# Patient Record
Sex: Male | Born: 1935 | Hispanic: No | Smoking: Former smoker
Health system: Southern US, Community
[De-identification: ages and names within clinical notes are randomized; demographics above are authoritative.]

---

## 2022-02-09 ENCOUNTER — Other Ambulatory Visit: Payer: Self-pay

## 2022-02-09 ENCOUNTER — Emergency Department (HOSPITAL_COMMUNITY)
Admission: EM | Admit: 2022-02-09 | Discharge: 2022-02-10 | Disposition: A | Discharge status: Home / Self Care | Payer: Medicare Other | Attending: Emergency Medicine | Admitting: Emergency Medicine

## 2022-02-09 ENCOUNTER — Encounter (HOSPITAL_COMMUNITY): Payer: Self-pay | Admitting: Emergency Medicine

## 2022-02-09 DIAGNOSIS — T83010A Breakdown (mechanical) of cystostomy catheter, initial encounter: Secondary | ICD-10-CM

## 2022-02-09 DIAGNOSIS — N3000 Acute cystitis without hematuria: Secondary | ICD-10-CM | POA: Diagnosis not present

## 2022-02-09 DIAGNOSIS — D72829 Elevated white blood cell count, unspecified: Secondary | ICD-10-CM | POA: Diagnosis not present

## 2022-02-09 DIAGNOSIS — R140 Abdominal distension (gaseous): Secondary | ICD-10-CM | POA: Insufficient documentation

## 2022-02-09 DIAGNOSIS — T83028A Displacement of other indwelling urethral catheter, initial encounter: Secondary | ICD-10-CM | POA: Insufficient documentation

## 2022-02-09 DIAGNOSIS — D75839 Thrombocytosis, unspecified: Secondary | ICD-10-CM | POA: Diagnosis not present

## 2022-02-09 NOTE — ED Triage Notes (Addendum)
Patient said his suprapubic catheter has not drained for 2 days. He just told his daughter tonight. Patient is having lower abdominal pain. ?

## 2022-02-09 NOTE — ED Notes (Signed)
Bladder scan 619mL.

## 2022-02-09 NOTE — ED Notes (Signed)
Multiple attempts made to flush patient's suprapubic catheter with sterile water per MD Palumbo.  Unsuccessful, meeting resistance with each flush.  MD Palumbo made aware, currently waiting on urology consult.  ?

## 2022-02-10 ENCOUNTER — Emergency Department (HOSPITAL_COMMUNITY): Payer: Medicare Other

## 2022-02-10 LAB — CBC WITH DIFFERENTIAL/PLATELET
Abs Immature Granulocytes: 0.06 10*3/uL (ref 0.00–0.07)
Basophils Absolute: 0.1 10*3/uL (ref 0.0–0.1)
Basophils Relative: 1 %
Eosinophils Absolute: 0.1 10*3/uL (ref 0.0–0.5)
Eosinophils Relative: 1 %
HCT: 39.1 % (ref 39.0–52.0)
Hemoglobin: 13.7 g/dL (ref 13.0–17.0)
Immature Granulocytes: 1 %
Lymphocytes Relative: 27 %
Lymphs Abs: 3.4 10*3/uL (ref 0.7–4.0)
MCH: 28.3 pg (ref 26.0–34.0)
MCHC: 35 g/dL (ref 30.0–36.0)
MCV: 80.8 fL (ref 80.0–100.0)
Monocytes Absolute: 0.9 10*3/uL (ref 0.1–1.0)
Monocytes Relative: 7 %
Neutro Abs: 8.2 10*3/uL — ABNORMAL HIGH (ref 1.7–7.7)
Neutrophils Relative %: 63 %
Platelets: 546 10*3/uL — ABNORMAL HIGH (ref 150–400)
RBC: 4.84 MIL/uL (ref 4.22–5.81)
RDW: 14.1 % (ref 11.5–15.5)
WBC: 12.7 10*3/uL — ABNORMAL HIGH (ref 4.0–10.5)
nRBC: 0 % (ref 0.0–0.2)

## 2022-02-10 LAB — URINALYSIS, ROUTINE W REFLEX MICROSCOPIC
Bilirubin Urine: NEGATIVE
Glucose, UA: NEGATIVE mg/dL
Ketones, ur: NEGATIVE mg/dL
Nitrite: POSITIVE — AB
Protein, ur: 100 mg/dL — AB
RBC / HPF: >50 RBC/hpf — ABNORMAL HIGH (ref 0–5)
Specific Gravity, Urine: 1.012 (ref 1.005–1.030)
WBC, UA: >50 WBC/hpf — ABNORMAL HIGH (ref 0–5)
pH: 6 (ref 5.0–8.0)

## 2022-02-10 LAB — I-STAT CHEM 8, ED
BUN: 16 mg/dL (ref 8–23)
Calcium, Ion: 1.22 mmol/L (ref 1.15–1.40)
Chloride: 99 mmol/L (ref 98–111)
Creatinine, Ser: 1.2 mg/dL (ref 0.61–1.24)
Glucose, Bld: 144 mg/dL — ABNORMAL HIGH (ref 70–99)
HCT: 42 % (ref 39.0–52.0)
Hemoglobin: 14.3 g/dL (ref 13.0–17.0)
Potassium: 3.9 mmol/L (ref 3.5–5.1)
Sodium: 139 mmol/L (ref 135–145)
TCO2: 28 mmol/L (ref 22–32)

## 2022-02-10 MED ORDER — CEFTRIAXONE SODIUM 1 G IJ SOLR
1.0000 g | Freq: Once | INTRAMUSCULAR | Status: AC
  Administered 2022-02-10: 1 g via INTRAMUSCULAR
  Filled 2022-02-10: qty 10

## 2022-02-10 MED ORDER — CEPHALEXIN 500 MG PO CAPS: 500.0000 mg | ORAL_CAPSULE | Freq: Four times a day (QID) | ORAL | 0 refills | Status: DC

## 2022-02-10 NOTE — ED Notes (Signed)
Patient given leg bags and ambulated with a steady gait at d/c.  Patient verbalized understanding on catheter care, and family at bedside verbalized understanding as well.  ?

## 2022-02-10 NOTE — ED Notes (Addendum)
Nurse Atlee Abide from [facility] came to change suprapubic. Patient tolerated change. Cloudy sediment in urine upon urine return. Patient cleaned up and urine sent. ?

## 2022-02-10 NOTE — ED Notes (Signed)
Lab called and agreed to add on urine culture to urine they already have.  ?

## 2022-02-10 NOTE — ED Provider Notes (Signed)
?[facility]-EMERGENCY DEPT ?Provider Note ? ? ?CSN: [id number] ?Arrival date & time: 02/09/22  2307 ? ?  ? ?History ? ?Chief Complaint  ?Patient presents with  ? Suprapubic Catheter Problem  ? ? ?Patrick ApaKenneth C [name] is a 87 y.o. male. ? ?The history is provided by the patient and a relative.  ?Illness ?Location:  Suprapubic catheter ?Quality:  Not draining x 2 days ?Severity:  Moderate ?Onset quality:  Gradual ?Duration:  2 days ?Timing:  Constant ?Progression:  Worsening ?Chronicity:  Recurrent ?Context:  Has had catheter for over a year ?Relieved by:  Nothing ?Worsened by:  Time ?Ineffective treatments:  None tried ?Associated symptoms: no abdominal pain, no fever and no wheezing   ?Risk factors:  Elderly ? ?  ? ?Home Medications ?Prior to Admission medications   ?Medication Sig Start Date End Date Taking? Authorizing Provider  ?cephALEXin (KEFLEX) 500 MG capsule Take 1 capsule (500 mg total) by mouth 4 (four) times daily. 02/10/22  Yes Kylena Mole, MD  ?   ? ?Allergies    ?Patient has no known allergies.   ? ?Review of Systems   ?Review of Systems  ?Constitutional:  Negative for fever.  ?HENT:  Positive for facial swelling.   ?Eyes:  Negative for redness.  ?Respiratory:  Negative for wheezing.   ?Cardiovascular:  Negative for leg swelling.  ?Gastrointestinal:  Negative for abdominal pain.  ?Genitourinary:  Positive for difficulty urinating.  ?Musculoskeletal:  Negative for arthralgias.  ?Psychiatric/Behavioral:  Negative for agitation.   ? ?Physical Exam ?Updated Vital Signs ?BP (!) 141/81   Pulse 72   Temp 97.9 ?F (36.6 ?C) (Oral)   Resp 16   Ht 5\' 8"  (1.727 m)   Wt 73.5 kg   SpO2 100%   BMI 24.63 kg/m?  ?Physical Exam ?Vitals and nursing note reviewed. Exam conducted with a chaperone present.  ?Constitutional:   ?   General: He is not in acute distress. ?   Appearance: Normal appearance.  ?HENT:  ?   Head: Normocephalic and atraumatic.  ?   Nose: Nose normal.  ?Eyes:  ?    Conjunctiva/sclera: Conjunctivae normal.  ?   Pupils: Pupils are equal, round, and reactive to light.  ?Cardiovascular:  ?   Rate and Rhythm: Normal rate and regular rhythm.  ?   Pulses: Normal pulses.  ?   Heart sounds: Normal heart sounds.  ?Pulmonary:  ?   Effort: Pulmonary effort is normal.  ?   Breath sounds: Normal breath sounds.  ?Abdominal:  ?   General: Bowel sounds are normal. There is distension.  ?   Tenderness: There is no abdominal tenderness.  ?Musculoskeletal:     ?   General: Normal range of motion.  ?   Cervical back: Normal range of motion and neck supple.  ?Skin: ?   General: Skin is warm and dry.  ?   Capillary Refill: Capillary refill takes less than 2 seconds.  ?Neurological:  ?   General: No focal deficit present.  ?   Mental Status: He is alert and oriented to person, place, and time.  ?   Deep Tendon Reflexes: Reflexes normal.  ?Psychiatric:     ?   Mood and Affect: Mood normal.     ?   Behavior: Behavior normal.  ? ? ?ED Results / Procedures / Treatments   ?Labs ?(all labs ordered are listed, but only abnormal results are displayed) ?Results for orders placed or performed during the hospital encounter of 02/09/22  ?  CBC with Differential/Platelet  ?Result Value Ref Range  ? WBC 12.7 (H) 4.0 - 10.5 K/uL  ? RBC 4.84 4.22 - 5.81 MIL/uL  ? Hemoglobin 13.7 13.0 - 17.0 g/dL  ? HCT 39.1 39.0 - 52.0 %  ? MCV 80.8 80.0 - 100.0 fL  ? MCH 28.3 26.0 - 34.0 pg  ? MCHC 35.0 30.0 - 36.0 g/dL  ? RDW 14.1 11.5 - 15.5 %  ? Platelets 546 (H) 150 - 400 K/uL  ? nRBC 0.0 0.0 - 0.2 %  ? Neutrophils Relative % 63 %  ? Neutro Abs 8.2 (H) 1.7 - 7.7 K/uL  ? Lymphocytes Relative 27 %  ? Lymphs Abs 3.4 0.7 - 4.0 K/uL  ? Monocytes Relative 7 %  ? Monocytes Absolute 0.9 0.1 - 1.0 K/uL  ? Eosinophils Relative 1 %  ? Eosinophils Absolute 0.1 0.0 - 0.5 K/uL  ? Basophils Relative 1 %  ? Basophils Absolute 0.1 0.0 - 0.1 K/uL  ? Immature Granulocytes 1 %  ? Abs Immature Granulocytes 0.06 0.00 - 0.07 K/uL  ?Urinalysis, Routine  w reflex microscopic Urine, Suprapubic  ?Result Value Ref Range  ? Color, Urine YELLOW YELLOW  ? APPearance CLOUDY (A) CLEAR  ? Specific Gravity, Urine 1.012 1.005 - 1.030  ? pH 6.0 5.0 - 8.0  ? Glucose, UA NEGATIVE NEGATIVE mg/dL  ? Hgb urine dipstick MODERATE (A) NEGATIVE  ? Bilirubin Urine NEGATIVE NEGATIVE  ? Ketones, ur NEGATIVE NEGATIVE mg/dL  ? Protein, ur 100 (A) NEGATIVE mg/dL  ? Nitrite POSITIVE (A) NEGATIVE  ? Leukocytes,Ua LARGE (A) NEGATIVE  ? RBC / HPF >50 (H) 0 - 5 RBC/hpf  ? WBC, UA >50 (H) 0 - 5 WBC/hpf  ? Bacteria, UA RARE (A) NONE SEEN  ? Squamous Epithelial / LPF 0-5 0 - 5  ? WBC Clumps PRESENT   ? Mucus PRESENT   ?I-stat chem 8, ED (not at [facility] or [facility])  ?Result Value Ref Range  ? Sodium 139 135 - 145 mmol/L  ? Potassium 3.9 3.5 - 5.1 mmol/L  ? Chloride 99 98 - 111 mmol/L  ? BUN 16 8 - 23 mg/dL  ? Creatinine, Ser 1.20 0.61 - 1.24 mg/dL  ? Glucose, Bld 144 (H) 70 - 99 mg/dL  ? Calcium, Ion 1.22 1.15 - 1.40 mmol/L  ? TCO2 28 22 - 32 mmol/L  ? Hemoglobin 14.3 13.0 - 17.0 g/dL  ? HCT 42.0 39.0 - 52.0 %  ? ?DG Abdomen 1 View ? ?Result Date: 02/10/2022 ?CLINICAL DATA:  Suprapubic catheter change EXAM: ABDOMEN - 1 VIEW COMPARISON:  None. FINDINGS: Scattered large and small bowel gas is noted. No abnormal mass or abnormal calcifications are seen. No acute bony abnormality is noted. IMPRESSION: No acute abnormality noted. Electronically Signed   By: Alcide Clever M.D.   On: 02/10/2022 02:01   ? ?EKG ?None ? ?Radiology ?DG Abdomen 1 View ? ?Result Date: 02/10/2022 ?CLINICAL DATA:  Suprapubic catheter change EXAM: ABDOMEN - 1 VIEW COMPARISON:  None. FINDINGS: Scattered large and small bowel gas is noted. No abnormal mass or abnormal calcifications are seen. No acute bony abnormality is noted. IMPRESSION: No acute abnormality noted. Electronically Signed   By: Alcide Clever M.D.   On: 02/10/2022 02:01   ? ?Procedures ?Procedures  ? ? ?Medications Ordered in ED ?Medications  ?cefTRIAXone (ROCEPHIN) injection 1 g (1  g Intramuscular Given 02/10/22 0209)  ? ? ?ED Course/ Medical Decision Making/ A&P ?  ?                        ?  Medical Decision Making ?Suprapubic catheter for unknown reason, not drainagin x 2 days  ? ?Amount and/or Complexity of Data Reviewed ?Independent Historian:  ?   Details: daughter see above ?External Data Reviewed: notes. ?   Details: documents on patient has nothing in our system ?Labs: ordered. ?   Details: all labs reviewed by me: normal creatinine 1.2 urinalysis with UTI, slight elevation of white count 12.1, thrombocytosis 546,000 ?Radiology: ordered. ?   Details: KUB without free air ?Discussion of management or test interpretation with external provider(s): Dr. Mena Goes, replace catheter  ? ?Risk ?Prescription drug management. ?Risk Details: Catheter replaced in the ED over 800 cc out.  Will treat for UTI with keflex.  Follow up within a week with urology.  Stable for discharge with close follow up  ? ? ? ?Final Clinical Impression(s) / ED Diagnoses ?Final diagnoses:  ?Suprapubic catheter (HCC)  ?Acute cystitis without hematuria  ? ?Return for intractable cough, coughing up blood, fevers > 100.4 unrelieved by medication, shortness of breath, intractable vomiting, chest pain, shortness of breath, weakness, numbness, changes in speech, facial asymmetry, abdominal pain, passing out, Inability to tolerate liquids or food, cough, altered mental status or any concerns. No signs of systemic illness or infection. The patient is nontoxic-appearing on exam and vital signs are within normal limits.  ?I have reviewed the triage vital signs and the nursing notes. Pertinent labs & imaging results that were available during my care of the patient were reviewed by me and considered in my medical decision making (see chart for details). After history, exam, and medical workup I feel the patient has been appropriately medically screened and is safe for discharge home. Pertinent diagnoses were discussed with the  patient. Patient was given return precautions.  ?Rx / DC Orders ?ED Discharge Orders   ? ?      Ordered  ?  cephALEXin (KEFLEX) 500 MG capsule  4 times daily       ? 02/10/22 0206  ? ?  ?  ? ?  ? ? ?  ?Zhi Geier, MD ?04/0

## 2022-02-11 LAB — URINE CULTURE
Culture: >=100000 — AB
Special Requests: NORMAL

## 2022-04-19 ENCOUNTER — Other Ambulatory Visit: Payer: Self-pay

## 2022-04-19 ENCOUNTER — Emergency Department (HOSPITAL_COMMUNITY)
Admission: EM | Admit: 2022-04-19 | Discharge: 2022-04-20 | Disposition: A | Discharge status: Home / Self Care | Payer: Medicare Other | Attending: Emergency Medicine | Admitting: Emergency Medicine

## 2022-04-19 ENCOUNTER — Encounter (HOSPITAL_COMMUNITY): Payer: Self-pay | Admitting: Emergency Medicine

## 2022-04-19 DIAGNOSIS — T83098A Other mechanical complication of other indwelling urethral catheter, initial encounter: Secondary | ICD-10-CM | POA: Insufficient documentation

## 2022-04-19 DIAGNOSIS — T83010A Breakdown (mechanical) of cystostomy catheter, initial encounter: Secondary | ICD-10-CM

## 2022-04-19 DIAGNOSIS — N3001 Acute cystitis with hematuria: Secondary | ICD-10-CM | POA: Insufficient documentation

## 2022-04-19 DIAGNOSIS — D72829 Elevated white blood cell count, unspecified: Secondary | ICD-10-CM | POA: Diagnosis not present

## 2022-04-19 DIAGNOSIS — R1030 Lower abdominal pain, unspecified: Secondary | ICD-10-CM | POA: Diagnosis present

## 2022-04-19 NOTE — ED Notes (Signed)
Bladder scan showed 77mL of fluid

## 2022-04-19 NOTE — ED Notes (Signed)
Unsuccessful lab draw x2 in right hand. Triage RN aware.

## 2022-04-19 NOTE — ED Provider Triage Note (Addendum)
Emergency Medicine Provider Triage Evaluation Note  Patrick [name] , a 87 y.o. male  was evaluated in triage.  Pt complains of decreased urination.  Patient reports that he has indwelling catheter and has had 1 for the last 3 to 4 years.  When I asked the patient why he has indwelling catheter he tells a long winding story about receiving it after an accident at a nursing facility.  Patient unable to elaborate or provide more details on this.  Patient reports that for the last 1 day he has had pain when attempting to insert catheter into himself.  Patient denies any blood in urine.  Patient denies any fevers.  Patient endorsing lower abdominal pain. Patient bladder scan in triage shows 68mL.  Review of Systems  Positive:  Negative:   Physical Exam  BP (!) 164/75 (BP Location: Right Arm)   Pulse 91   Temp 98.9 F (37.2 C) (Oral)   Resp 17   Ht 5\' 8"  (1.727 m)   Wt 72.6 kg   SpO2 100%   BMI 24.33 kg/m  Gen:   Awake, no distress   Resp:  Normal effort  MSK:   Moves extremities without difficulty  Other:  Distended lower abdomen  Medical Decision Making  Medically screening exam initiated at 8:15 PM.  Appropriate orders placed.  Patrick [name] was informed that the remainder of the evaluation will be completed by another provider, this initial triage assessment does not replace that evaluation, and the importance of remaining in the ED until their evaluation is complete.         Azucena Cecil, PA-C 04/19/22 2031

## 2022-04-19 NOTE — ED Notes (Signed)
Attempted to draw labs x2 unsuccessful. °

## 2022-04-19 NOTE — ED Triage Notes (Signed)
  Patient comes in with lower abdominal pain that has been going on for about a week.  Patient has indwelling urinary catheter that he has been changing for about 3-4 years.  Tried to change catheter yesterday but was hurting too bad.  Patient states it has been draining appropriately but hurts.  Pain 10/10.

## 2022-04-20 LAB — CBC
HCT: 40.6 % (ref 39.0–52.0)
Hemoglobin: 14.1 g/dL (ref 13.0–17.0)
MCH: 29 pg (ref 26.0–34.0)
MCHC: 34.7 g/dL (ref 30.0–36.0)
MCV: 83.5 fL (ref 80.0–100.0)
Platelets: 457 10*3/uL — ABNORMAL HIGH (ref 150–400)
RBC: 4.86 MIL/uL (ref 4.22–5.81)
RDW: 14 % (ref 11.5–15.5)
WBC: 17.3 10*3/uL — ABNORMAL HIGH (ref 4.0–10.5)
nRBC: 0 % (ref 0.0–0.2)

## 2022-04-20 LAB — BASIC METABOLIC PANEL
Anion gap: 9 (ref 5–15)
BUN: 24 mg/dL — ABNORMAL HIGH (ref 8–23)
CO2: 25 mmol/L (ref 22–32)
Calcium: 9.8 mg/dL (ref 8.9–10.3)
Chloride: 103 mmol/L (ref 98–111)
Creatinine, Ser: 1.49 mg/dL — ABNORMAL HIGH (ref 0.61–1.24)
GFR, Estimated: 46 mL/min — ABNORMAL LOW (ref >60)
Glucose, Bld: 165 mg/dL — ABNORMAL HIGH (ref 70–99)
Potassium: 4.3 mmol/L (ref 3.5–5.1)
Sodium: 137 mmol/L (ref 135–145)

## 2022-04-20 LAB — URINALYSIS, ROUTINE W REFLEX MICROSCOPIC
Bilirubin Urine: NEGATIVE
Glucose, UA: NEGATIVE mg/dL
Ketones, ur: NEGATIVE mg/dL
Nitrite: POSITIVE — AB
Protein, ur: 100 mg/dL — AB
Specific Gravity, Urine: 1.012 (ref 1.005–1.030)
WBC, UA: >50 WBC/hpf — ABNORMAL HIGH (ref 0–5)
pH: 6 (ref 5.0–8.0)

## 2022-04-20 MED ORDER — CEFUROXIME AXETIL 250 MG PO TABS: 250.0000 mg | ORAL_TABLET | Freq: Two times a day (BID) | ORAL | 0 refills | Status: AC

## 2022-04-20 NOTE — ED Provider Notes (Signed)
BLADDER CATHETERIZATION  Date/Time: 04/20/2022 1:19 AM  Performed by: Tanvi Gatling, MD Authorized by: Alenna Russell, MD   Consent:    Consent obtained:  Verbal   Consent given by:  Patient   Risks, benefits, and alternatives were discussed: yes   Universal protocol:    Procedure explained and questions answered to patient or proxy's satisfaction: yes     Required blood products, implants, devices, and special equipment available: yes     Immediately prior to procedure, a time out was called: yes     Patient identity confirmed:  Arm band Pre-procedure details:    Procedure purpose:  Therapeutic   Preparation: Patient was prepped and draped in usual sterile fashion   Anesthesia:    Anesthesia method:  None Procedure details:    Provider performed due to:  Altered anatomy   Altered anatomy details: suprapubic catheter.   Catheter insertion:  Indwelling   Catheter type:  Foley   Catheter size:  20 Fr   Bladder irrigation: no     Number of attempts:  1   Urine characteristics:  Cloudy Post-procedure details:    Procedure completion:  Tolerated well, no immediate complications Comments:     Obstructed indwelling catheter removed prior to placement of new catheter.     Giulian Goldring, Jonny Ruiz, MD 04/20/22 [phone]

## 2022-04-20 NOTE — ED Provider Notes (Signed)
[facility]-EMERGENCY DEPT Provider Note   CSN: [id number] Arrival date & time: 04/19/22  1853     History  Chief Complaint  Patient presents with   Abdominal Pain    Patrick [name] is a 87 y.o. male.  HPI  With medical history including suprapubic catheter presents emerged part with complaints of abdominal pain started yesterday, pain is persistent, remains in the lower abdomen does not radiate, endorses that he has been having leaking around his suprapubic catheter, states that he has prostate problems is when he has a suprapubic catheter, states that he has had no urine in it since yesterday, as he was draining around his catheter itself, he denies any nausea or vomiting, states that he cannot remember the last time he had a bowel movement unclear if he is passed gas, he has no other complaints.  Denies any fevers chills general body aches.  Son at bedside able to validate HPI.   Home Medications Prior to Admission medications   Medication Sig Start Date End Date Taking? Authorizing Provider  cefUROXime (CEFTIN) 250 MG tablet Take 1 tablet (250 mg total) by mouth 2 (two) times daily with a meal for 7 days. 04/20/22 04/27/22 Yes Carroll Sage, PA-C  metFORMIN (GLUCOPHAGE) 1000 MG tablet Take 1,000 mg by mouth 2 (two) times daily. 01/05/22  Yes [provider]  TRADJENTA 5 MG TABS tablet Take 5 mg by mouth daily. 01/05/22  Yes [provider]  cephALEXin (KEFLEX) 500 MG capsule Take 1 capsule (500 mg total) by mouth 4 (four) times daily. Patient not taking: Reported on 04/20/2022 02/10/22   Nicanor Alcon, April, MD      Allergies    Patient has no known allergies.    Review of Systems   Review of Systems  Constitutional:  Negative for chills and fever.  Respiratory:  Negative for shortness of breath.   Cardiovascular:  Negative for chest pain.  Gastrointestinal:  Positive for abdominal pain. Negative for nausea, rectal pain and vomiting.   Genitourinary:  Positive for difficulty urinating.  Neurological:  Negative for headaches.    Physical Exam Updated Vital Signs BP (!) 161/92   Pulse 83   Temp 98.6 F (37 C) (Oral)   Resp 18   Ht 5\' 8"  (1.727 m)   Wt 72.6 kg   SpO2 100%   BMI 24.33 kg/m  Physical Exam Vitals and nursing note reviewed.  Constitutional:      General: He is not in acute distress.    Appearance: He is not ill-appearing.  HENT:     Head: Normocephalic and atraumatic.     Nose: No congestion.  Eyes:     Conjunctiva/sclera: Conjunctivae normal.  Cardiovascular:     Rate and Rhythm: Normal rate and regular rhythm.     Pulses: Normal pulses.  Pulmonary:     Effort: Pulmonary effort is normal.  Abdominal:     General: There is distension.     Palpations: Abdomen is soft.     Tenderness: There is abdominal tenderness. There is no right CVA tenderness or left CVA tenderness.     Comments: Suprapubic catheter in place, without surrounding erythema no drainage or discharge present, there was distention noted in the suprapubic region, tender to the touch, with bulging mass present, there is no guarding rebound tenderness or peritoneal sign negative Murphy sign McBurney point.  Skin:    General: Skin is warm and dry.  Neurological:     Mental Status: He  is alert.  Psychiatric:        Mood and Affect: Mood normal.     ED Results / Procedures / Treatments   Labs (all labs ordered are listed, but only abnormal results are displayed) Labs Reviewed  CBC - Abnormal; Notable for the following components:      Result Value   WBC 17.3 (*)    Platelets 457 (*)    All other components within normal limits  BASIC METABOLIC PANEL - Abnormal; Notable for the following components:   Glucose, Bld 165 (*)    BUN 24 (*)    Creatinine, Ser 1.49 (*)    GFR, Estimated 46 (*)    All other components within normal limits  URINALYSIS, ROUTINE W REFLEX MICROSCOPIC - Abnormal; Notable for the following  components:   APPearance CLOUDY (*)    Hgb urine dipstick MODERATE (*)    Protein, ur 100 (*)    Nitrite POSITIVE (*)    Leukocytes,Ua LARGE (*)    WBC, UA >50 (*)    Bacteria, UA RARE (*)    All other components within normal limits  URINE CULTURE    EKG None  Radiology No results found.  Procedures Procedures    Medications Ordered in ED Medications - No data to display  ED Course/ Medical Decision Making/ A&P                           Medical Decision Making Amount and/or Complexity of Data Reviewed Labs: ordered.   This patient presents to the ED for concern of lower abdominal pain, this involves an extensive number of treatment options, and is a complaint that carries with it a high risk of complications and morbidity.  The differential diagnosis includes bowel obstruction, diverticulitis, urinary obstruction    Additional history obtained:  Additional history obtained from son at bedside External records from outside source obtained and reviewed including previous ED note   Co morbidities that complicate the patient evaluation  Suprapubic catheter  Social Determinants of Health:  Geriatric    Lab Tests:  I Ordered, and personally interpreted labs.  The pertinent results include: CBC shows slight leukocytosis of 17.3, BMP shows glucose of 165 BUN 24 creatinine 1.49 baseline appears to be 1.2 GFR 46, UA shows nitrates leukocytes white blood cells rare bacteria urine culture pending   Imaging Studies ordered:  I ordered imaging studies including N/A I independently visualized and interpreted imaging which showed N/A I agree with the radiologist interpretation   Cardiac Monitoring:  The patient was maintained on a cardiac monitor.  I personally viewed and interpreted the cardiac monitored which showed an underlying rhythm of: N/A   Medicines ordered and prescription drug management:  I ordered medication including N/A I have reviewed the  patients home medicines and have made adjustments as needed  Critical Interventions:  N/A   Reevaluation:  Presents with lower abdominal pain on exam he had a bulging mass I suspect likely urinary obstruction, bladder scan showed 681 mL of fluid, suprapubic catheter was removed without difficulty, new catheter was inserted, is draining without difficulty.  Patient notes he feels much better.  Will await lab work.  UA seems consistent with UTI, also correlates with a white count of 17,000, will culture urine and start him on antibiotics.  Patient has drained out a total of 900 cc of fluid, abdomen was soft nontender states he feels better is ready for discharge.  Updated  patient as well as son on lab work they are agreement this plan and they are ready for discharge.   Consultations Obtained:  N/A    Test Considered:  CT and pelvis-this will be deferred to my suspicion for intra-abdominal abnormality is very low at this time, he has a nonsurgical abdomen presentation seems most consistent with bladder obstruction.    Rule out I have low suspicion for liver or gallbladder abnormality as he has no right upper quadrant tenderness.   Low suspicion for ruptured stomach ulcer as she has no peritoneal sign present on exam.  Low suspicion for bowel obstruction as abdomen is nondistended normal bowel sounds.  Low suspicion for complicated diverticulitis as she is nontoxic-appearing, vital signs reassuring.  I have low suspicion for UTI, pyelo-, kidney stone he has no flank tenderness, no CVA tenderness, abdomen soft nontender he is nontoxic-appearing.    Dispostion and problem list  After consideration of the diagnostic results and the patients response to treatment, I feel that the patent would benefit from discharge.  UTI-urine cultures are pending this time, will start him on antibiotics.  We will have him follow-up with urology for further evaluation and strict return  precautions.            Final Clinical Impression(s) / ED Diagnoses Final diagnoses:  Acute cystitis with hematuria  Suprapubic catheter dysfunction, initial encounter [facility])    Rx / DC Orders ED Discharge Orders          Ordered    cefUROXime (CEFTIN) 250 MG tablet  2 times daily with meals        04/20/22 0239              Carroll Sage, PA-C 04/20/22 0240    Molpus, Jonny Ruiz, MD 04/20/22 [phone]

## 2022-04-20 NOTE — Discharge Instructions (Signed)
Lab work shows that you likely have a UTI have started him on antibiotics please take as prescribed  Please follow-up with his urologist for further evaluation  Come back to the emergency department if you develop chest pain, shortness of breath, severe abdominal pain, uncontrolled nausea, vomiting, diarrhea.

## 2022-04-21 LAB — URINE CULTURE

## 2022-10-28 ENCOUNTER — Emergency Department (HOSPITAL_COMMUNITY)
Admission: EM | Admit: 2022-10-28 | Discharge: 2022-10-28 | Discharge status: Against Medical Advice | Payer: Medicare Other | Attending: Emergency Medicine | Admitting: Emergency Medicine

## 2022-10-28 ENCOUNTER — Encounter (HOSPITAL_COMMUNITY): Payer: Self-pay | Admitting: *Deleted

## 2022-10-28 ENCOUNTER — Other Ambulatory Visit: Payer: Self-pay

## 2022-10-28 DIAGNOSIS — Z5329 Procedure and treatment not carried out because of patient's decision for other reasons: Secondary | ICD-10-CM | POA: Insufficient documentation

## 2022-10-28 DIAGNOSIS — R109 Unspecified abdominal pain: Secondary | ICD-10-CM | POA: Diagnosis present

## 2022-10-28 DIAGNOSIS — Y732 Prosthetic and other implants, materials and accessory gastroenterology and urology devices associated with adverse incidents: Secondary | ICD-10-CM | POA: Insufficient documentation

## 2022-10-28 DIAGNOSIS — R1084 Generalized abdominal pain: Secondary | ICD-10-CM

## 2022-10-28 DIAGNOSIS — T83098A Other mechanical complication of other indwelling urethral catheter, initial encounter: Secondary | ICD-10-CM | POA: Diagnosis not present

## 2022-10-28 DIAGNOSIS — T83010A Breakdown (mechanical) of cystostomy catheter, initial encounter: Secondary | ICD-10-CM

## 2022-10-28 LAB — CBC WITH DIFFERENTIAL/PLATELET
Abs Immature Granulocytes: 0.05 10*3/uL (ref 0.00–0.07)
Basophils Absolute: 0 10*3/uL (ref 0.0–0.1)
Basophils Relative: 0 %
Eosinophils Absolute: 0 10*3/uL (ref 0.0–0.5)
Eosinophils Relative: 0 %
HCT: 43.3 % (ref 39.0–52.0)
Hemoglobin: 15.3 g/dL (ref 13.0–17.0)
Immature Granulocytes: 0 %
Lymphocytes Relative: 26 %
Lymphs Abs: 3.5 10*3/uL (ref 0.7–4.0)
MCH: 29.4 pg (ref 26.0–34.0)
MCHC: 35.3 g/dL (ref 30.0–36.0)
MCV: 83.1 fL (ref 80.0–100.0)
Monocytes Absolute: 1.2 10*3/uL — ABNORMAL HIGH (ref 0.1–1.0)
Monocytes Relative: 9 %
Neutro Abs: 8.6 10*3/uL — ABNORMAL HIGH (ref 1.7–7.7)
Neutrophils Relative %: 65 %
Platelets: ADEQUATE 10*3/uL (ref 150–400)
RBC: 5.21 MIL/uL (ref 4.22–5.81)
RDW: 13.3 % (ref 11.5–15.5)
WBC: 13.4 10*3/uL — ABNORMAL HIGH (ref 4.0–10.5)
nRBC: 0 % (ref 0.0–0.2)

## 2022-10-28 LAB — BASIC METABOLIC PANEL
Anion gap: 9 (ref 5–15)
BUN: 23 mg/dL (ref 8–23)
CO2: 23 mmol/L (ref 22–32)
Calcium: 10.1 mg/dL (ref 8.9–10.3)
Chloride: 105 mmol/L (ref 98–111)
Creatinine, Ser: 1.46 mg/dL — ABNORMAL HIGH (ref 0.61–1.24)
GFR, Estimated: 47 mL/min — ABNORMAL LOW (ref >60)
Glucose, Bld: 149 mg/dL — ABNORMAL HIGH (ref 70–99)
Potassium: 4.2 mmol/L (ref 3.5–5.1)
Sodium: 137 mmol/L (ref 135–145)

## 2022-10-28 LAB — URINALYSIS, ROUTINE W REFLEX MICROSCOPIC
Bilirubin Urine: NEGATIVE
Glucose, UA: NEGATIVE mg/dL
Ketones, ur: NEGATIVE mg/dL
Nitrite: NEGATIVE
Protein, ur: 100 mg/dL — AB
Specific Gravity, Urine: 1.011 (ref 1.005–1.030)
pH: 7 (ref 5.0–8.0)

## 2022-10-28 MED ORDER — CEPHALEXIN 250 MG PO CAPS: 500.0000 mg | ORAL_CAPSULE | Freq: Once | ORAL | Status: DC

## 2022-10-28 NOTE — ED Triage Notes (Signed)
Patient c/o pain and supra pubic cath leaking , states he has had the cath x 1 year.

## 2022-10-28 NOTE — ED Provider Notes (Signed)
[name] EM[facility]Y DE[facility]MENT Provider Note   CSN: [id number] Arrival date & time: 10/28/22  4967     History  Chief Complaint  Patient presents with   Abdominal Pain    [name] is a 87 y.o. male with Hx of urinary retention prostate issues and recurrent suprapubic catheters presenting to the ED with abdominal pain and catheter concern.  Accompanied by his son, who provides most of the history.  Patient's son states he got home from work around 11 PM last night and noticed that the patient had urine on his pants and was complaining of abdominal pain.  States the collection bag was empty.  Per pt's son, has them replaced monthly, most recent being 2 weeks ago.  Denies fever, changes in bowel habits, shortness of breath, N/V, or chest pain.  No other complaints at this time.  The history is provided by the patient and medical records.  Abdominal Pain      Home Medications Prior to Admission medications   Medication Sig Start Date End Date Taking? Authorizing Provider  cephALEXin (KEFLEX) 500 MG capsule Take 1 capsule (500 mg total) by mouth 4 (four) times daily. Patient not taking: Reported on 04/20/2022 02/10/22   Palumbo, April, MD  metFORMIN (GLUCOPHAGE) 1000 MG tablet Take 1,000 mg by mouth 2 (two) times daily. 01/05/22   [provider]  TRADJENTA 5 MG TABS tablet Take 5 mg by mouth daily. 01/05/22   [provider]      Allergies    Patient has no known allergies.    Review of Systems   Review of Systems  Gastrointestinal:  Positive for abdominal pain.    Physical Exam Updated Vital Signs BP 128/71 (BP Location: Right Arm)   Pulse 77   Temp 99.4 F (37.4 C) (Oral)   Resp 14   Ht 5\' 8"  (1.727 m)   Wt 77.1 kg   SpO2 100%   BMI 25.85 kg/m  Physical Exam Vitals and nursing note reviewed.  Constitutional:      General: He is not in acute distress.    Appearance: He is well-developed. He is not ill-appearing,  toxic-appearing or diaphoretic.  HENT:     Head: Normocephalic and atraumatic.  Eyes:     Conjunctiva/sclera: Conjunctivae normal.  Cardiovascular:     Rate and Rhythm: Normal rate and regular rhythm.     Heart sounds: No murmur heard. Pulmonary:     Effort: Pulmonary effort is normal. No respiratory distress.     Breath sounds: Normal breath sounds.  Abdominal:     General: There is distension.     Palpations: Abdomen is soft.     Tenderness: There is abdominal tenderness.     Comments: Suprapubic catheter in place, without surrounding erythema.  No drainage or discharge appreciated.  Mild distention of suprapubic region, TTP.  No guarding, rebound tenderness, or peritoneal sign.  Negative Murphy sign or McBurneys point tenderness.  Musculoskeletal:        General: No swelling.     Cervical back: Neck supple.  Skin:    General: Skin is warm and dry.     Capillary Refill: Capillary refill takes less than 2 seconds.  Neurological:     Mental Status: He is alert.  Psychiatric:        Mood and Affect: Mood normal.    ED Results / Procedures / Treatments   Labs (all labs ordered are listed, but only abnormal results are displayed) Labs  Reviewed  URINALYSIS, ROUTINE W REFLEX MICROSCOPIC  CBC WITH DIFFERENTIAL/PLATELET  BASIC METABOLIC PANEL   EKG None  Radiology No results found.  Procedures Procedures    Medications Ordered in ED Medications - No data to display  ED Course/ Medical Decision Making/ A&P Clinical Course as of 10/28/22 1558  Sat Oct 28, 2022  1039 Creatinine(!): 1.46 Close to baseline [AC]    Clinical Course User Index [AC] Patrick Cobbs, PA-C                           Medical Decision Making Amount and/or Complexity of Data Reviewed Labs:  Decision-making details documented in ED Course.   87 y.o. male presents to the ED for concern of Abdominal Pain     This involves an extensive number of treatment options, and is a complaint that  carries with it a high risk of complications and morbidity.  The emergent differential diagnosis prior to evaluation includes, but is not limited to: urinary obstruction, bowel obstruction, diverticulitis  This is not an exhaustive differential.   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  Notably [facility] Dr. Julian [name] visit 08/10/22 and prior ED visit 04/19/22 for similar complaints.  Additional history from son at bedside "Patient in for monthly catheter change per order from Patrick [name] . Order confirmed for 20 F foley catheter. Patient name and DOB verified. Medications and allergies reviewed. Removed 79mL of sterile water from catheter balloon, removed existing foley catheter without difficulty. Cleaned patient with antiseptic towelette. Instilled uroject for comfort.  Inserted 12F foley catheter through the urethra to the bladder using betadine and sterile technique without difficulties. Instilled 32mL of sterile water into balloon, applied catheter support strap, and attached to foley leg bag. Confirmation of placement with of light yellow urine return noted with new catheter insertion. Administered Keflex 500mg  PO x1 per order from , PA. Provided leg bag per patient request. Patient scheduled for one month nurse visit. "  Lab Tests: I ordered, and personally interpreted labs.  The pertinent results include:   WBC 13.4, nonspecific UA from catheter collection with evidence of possible UTI BMP without electrolyte derangement. Mild elevated creatinine, close to value from 6 months ago 1.49  Urine culture pending  Imaging Studies: None  ED Course / Critical Interventions: Pt overall well-appearing on exam.  Nontoxic nonseptic appearing in NAD.  Sitting comfortably.   Presenting with abdominal pain and suprapubic catheter dysfunction.  Around 11 PM last night patient's son noted there was urine leaking outside the catheter, none collecting in the bag, and patient complaining of abdominal pain.  Hx of suprapubic catheter for 1 year, changed monthly with [facility].  Most recently placed about 2 weeks ago per patient's son, present at bedside provides most of history. Afebrile. Mild abdominal distension with tenderness on palpation.  No flank tenderness.  Hemodynamically stable.  Does not meet SIRS or sepsis criteria.  Plan to check renal function with labwork due with reference to extended urinary blockage.  Labs pending.  Catheter replaced by Dr. Rich Number, see separate procedure note for details.  Pt handled this well without complications. Upon reevaluation, pt reports significant improvement.  Urine sent off for culture.  Discussed importance of following up with urologist within the next week. Attempted to provide antibiotic following  catheter change however discovered pt and his son had eloped.  Disposition: Elopement  This patient was a shared case encounter with my attending, Madilyn Hook, who directly contributed to the proposed treatment course and cosigned this note including patient's presenting symptoms, physical exam, and planned diagnostics and interventions.  Attending physician stated agreement with plan or made changes to plan which were implemented.    This chart was dictated using voice recognition software.  Despite best efforts to proofread, errors can occur which can change the documentation meaning.         Final Clinical Impression(s) / ED Diagnoses Final diagnoses:  Suprapubic catheter dysfunction, initial encounter [employer])    Rx / DC Orders ED Discharge Orders     None         Sandrea Hammond 10/28/22 1605    Tilden Fossa, MD 11/01/22 [phone]

## 2022-10-28 NOTE — ED Provider Triage Note (Signed)
Emergency Medicine Provider Triage Evaluation Note  Patrick [name] , a 87 y.o. male  was evaluated in triage.  Pt complains of urine draining around suprapubic catheter.  Follows with urology at [facility] for urinary retention, has ongoing suprapubic catheter, due for exchange in 1 week.  No urine in the bag all day, abdominal pain secondary to sensation of abdominal fullness.  Urine is draining around suprapubic catheter, soaking his clothing.  Review of Systems  Positive: As above Negative: Fevers or chills  Physical Exam  BP 128/71 (BP Location: Right Arm)   Pulse 77   Temp 99.4 F (37.4 C) (Oral)   Resp 14   SpO2 100%  Gen:   Awake, no distress   Resp:  Normal effort  MSK:   Moves extremities without difficulty  Other:  Urine soaked briefs and pants, urine visibly draining around the suprapubic catheter.  No erythema, induration, redness, or tenderness palpation around the catheter.  Mild abdominal distention and tightness to palpation.  Medical Decision Making  Medically screening exam initiated at 4:01 AM.  Appropriate orders placed.  Patrick [name] was informed that the remainder of the evaluation will be completed by another provider, this initial triage assessment does not replace that evaluation, and the importance of remaining in the ED until their evaluation is complete.  Bladder scan ordered.~ 500 cc in the bladder. Charge RN aware.   This chart was dictated using voice recognition software, Dragon. Despite the best efforts of this provider to proofread and correct errors, errors may still occur which can change documentation meaning.    Paris Lore, PA-C 10/28/22 [phone]

## 2022-10-28 NOTE — ED Notes (Signed)
Provider at bedside inserting suprapubic foley with success. Pt reports abdominal relief.

## 2022-10-28 NOTE — ED Notes (Signed)
[name] stated "he is leaving now". This RN informed him labs are pending, providers would like to treat pt appropriately prior to discharging. [name] stated "I have been waiting for 10 hours, I need to go to work and I haven't slept. We have been to this hospital many times and we have the antibiotics and blood pressure medications. [The patient] doesn't take them anyways and he doesn't drink enough water". This RN re-educated pt's son regarding process and need for antibiotics. [name] then stated "If you don't get me a wheelchair, I'll walk him out myself" then proceeded to take patient to exit.

## 2022-10-28 NOTE — ED Provider Notes (Signed)
SUPRAPUBIC TUBE PLACEMENT  Date/Time: 10/28/2022 8:51 AM  Performed by: Tilden Fossa, MD Authorized by: Tilden Fossa, MD   Consent:    Consent obtained:  Verbal   Consent given by:  Patient   Risks discussed:  Infection and pain Universal protocol:    Patient identity confirmed:  Verbally with patient Sedation:    Sedation type:  None Anesthesia:    Anesthesia method:  None Procedure details:    Complexity:  Simple   Catheter type:  Foley   Catheter size:  20 Fr   Ultrasound guidance: no     Number of attempts:  1   Urine characteristics:  Clear Post-procedure details:    Procedure completion:  Tolerated  Suprapubic catheter placed at site of prior catheter that was not draining.  Placed without difficulty.   Tilden Fossa, MD 10/29/22 [phone]

## 2022-10-28 NOTE — ED Notes (Signed)
Pt moved to room 46 for privacy during procedure.

## 2022-10-28 NOTE — Discharge Instructions (Addendum)
You were evaluated in the emergency department today for your suprapubic Foley catheter.  This was changed in the emergency department today.  Please continue to monitor, and call your urologist to schedule your follow-up appointment.  You were also given 1 tablet of 500mg  Keflex.  Return to the ED for any new or worsening symptoms as discussed.

## 2023-01-18 ENCOUNTER — Emergency Department (HOSPITAL_COMMUNITY)
Admission: EM | Admit: 2023-01-18 | Discharge: 2023-01-18 | Disposition: A | Discharge status: Home / Self Care | Payer: Medicare Other | Attending: Emergency Medicine | Admitting: Emergency Medicine

## 2023-01-18 ENCOUNTER — Other Ambulatory Visit: Payer: Self-pay

## 2023-01-18 ENCOUNTER — Encounter (HOSPITAL_COMMUNITY): Payer: Self-pay

## 2023-01-18 DIAGNOSIS — Z87891 Personal history of nicotine dependence: Secondary | ICD-10-CM | POA: Insufficient documentation

## 2023-01-18 DIAGNOSIS — Y828 Other medical devices associated with adverse incidents: Secondary | ICD-10-CM | POA: Insufficient documentation

## 2023-01-18 DIAGNOSIS — T83090A Other mechanical complication of cystostomy catheter, initial encounter: Secondary | ICD-10-CM | POA: Insufficient documentation

## 2023-01-18 DIAGNOSIS — R339 Retention of urine, unspecified: Secondary | ICD-10-CM | POA: Diagnosis present

## 2023-01-18 DIAGNOSIS — T83510A Infection and inflammatory reaction due to cystostomy catheter, initial encounter: Secondary | ICD-10-CM | POA: Insufficient documentation

## 2023-01-18 DIAGNOSIS — N390 Urinary tract infection, site not specified: Secondary | ICD-10-CM

## 2023-01-18 LAB — URINALYSIS, W/ REFLEX TO CULTURE (INFECTION SUSPECTED)
Bilirubin Urine: NEGATIVE
Glucose, UA: NEGATIVE mg/dL
Ketones, ur: NEGATIVE mg/dL
Nitrite: NEGATIVE
Protein, ur: 100 mg/dL — AB
Specific Gravity, Urine: 1.012 (ref 1.005–1.030)
WBC, UA: >50 WBC/hpf (ref 0–5)
pH: 7 (ref 5.0–8.0)

## 2023-01-18 MED ORDER — CEFDINIR 300 MG PO CAPS
600.0000 mg | ORAL_CAPSULE | Freq: Every day | ORAL | Status: DC
  Administered 2023-01-18: 600 mg via ORAL
  Filled 2023-01-18: qty 2

## 2023-01-18 MED ORDER — CEFPODOXIME PROXETIL 200 MG PO TABS: 200.0000 mg | ORAL_TABLET | Freq: Two times a day (BID) | ORAL | 0 refills | Status: AC

## 2023-01-18 NOTE — ED Triage Notes (Signed)
Arrives POV with family.   Reports suprapubic pain and subjective distention with red tinged urine. Family suspect suprapubic catheter clogged or incorrect positioning.

## 2023-01-18 NOTE — ED Provider Notes (Signed)
[facility] DEPT Provider Note: Georgena Spurling, MD, FACEP  CSN: [id number] MRN: [id number] ARRIVAL: 01/18/23 at [place]: [place]  Urinary Retention   HISTORY OF PRESENT ILLNESS  01/18/23 1:21 AM [name] is a 88 y.o. male with an indwelling suprapubic Foley catheter.  He was brought in by son due to pain and distention around the catheter.  It has been draining scant amount of red-tinged urine.  Family suspects suprapubic catheter is clogged.  The patient rates his pain as a 7 out of 10, worse with palpation or movement.   History reviewed. No pertinent past medical history.  History reviewed. No pertinent surgical history.  History reviewed. No pertinent family history.  Social History   Tobacco Use   Smoking status: Former    Types: Cigarettes   Smokeless tobacco: Former  Substance Use Topics   Alcohol use: Never   Drug use: Never    Prior to Admission medications   Medication Sig Start Date End Date Taking? Authorizing Provider  cefpodoxime (VANTIN) 200 MG tablet Take 1 tablet (200 mg total) by mouth 2 (two) times daily for 7 days. 01/18/23 01/25/23 Yes Kitzia Camus, MD  metFORMIN (GLUCOPHAGE) 1000 MG tablet Take 1,000 mg by mouth 2 (two) times daily. 01/05/22   [provider]  TRADJENTA 5 MG TABS tablet Take 5 mg by mouth daily. 01/05/22   [provider]    Allergies Patient has no known allergies.   REVIEW OF SYSTEMS  Negative except as noted here or in the History of Present Illness.   PHYSICAL EXAMINATION  Initial Vital Signs Blood pressure (!) 151/87, pulse 91, temperature 98.5 F (36.9 C), temperature source Oral, resp. rate 18, height '5\' 8"'$  (1.727 m), weight 77.1 kg, SpO2 99 %.  Examination General: Well-developed, well-nourished male in no acute distress; appearance consistent with age of record HENT: normocephalic; atraumatic Eyes: Arcus senilis bilaterally Neck: supple Heart: regular rate and  rhythm Lungs: clear to auscultation bilaterally Abdomen: soft; distended, tender bladder; suprapubic Foley catheter with minimal drainage into leg bag; bowel sounds present Extremities: No deformity; full range of motion; pulses normal Neurologic: Awake, alert; motor function intact in all extremities and symmetric; no facial droop; hard of hearing Skin: Warm and dry Psychiatric: Normal mood and affect   RESULTS  Summary of this visit's results, reviewed and interpreted by myself:   EKG Interpretation  Date/Time:    Ventricular Rate:    PR Interval:    QRS Duration:   QT Interval:    QTC Calculation:   R Axis:     Text Interpretation:         Laboratory Studies: Results for orders placed or performed during the hospital encounter of 01/18/23 (from the past 24 hour(s))  Urinalysis, w/ Reflex to Culture (Infection Suspected) -Urine, Suprapubic     Status: Abnormal   Collection Time: 01/18/23  1:47 AM  Result Value Ref Range   Specimen Source URINE, SUPRAPUBIC    Color, Urine YELLOW YELLOW   APPearance CLOUDY (A) CLEAR   Specific Gravity, Urine 1.012 1.005 - 1.030   pH 7.0 5.0 - 8.0   Glucose, UA NEGATIVE NEGATIVE mg/dL   Hgb urine dipstick MODERATE (A) NEGATIVE   Bilirubin Urine NEGATIVE NEGATIVE   Ketones, ur NEGATIVE NEGATIVE mg/dL   Protein, ur 100 (A) NEGATIVE mg/dL   Nitrite NEGATIVE NEGATIVE   Leukocytes,Ua LARGE (A) NEGATIVE   RBC / HPF 0-5 0 - 5 RBC/hpf   WBC,  UA >50 0 - 5 WBC/hpf   Bacteria, UA MANY (A) NONE SEEN   Squamous Epithelial / HPF 0-5 0 - 5 /HPF   WBC Clumps PRESENT    Imaging Studies: No results found.  ED COURSE and MDM  Nursing notes, initial and subsequent vitals signs, including pulse oximetry, reviewed and interpreted by myself.  Vitals:   01/18/23 0041 01/18/23 0045  BP: (!) 151/87   Pulse: 91   Resp: 18   Temp: 98.5 F (36.9 C)   TempSrc: Oral   SpO2: 99%   Weight:  77.1 kg  Height:  '5\' 8"'$  (1.727 m)   Medications  cefdinir  (OMNICEF) capsule 600 mg (has no administration in time range)    Bedside bladder scan shows approximately 745 mL of retained urine.  Suprapubic Foley catheter replaced as described below.  Good flow restored.  Urine sent for urinalysis.  2:32 AM Urinalysis consistent with urinary tract infection.  Will place patient on an antibiotic.  PROCEDURES   The balloon on the existing Foley catheter was deflated and the catheter withdrawn from the cystostomy.  Copious urine flowed from the stoma.  Once this flow ceased the stoma and surrounding area were cleaned with Betadine and a new Foley catheter was placed as described below:  BLADDER CATHETERIZATION  Date/Time: 01/18/2023 1:47 AM  Performed by: Amyra Vantuyl, MD Authorized by: Imogine Carvell, MD   Consent:    Consent obtained:  Verbal   Consent given by:  Patient   Risks, benefits, and alternatives were discussed: yes   Universal protocol:    Procedure explained and questions answered to patient or proxy's satisfaction: yes     Required blood products, implants, devices, and special equipment available: yes     Immediately prior to procedure, a time out was called: yes     Patient identity confirmed:  Arm band Pre-procedure details:    Procedure purpose:  Therapeutic   Preparation: Patient was prepped and draped in usual sterile fashion   Anesthesia:    Anesthesia method:  None Procedure details:    Provider performed due to:  Altered anatomy   Altered anatomy details: suprapubic cystostomy.   Catheter insertion:  Indwelling   Catheter type:  Foley   Catheter size:  20 Fr   Bladder irrigation: no     Number of attempts:  1   Urine characteristics:  Mildly cloudy Post-procedure details:    Procedure completion:  Tolerated well, no immediate complications  ED DIAGNOSES     ICD-10-CM   1. Obstructed suprapubic catheter, initial encounter ([facility])  T83.090A     2. Urinary tract infection associated with cystostomy catheter, initial  encounter [facility])  T83.510A    N39.0          Anaily Ashbaugh, Jenny Reichmann, MD 01/18/23 [phone]

## 2023-01-18 NOTE — ED Notes (Signed)
Pt pending discharge following medication administration.

## 2023-01-19 LAB — URINE CULTURE

## 2023-05-05 IMAGING — DX DG ABDOMEN 1V
1 series · 1 of 1 positions shown · non-contrast
Comparison: None.

CLINICAL DATA: Suprapubic catheter change

EXAM:
ABDOMEN - 1 VIEW

[abdomen kub]
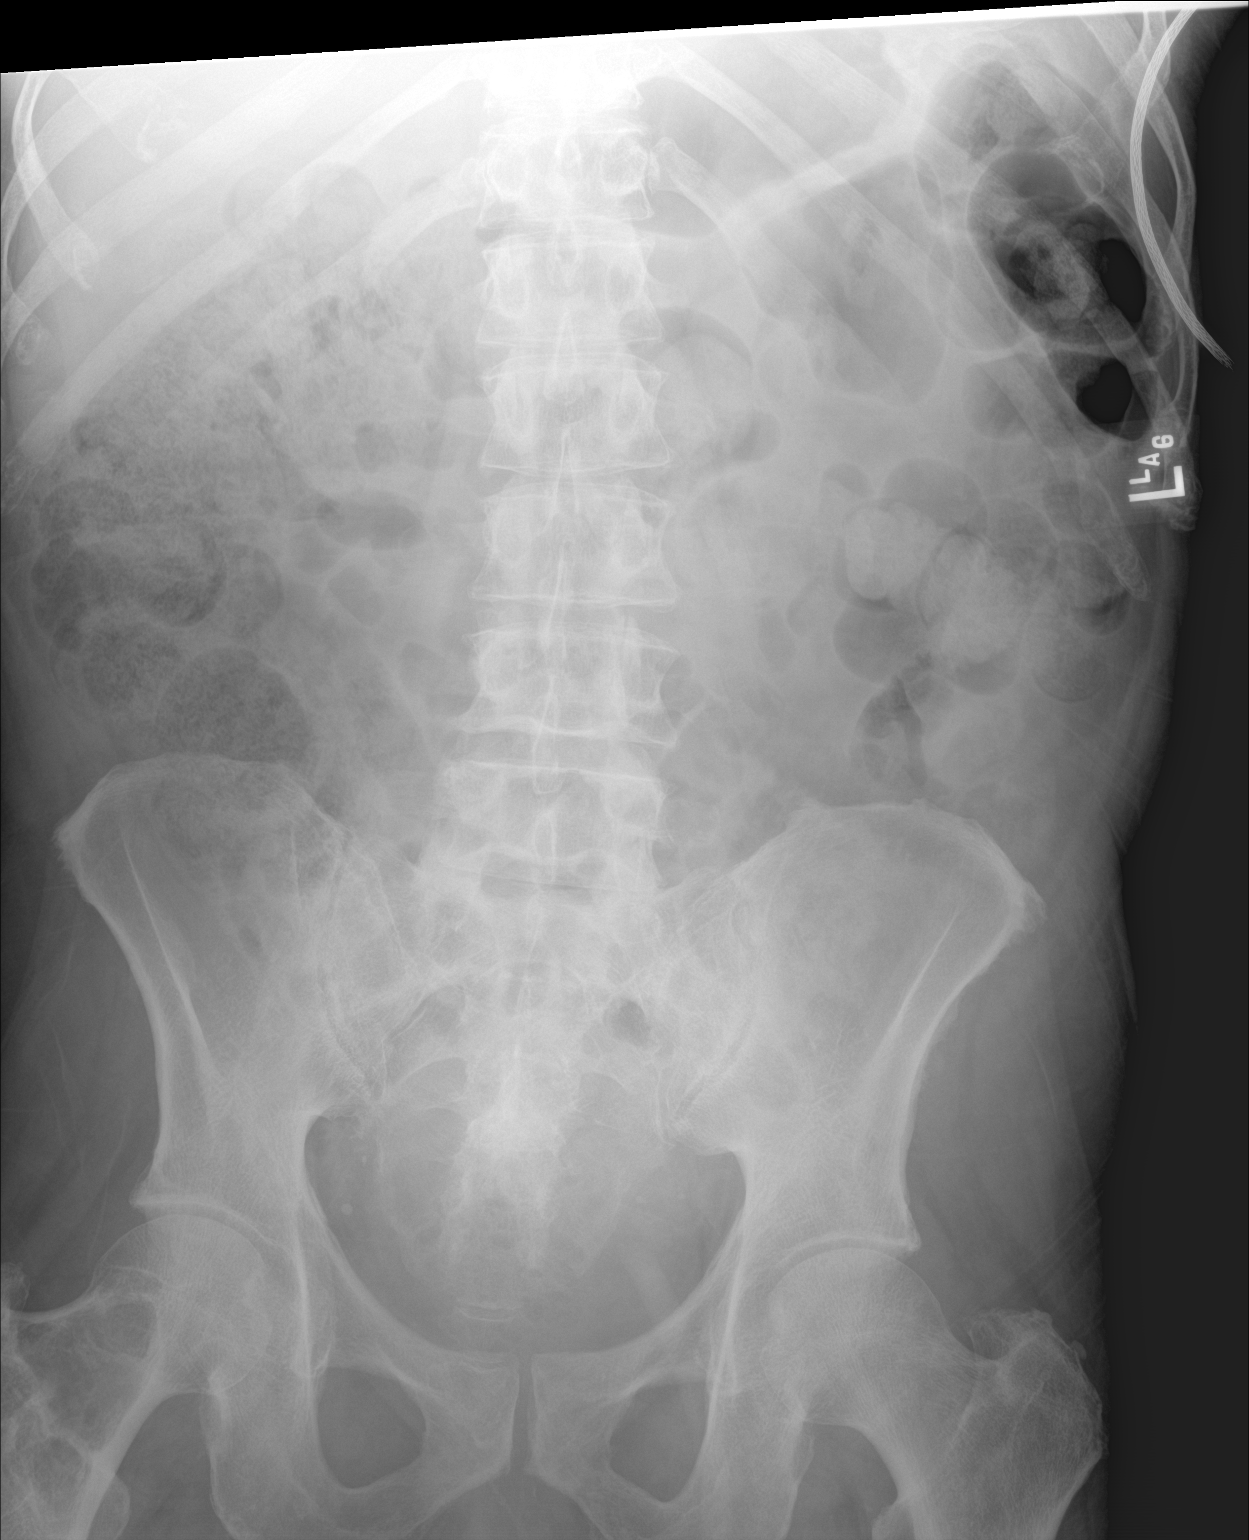

[1 of 1 positions shown; findings below may reference images not displayed]

FINDINGS: Scattered large and small bowel gas is noted. No abnormal mass or
abnormal calcifications are seen. No acute bony abnormality is
noted.
IMPRESSION: No acute abnormality noted.

## 2023-10-23 ENCOUNTER — Emergency Department (HOSPITAL_BASED_OUTPATIENT_CLINIC_OR_DEPARTMENT_OTHER)
Admission: EM | Admit: 2023-10-23 | Discharge: 2023-10-23 | Disposition: A | Discharge status: Home / Self Care | Payer: Medicare Other | Attending: Emergency Medicine | Admitting: Emergency Medicine

## 2023-10-23 ENCOUNTER — Encounter (HOSPITAL_BASED_OUTPATIENT_CLINIC_OR_DEPARTMENT_OTHER): Payer: Self-pay | Admitting: Emergency Medicine

## 2023-10-23 ENCOUNTER — Other Ambulatory Visit: Payer: Self-pay

## 2023-10-23 DIAGNOSIS — R339 Retention of urine, unspecified: Secondary | ICD-10-CM | POA: Diagnosis not present

## 2023-10-23 DIAGNOSIS — R300 Dysuria: Secondary | ICD-10-CM | POA: Diagnosis present

## 2023-10-23 LAB — URINALYSIS, ROUTINE W REFLEX MICROSCOPIC
Bilirubin Urine: NEGATIVE
Glucose, UA: NEGATIVE mg/dL
Ketones, ur: NEGATIVE mg/dL
Nitrite: NEGATIVE
Protein, ur: 100 mg/dL — AB
Specific Gravity, Urine: 1.02 (ref 1.005–1.030)
pH: 8 (ref 5.0–8.0)

## 2023-10-23 LAB — URINALYSIS, MICROSCOPIC (REFLEX)

## 2023-10-23 MED ORDER — CEPHALEXIN 500 MG PO CAPS
500.0000 mg | ORAL_CAPSULE | Freq: Once | ORAL | Status: AC
  Administered 2023-10-23: 500 mg via ORAL
  Filled 2023-10-23: qty 1

## 2023-10-23 MED ORDER — OXYCODONE-ACETAMINOPHEN 5-325 MG PO TABS: 1.0000 | ORAL_TABLET | Freq: Once | ORAL | Status: DC

## 2023-10-23 MED ORDER — CEPHALEXIN 500 MG PO CAPS: 500.0000 mg | ORAL_CAPSULE | Freq: Four times a day (QID) | ORAL | 0 refills | Status: AC

## 2023-10-23 NOTE — ED Notes (Signed)
Greggory Stallion at [facility] notified that patient has been cleared for transport-14:18

## 2023-10-23 NOTE — Consult Note (Signed)
Reason for Consult: Neurogenic Bladder / Clogged SP Tube  Referring Physician: Marguarite Arbour MD  Patrick [name] is an 88 y.o. male.   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 This is recurrent problem. They were denied eval at [facility].   PMH sig for failure to thrive, CAD/Stent, Lives with son at baseline.   Today "Patrick [name]" is seen as urgent consult for above. Several commendable attempts at ER flushing / replacement unsuccessful.     History reviewed. No pertinent past medical history.  History reviewed. No pertinent surgical history.  History reviewed. No pertinent family history.  Social History:  reports that he has quit smoking. His smoking use included cigarettes. He has quit using smokeless tobacco. He reports that he does not drink alcohol and does not use drugs.  Allergies: No Known Allergies  Medications: I have reviewed the patient's current medications.  Results for orders placed or performed during the hospital encounter of 10/23/23 (from the past 48 hours)  Urinalysis, Routine w reflex microscopic -Urine, Suprapubic     Status: Abnormal   Collection Time: 10/23/23 11:37 AM  Result Value Ref Range   Color, Urine STRAW (A) YELLOW   APPearance CLOUDY (A) CLEAR   Specific Gravity, Urine 1.020 1.005 - 1.030   pH 8.0 5.0 - 8.0   Glucose, UA NEGATIVE NEGATIVE mg/dL   Hgb urine dipstick TRACE (A) NEGATIVE   Bilirubin Urine NEGATIVE NEGATIVE   Ketones, ur NEGATIVE NEGATIVE mg/dL   Protein, ur 161 (A) NEGATIVE mg/dL   Nitrite NEGATIVE NEGATIVE   Leukocytes,Ua MODERATE (A) NEGATIVE    Comment: Performed at [facility], [address] [place], [place] [address]  Urinalysis, Microscopic (reflex)     Status: Abnormal   Collection Time: 10/23/23 11:37 AM  Result Value Ref Range   RBC / HPF 0-5 0 - 5 RBC/hpf   WBC, UA  6-10 0 - 5 WBC/hpf   Bacteria, UA MANY (A) NONE SEEN   Squamous Epithelial / HPF 0-5 0 - 5 /HPF   Triple Phosphate Crystal PRESENT     Comment: Performed at [facility], [address] [place], [place], [place] [address]    No results found.  Review of Systems  Genitourinary:  Positive for decreased urine volume.  All other systems reviewed and are negative.  Blood pressure 137/80, pulse 70, temperature 98.2 F (36.8 C), temperature source Oral, resp. rate 18, height 5\' 8"  (1.727 m), weight 77.1 kg, SpO2 98%. Physical Exam Vitals reviewed.  Constitutional:      Comments: Very pleasant, frail. Son at bedside.   Eyes:     Pupils: Pupils are equal, round, and reactive to light.  Cardiovascular:     Rate and Rhythm: Normal rate.  Abdominal:     General: Abdomen is flat.     Comments: Some mild SP distension. SPT partiall in place in what appears abdominal wall displacement.   Genitourinary:    Penis: Normal.   Musculoskeletal:        General: Normal range of motion.     Cervical back: Normal range of motion.  Skin:    General: Skin is warm.  Neurological:     Mental Status: He is alert.  Psychiatric:        Mood and Affect: Mood normal.    BEDSIDE SPT CHANGE:  Verified balloon  deflated with aspiration via balloon port and SPT removed. Numerous calcifications and sediment around eyelets. SPT site prepped with betadine and new 74F SPT placed with copious return cloudy but not foul urine. 10cc sterile water in balloon.   Assessment/Plan:  S/p SP change as per above. Resume monthly changes per [facility] GU provider. Discussed with patient and son strategies to minimize clogging / sediment with daily dilute vinager irrigation (60mL 1:20 white vinager : tap water), taught technique, and given 2 cath-tip syringes.   OK to DC home. Appreciate [facility] and [facility] ER staff.    Loletta Parish. 10/23/2023, 4:19 PM

## 2023-10-23 NOTE — ED Provider Notes (Signed)
[place] EMERGENCY DEPARTMENT AT [facility] Provider Note   CSN: [id number] Arrival date & time: 10/23/23  8657     History  Chief Complaint  Patient presents with   Dysuria    Patrick [name] is a 88 y.o. male.  This is a 88 year old male presents with suprapubic pressure as well as dysuria.  No fever or chills.  No flank pain.  Has a suprapubic catheter secondary to what sounds like urethral stricture.  Notes some urine in his bag but does feel suprapubic pressure.  Last time it was changed was about a month ago      Home Medications Prior to Admission medications   Medication Sig Start Date End Date Taking? Authorizing Provider  metFORMIN (GLUCOPHAGE) 1000 MG tablet Take 1,000 mg by mouth 2 (two) times daily. 01/05/22   [provider]  TRADJENTA 5 MG TABS tablet Take 5 mg by mouth daily. 01/05/22   [provider]      Allergies    Patient has no known allergies.    Review of Systems   Review of Systems  All other systems reviewed and are negative.   Physical Exam Updated Vital Signs BP (!) 147/82 (BP Location: Left Arm)   Pulse 78   Temp 98.4 F (36.9 C) (Oral)   Resp 18   Ht 1.727 m (5\' 8" )   Wt 77.1 kg   SpO2 98%   BMI 25.85 kg/m  Physical Exam Vitals and nursing note reviewed.  Constitutional:      General: He is not in acute distress.    Appearance: Normal appearance. He is well-developed. He is not toxic-appearing.  HENT:     Head: Normocephalic and atraumatic.  Eyes:     General: Lids are normal.     Conjunctiva/sclera: Conjunctivae normal.     Pupils: Pupils are equal, round, and reactive to light.  Neck:     Thyroid: No thyroid mass.     Trachea: No tracheal deviation.  Cardiovascular:     Rate and Rhythm: Normal rate and regular rhythm.     Heart sounds: Normal heart sounds. No murmur heard.    No gallop.  Pulmonary:     Effort: Pulmonary effort is normal. No respiratory distress.     Breath sounds:  Normal breath sounds. No stridor. No decreased breath sounds, wheezing, rhonchi or rales.  Abdominal:     General: There is no distension.     Palpations: Abdomen is soft.     Tenderness: There is abdominal tenderness in the suprapubic area. There is no rebound.    Musculoskeletal:        General: No tenderness. Normal range of motion.     Cervical back: Normal range of motion and neck supple.  Skin:    General: Skin is warm and dry.     Findings: No abrasion or rash.  Neurological:     Mental Status: He is alert and oriented to person, place, and time. Mental status is at baseline.     GCS: GCS eye subscore is 4. GCS verbal subscore is 5. GCS motor subscore is 6.     Cranial Nerves: No cranial nerve deficit.     Sensory: No sensory deficit.     Motor: Motor function is intact.  Psychiatric:        Attention and Perception: Attention normal.        Speech: Speech normal.        Behavior: Behavior normal.  ED Results / Procedures / Treatments   Labs (all labs ordered are listed, but only abnormal results are displayed) Labs Reviewed  URINALYSIS, ROUTINE W REFLEX MICROSCOPIC    EKG None  Radiology No results found.  Procedures Procedures    Medications Ordered in ED Medications - No data to display  ED Course/ Medical Decision Making/ A&P                                 Medical Decision Making Amount and/or Complexity of Data Reviewed Labs: ordered.   Attempted to change patient's suprapubic catheter without success.  Urinalysis shows 6-10 white blood cells.  Patient will be treated likely for UTI as he is symptomatic.  Discussed with Dr. Urban Gibson from urology who will see the patient in the [facility] long ED.  Patient accepted by Dr. Renaye Rakers        Final Clinical Impression(s) / ED Diagnoses Final diagnoses:  None    Rx / DC Orders ED Discharge Orders     None         Lorre Nick, MD 10/23/23 1309

## 2023-10-23 NOTE — ED Notes (Signed)
Thomas at [employer] notified of transport-13:32

## 2023-10-23 NOTE — ED Notes (Signed)
RN attempted to irrigate patient's catheter but was unsuccessful. Provider made aware.

## 2023-10-23 NOTE — Discharge Instructions (Addendum)
Follow up with your normal urologist  We prescribed antibiotics for possible urinary infection.

## 2023-10-23 NOTE — ED Provider Notes (Signed)
88-year male with a history urethral stricture and chronic suprapubic Foley catheter for approximately 3 years according to his son at bedside, presenting to the ED as a [facility] with concern for potential suprapubic dysfunction.  The catheter was attempted to be exchanged over a [facility] but they were not successful.  They are not able to excessively flush the catheter.  Patient is having suprapubic discomfort.  There is yellow urine in the bag that his son reports he drained prior to transport over to the ED.  Patient was planned to be treated for UTI based on urinalysis results  4 pm - dr Berneice Heinrich saw patient and exchanged catheter, okay for discharge   Jeffre Enriques, Kermit Balo, MD 10/23/23 [id number]

## 2023-10-23 NOTE — ED Notes (Signed)
RN and MD attempted to remove and replace catheter but was unsuccessful d/t trouble removing catheter. RN was only able to remove 1ml out of balloon.

## 2023-10-23 NOTE — ED Triage Notes (Signed)
C/o pain in and around insertion site of suprapubic catheter last night. Reports burning when trying to urinate. Denies any injuries or trauma to area. Last changed approx 1 month ago.

## 2023-10-24 LAB — URINE CULTURE

## 2024-09-09 ENCOUNTER — Encounter: Payer: Self-pay | Admitting: Podiatry

## 2024-09-09 ENCOUNTER — Ambulatory Visit: Admitting: Podiatry

## 2024-09-09 VITALS — Ht 68.0 in | Wt 170.0 lb

## 2024-09-09 DIAGNOSIS — B351 Tinea unguium: Secondary | ICD-10-CM

## 2024-09-09 DIAGNOSIS — E1140 Type 2 diabetes mellitus with diabetic neuropathy, unspecified: Secondary | ICD-10-CM | POA: Diagnosis not present

## 2024-09-09 DIAGNOSIS — M79675 Pain in left toe(s): Secondary | ICD-10-CM

## 2024-09-09 DIAGNOSIS — L97512 Non-pressure chronic ulcer of other part of right foot with fat layer exposed: Secondary | ICD-10-CM | POA: Diagnosis not present

## 2024-09-09 DIAGNOSIS — M79674 Pain in right toe(s): Secondary | ICD-10-CM

## 2024-09-09 DIAGNOSIS — I739 Peripheral vascular disease, unspecified: Secondary | ICD-10-CM | POA: Diagnosis not present

## 2024-09-09 MED ORDER — GENTAMICIN SULFATE 0.1 % EX OINT: 1.0000 | TOPICAL_OINTMENT | Freq: Every day | CUTANEOUS | 0 refills | Status: DC

## 2024-09-09 NOTE — Progress Notes (Signed)
  Subjective:  Patient ID: Patrick [name], male    DOB: June 30, 1935,  MRN: [id number]  Chief Complaint  Patient presents with   Nail Problem    Rm 3 Patient is here for bilateral onychomycosis, nail trim and sore on lateral side of right foot.    89 y.o. male presents with the above complaint. History confirmed with patient.  He has diabetes on metformin.  Last A1c 7.8%.  Here with his family member.  Also has a sore spot on the lateral foot.  Objective:  Physical Exam: DP reduced bilateral, PT reduced bilateral, reduced sensation at distal toes and plantar forefoot, and trophic changes noted with +1 pitting edema minimal pedal hair growth.  Feet have thin shiny atrophic skin. Left Foot: dystrophic yellowed discolored nail plates with subungual debris Right Foot: dystrophic yellowed discolored nail plates with subungual debris and full-thickness ulcer lateral fifth metatarsal base with exposed subcutaneous tissue 5 mm in diameter and surrounding hyperkeratosis no purulence or malodor    Assessment:   1. Pain due to onychomycosis of toenails of both feet   2. PAD (peripheral artery disease)   3. Type 2 diabetes mellitus with diabetic neuropathy, without long-term current use of insulin (HCC)   4. Ulcer of right foot with fat layer exposed (HCC)      Plan:  Patient was evaluated and treated and all questions answered.  Patient educated on diabetes. Discussed proper diabetic foot care and discussed risks and complications of disease. Educated patient in depth on reasons to return to the office immediately should he/she discover anything concerning or new on the feet. All questions answered. Discussed proper shoes as well.   Discussed the etiology and treatment options for the condition in detail with the patient. Recommended debridement of the nails today. Sharp and mechanical debridement performed of all painful and mycotic nails today. Nails debrided in length and thickness using a  nail nipper to level of comfort. Follow up as needed for painful nails.  Has an ulceration on the lateral fifth metatarsal base I debrided the overlying tissue and this was carried to the level of the subcutaneous tissue.  Dressed with Silvadene and bandage.  Rx for gentamicin sent to pharmacy.  Recommended noninvasive vascular testing to evaluate his peripheral circulation.  Referral placed and they will schedule  Return in about 4 weeks (around 10/07/2024) for wound care.

## 2024-09-09 NOTE — Patient Instructions (Signed)
 Call to schedule your vascular testing:   [facility] Jeralene Mom. [facility] [address] [place],  [place]  [address]   Main: [phone]

## 2024-09-29 ENCOUNTER — Encounter (HOSPITAL_COMMUNITY): Payer: Self-pay

## 2024-10-07 ENCOUNTER — Ambulatory Visit: Admitting: Podiatry

## 2024-10-14 ENCOUNTER — Ambulatory Visit: Admitting: Podiatry

## 2024-11-03 ENCOUNTER — Emergency Department (HOSPITAL_COMMUNITY)

## 2024-11-03 ENCOUNTER — Inpatient Hospital Stay (HOSPITAL_COMMUNITY)
Admission: EM | Admit: 2024-11-03 | Discharge: 2024-11-19 | DRG: 854 | Disposition: A | Discharge status: Hospice | Attending: Internal Medicine | Admitting: Internal Medicine

## 2024-11-03 DIAGNOSIS — E11621 Type 2 diabetes mellitus with foot ulcer: Secondary | ICD-10-CM | POA: Diagnosis present

## 2024-11-03 DIAGNOSIS — M86271 Subacute osteomyelitis, right ankle and foot: Secondary | ICD-10-CM | POA: Diagnosis present

## 2024-11-03 DIAGNOSIS — Z87891 Personal history of nicotine dependence: Secondary | ICD-10-CM

## 2024-11-03 DIAGNOSIS — K5900 Constipation, unspecified: Secondary | ICD-10-CM | POA: Diagnosis not present

## 2024-11-03 DIAGNOSIS — D75839 Thrombocytosis, unspecified: Secondary | ICD-10-CM | POA: Diagnosis present

## 2024-11-03 DIAGNOSIS — L8990 Pressure ulcer of unspecified site, unspecified stage: Secondary | ICD-10-CM | POA: Insufficient documentation

## 2024-11-03 DIAGNOSIS — R1310 Dysphagia, unspecified: Secondary | ICD-10-CM | POA: Diagnosis present

## 2024-11-03 DIAGNOSIS — F0394 Unspecified dementia, unspecified severity, with anxiety: Secondary | ICD-10-CM | POA: Diagnosis present

## 2024-11-03 DIAGNOSIS — Z89512 Acquired absence of left leg below knee: Secondary | ICD-10-CM

## 2024-11-03 DIAGNOSIS — E11628 Type 2 diabetes mellitus with other skin complications: Secondary | ICD-10-CM | POA: Diagnosis present

## 2024-11-03 DIAGNOSIS — M609 Myositis, unspecified: Secondary | ICD-10-CM | POA: Diagnosis present

## 2024-11-03 DIAGNOSIS — E872 Acidosis, unspecified: Secondary | ICD-10-CM | POA: Diagnosis present

## 2024-11-03 DIAGNOSIS — N401 Enlarged prostate with lower urinary tract symptoms: Secondary | ICD-10-CM | POA: Diagnosis present

## 2024-11-03 DIAGNOSIS — Z66 Do not resuscitate: Secondary | ICD-10-CM | POA: Diagnosis present

## 2024-11-03 DIAGNOSIS — L97419 Non-pressure chronic ulcer of right heel and midfoot with unspecified severity: Secondary | ICD-10-CM | POA: Diagnosis present

## 2024-11-03 DIAGNOSIS — R6510 Systemic inflammatory response syndrome (SIRS) of non-infectious origin without acute organ dysfunction: Principal | ICD-10-CM

## 2024-11-03 DIAGNOSIS — E1140 Type 2 diabetes mellitus with diabetic neuropathy, unspecified: Secondary | ICD-10-CM | POA: Diagnosis present

## 2024-11-03 DIAGNOSIS — R338 Other retention of urine: Secondary | ICD-10-CM | POA: Diagnosis present

## 2024-11-03 DIAGNOSIS — E1151 Type 2 diabetes mellitus with diabetic peripheral angiopathy without gangrene: Secondary | ICD-10-CM | POA: Diagnosis present

## 2024-11-03 DIAGNOSIS — A419 Sepsis, unspecified organism: Principal | ICD-10-CM | POA: Diagnosis present

## 2024-11-03 DIAGNOSIS — E663 Overweight: Secondary | ICD-10-CM | POA: Diagnosis present

## 2024-11-03 DIAGNOSIS — D649 Anemia, unspecified: Secondary | ICD-10-CM | POA: Diagnosis present

## 2024-11-03 DIAGNOSIS — M869 Osteomyelitis, unspecified: Secondary | ICD-10-CM

## 2024-11-03 DIAGNOSIS — Z79899 Other long term (current) drug therapy: Secondary | ICD-10-CM

## 2024-11-03 DIAGNOSIS — L97429 Non-pressure chronic ulcer of left heel and midfoot with unspecified severity: Secondary | ICD-10-CM | POA: Diagnosis present

## 2024-11-03 DIAGNOSIS — Z7984 Long term (current) use of oral hypoglycemic drugs: Secondary | ICD-10-CM

## 2024-11-03 DIAGNOSIS — F03918 Unspecified dementia, unspecified severity, with other behavioral disturbance: Secondary | ICD-10-CM | POA: Diagnosis present

## 2024-11-03 DIAGNOSIS — Z515 Encounter for palliative care: Secondary | ICD-10-CM

## 2024-11-03 DIAGNOSIS — Z7982 Long term (current) use of aspirin: Secondary | ICD-10-CM

## 2024-11-03 DIAGNOSIS — D509 Iron deficiency anemia, unspecified: Secondary | ICD-10-CM | POA: Diagnosis present

## 2024-11-03 DIAGNOSIS — L02612 Cutaneous abscess of left foot: Secondary | ICD-10-CM | POA: Diagnosis present

## 2024-11-03 DIAGNOSIS — Z7189 Other specified counseling: Secondary | ICD-10-CM

## 2024-11-03 DIAGNOSIS — Z7902 Long term (current) use of antithrombotics/antiplatelets: Secondary | ICD-10-CM

## 2024-11-03 DIAGNOSIS — E1169 Type 2 diabetes mellitus with other specified complication: Secondary | ICD-10-CM | POA: Diagnosis present

## 2024-11-03 DIAGNOSIS — Z6827 Body mass index (BMI) 27.0-27.9, adult: Secondary | ICD-10-CM

## 2024-11-03 DIAGNOSIS — K869 Disease of pancreas, unspecified: Secondary | ICD-10-CM | POA: Diagnosis present

## 2024-11-03 DIAGNOSIS — L03116 Cellulitis of left lower limb: Secondary | ICD-10-CM | POA: Diagnosis present

## 2024-11-03 DIAGNOSIS — E8809 Other disorders of plasma-protein metabolism, not elsewhere classified: Secondary | ICD-10-CM | POA: Diagnosis present

## 2024-11-03 DIAGNOSIS — E871 Hypo-osmolality and hyponatremia: Secondary | ICD-10-CM | POA: Diagnosis present

## 2024-11-03 DIAGNOSIS — I70223 Atherosclerosis of native arteries of extremities with rest pain, bilateral legs: Secondary | ICD-10-CM | POA: Diagnosis present

## 2024-11-03 LAB — CBC WITH DIFFERENTIAL/PLATELET
Abs Immature Granulocytes: 0.15 K/uL — ABNORMAL HIGH (ref 0.00–0.07)
Basophils Absolute: 0.1 K/uL (ref 0.0–0.1)
Basophils Relative: 0 %
Eosinophils Absolute: 0.1 K/uL (ref 0.0–0.5)
Eosinophils Relative: 1 %
HCT: 37.5 % — ABNORMAL LOW (ref 39.0–52.0)
Hemoglobin: 12.7 g/dL — ABNORMAL LOW (ref 13.0–17.0)
Immature Granulocytes: 1 %
Lymphocytes Relative: 23 %
Lymphs Abs: 4.6 K/uL — ABNORMAL HIGH (ref 0.7–4.0)
MCH: 27.4 pg (ref 26.0–34.0)
MCHC: 33.9 g/dL (ref 30.0–36.0)
MCV: 81 fL (ref 80.0–100.0)
Monocytes Absolute: 1.2 K/uL — ABNORMAL HIGH (ref 0.1–1.0)
Monocytes Relative: 6 %
Neutro Abs: 14.1 K/uL — ABNORMAL HIGH (ref 1.7–7.7)
Neutrophils Relative %: 69 %
Platelets: 733 K/uL — ABNORMAL HIGH (ref 150–400)
RBC: 4.63 MIL/uL (ref 4.22–5.81)
RDW: 15.2 % (ref 11.5–15.5)
WBC: 20.2 K/uL — ABNORMAL HIGH (ref 4.0–10.5)
nRBC: 0 % (ref 0.0–0.2)

## 2024-11-03 LAB — COMPREHENSIVE METABOLIC PANEL WITH GFR
ALT: <5 U/L (ref 0–44)
AST: 17 U/L (ref 15–41)
Albumin: 3.9 g/dL (ref 3.5–5.0)
Alkaline Phosphatase: 76 U/L (ref 38–126)
Anion gap: 12 (ref 5–15)
BUN: 20 mg/dL (ref 8–23)
CO2: 21 mmol/L — ABNORMAL LOW (ref 22–32)
Calcium: 9.1 mg/dL (ref 8.9–10.3)
Chloride: 101 mmol/L (ref 98–111)
Creatinine, Ser: 1.13 mg/dL (ref 0.61–1.24)
GFR, Estimated: >60 mL/min
Glucose, Bld: 128 mg/dL — ABNORMAL HIGH (ref 70–99)
Potassium: 4.1 mmol/L (ref 3.5–5.1)
Sodium: 133 mmol/L — ABNORMAL LOW (ref 135–145)
Total Bilirubin: 0.5 mg/dL (ref 0.0–1.2)
Total Protein: 8.5 g/dL — ABNORMAL HIGH (ref 6.5–8.1)

## 2024-11-03 MED ORDER — ACETAMINOPHEN 500 MG PO TABS
1000.0000 mg | ORAL_TABLET | Freq: Once | ORAL | Status: AC
  Administered 2024-11-03: 1000 mg via ORAL
  Filled 2024-11-03: qty 2

## 2024-11-03 NOTE — ED Triage Notes (Signed)
 Patient is complaining of bilateral foot pain x 2 weeks. Swelling to left foot and lower leg. Left leg has been swelling x 1 week. Painful to move. Denies any hx of blood clots and does not take any blood thinners. Rates pain 10/10.

## 2024-11-03 NOTE — ED Provider Triage Note (Signed)
 Emergency Medicine Provider Triage Evaluation Note  ARIEZ NEILAN , a 89 y.o. male  was evaluated in triage.  Pt complains of left leg pain and swelling x 2 weeks.  Patient states he has a small ulcer on the bottom of his left foot that is erythematous and warm to the touch.  Patient notes that since he noticed the ulcer, the swelling has moved up his leg.  Patient denies any neurological symptoms such as numbness, tingling, or weakness.  Patient is followed by podiatry.  Patient is in no acute distress.  Review of Systems  Positive: Left leg swelling, ulcer Negative: Fevers  Physical Exam  BP 119/76 (BP Location: Left Arm)   Pulse 75   Temp 99.6 F (37.6 C) (Oral)   Resp 16   SpO2 100%  Gen:   Awake, no distress   Resp:  Normal effort  MSK:   Moves extremities without difficulty  Other:  Small calcaneal ulcer noted to patient's left foot.  Neurovascular intact.  Medical Decision Making  Medically screening exam initiated at 5:59 PM.  Appropriate orders placed.  JAIMEN MELONE was informed that the remainder of the evaluation will be completed by another provider, this initial triage assessment does not replace that evaluation, and the importance of remaining in the ED until their evaluation is complete.    Willma Duwaine CROME, [place] 11/03/24 1815

## 2024-11-04 DIAGNOSIS — L03116 Cellulitis of left lower limb: Secondary | ICD-10-CM | POA: Diagnosis present

## 2024-11-04 DIAGNOSIS — E1140 Type 2 diabetes mellitus with diabetic neuropathy, unspecified: Secondary | ICD-10-CM | POA: Diagnosis present

## 2024-11-04 DIAGNOSIS — A419 Sepsis, unspecified organism: Secondary | ICD-10-CM | POA: Diagnosis present

## 2024-11-04 DIAGNOSIS — D649 Anemia, unspecified: Secondary | ICD-10-CM | POA: Diagnosis not present

## 2024-11-04 DIAGNOSIS — L02612 Cutaneous abscess of left foot: Secondary | ICD-10-CM | POA: Diagnosis present

## 2024-11-04 DIAGNOSIS — I7090 Unspecified atherosclerosis: Secondary | ICD-10-CM | POA: Diagnosis not present

## 2024-11-04 DIAGNOSIS — L97519 Non-pressure chronic ulcer of other part of right foot with unspecified severity: Secondary | ICD-10-CM | POA: Diagnosis not present

## 2024-11-04 DIAGNOSIS — M869 Osteomyelitis, unspecified: Secondary | ICD-10-CM | POA: Diagnosis not present

## 2024-11-04 DIAGNOSIS — L97429 Non-pressure chronic ulcer of left heel and midfoot with unspecified severity: Secondary | ICD-10-CM | POA: Diagnosis present

## 2024-11-04 DIAGNOSIS — Z79899 Other long term (current) drug therapy: Secondary | ICD-10-CM | POA: Diagnosis not present

## 2024-11-04 DIAGNOSIS — F0390 Unspecified dementia without behavioral disturbance: Secondary | ICD-10-CM | POA: Diagnosis not present

## 2024-11-04 DIAGNOSIS — E8809 Other disorders of plasma-protein metabolism, not elsewhere classified: Secondary | ICD-10-CM | POA: Diagnosis present

## 2024-11-04 DIAGNOSIS — E872 Acidosis, unspecified: Secondary | ICD-10-CM | POA: Diagnosis present

## 2024-11-04 DIAGNOSIS — Z515 Encounter for palliative care: Secondary | ICD-10-CM | POA: Diagnosis not present

## 2024-11-04 DIAGNOSIS — Z7902 Long term (current) use of antithrombotics/antiplatelets: Secondary | ICD-10-CM | POA: Diagnosis not present

## 2024-11-04 DIAGNOSIS — M86172 Other acute osteomyelitis, left ankle and foot: Secondary | ICD-10-CM | POA: Diagnosis not present

## 2024-11-04 DIAGNOSIS — Z7189 Other specified counseling: Secondary | ICD-10-CM | POA: Diagnosis not present

## 2024-11-04 DIAGNOSIS — L0390 Cellulitis, unspecified: Secondary | ICD-10-CM | POA: Diagnosis not present

## 2024-11-04 DIAGNOSIS — D75839 Thrombocytosis, unspecified: Secondary | ICD-10-CM | POA: Diagnosis present

## 2024-11-04 DIAGNOSIS — D509 Iron deficiency anemia, unspecified: Secondary | ICD-10-CM | POA: Diagnosis present

## 2024-11-04 DIAGNOSIS — E1169 Type 2 diabetes mellitus with other specified complication: Secondary | ICD-10-CM | POA: Diagnosis not present

## 2024-11-04 DIAGNOSIS — E1151 Type 2 diabetes mellitus with diabetic peripheral angiopathy without gangrene: Secondary | ICD-10-CM | POA: Diagnosis present

## 2024-11-04 DIAGNOSIS — F03918 Unspecified dementia, unspecified severity, with other behavioral disturbance: Secondary | ICD-10-CM | POA: Diagnosis present

## 2024-11-04 DIAGNOSIS — R1310 Dysphagia, unspecified: Secondary | ICD-10-CM | POA: Diagnosis present

## 2024-11-04 DIAGNOSIS — M86171 Other acute osteomyelitis, right ankle and foot: Secondary | ICD-10-CM | POA: Diagnosis not present

## 2024-11-04 DIAGNOSIS — Z9889 Other specified postprocedural states: Secondary | ICD-10-CM | POA: Diagnosis not present

## 2024-11-04 DIAGNOSIS — I70201 Unspecified atherosclerosis of native arteries of extremities, right leg: Secondary | ICD-10-CM | POA: Diagnosis not present

## 2024-11-04 DIAGNOSIS — M86271 Subacute osteomyelitis, right ankle and foot: Secondary | ICD-10-CM | POA: Diagnosis present

## 2024-11-04 DIAGNOSIS — R338 Other retention of urine: Secondary | ICD-10-CM | POA: Diagnosis present

## 2024-11-04 DIAGNOSIS — Z7982 Long term (current) use of aspirin: Secondary | ICD-10-CM | POA: Diagnosis not present

## 2024-11-04 DIAGNOSIS — M7989 Other specified soft tissue disorders: Secondary | ICD-10-CM | POA: Diagnosis present

## 2024-11-04 DIAGNOSIS — S91301A Unspecified open wound, right foot, initial encounter: Secondary | ICD-10-CM | POA: Diagnosis not present

## 2024-11-04 DIAGNOSIS — Z66 Do not resuscitate: Secondary | ICD-10-CM | POA: Diagnosis present

## 2024-11-04 DIAGNOSIS — I70244 Atherosclerosis of native arteries of left leg with ulceration of heel and midfoot: Secondary | ICD-10-CM | POA: Diagnosis not present

## 2024-11-04 DIAGNOSIS — S91302A Unspecified open wound, left foot, initial encounter: Secondary | ICD-10-CM | POA: Diagnosis not present

## 2024-11-04 DIAGNOSIS — R400 Somnolence: Secondary | ICD-10-CM | POA: Diagnosis not present

## 2024-11-04 DIAGNOSIS — E871 Hypo-osmolality and hyponatremia: Secondary | ICD-10-CM | POA: Diagnosis present

## 2024-11-04 DIAGNOSIS — L97419 Non-pressure chronic ulcer of right heel and midfoot with unspecified severity: Secondary | ICD-10-CM | POA: Diagnosis present

## 2024-11-04 DIAGNOSIS — I70235 Atherosclerosis of native arteries of right leg with ulceration of other part of foot: Secondary | ICD-10-CM | POA: Diagnosis not present

## 2024-11-04 DIAGNOSIS — I70223 Atherosclerosis of native arteries of extremities with rest pain, bilateral legs: Secondary | ICD-10-CM | POA: Diagnosis present

## 2024-11-04 DIAGNOSIS — K869 Disease of pancreas, unspecified: Secondary | ICD-10-CM | POA: Diagnosis not present

## 2024-11-04 DIAGNOSIS — N401 Enlarged prostate with lower urinary tract symptoms: Secondary | ICD-10-CM | POA: Diagnosis present

## 2024-11-04 DIAGNOSIS — Z7984 Long term (current) use of oral hypoglycemic drugs: Secondary | ICD-10-CM | POA: Diagnosis not present

## 2024-11-04 DIAGNOSIS — Z87891 Personal history of nicotine dependence: Secondary | ICD-10-CM | POA: Diagnosis not present

## 2024-11-04 DIAGNOSIS — L089 Local infection of the skin and subcutaneous tissue, unspecified: Secondary | ICD-10-CM | POA: Diagnosis not present

## 2024-11-04 DIAGNOSIS — R609 Edema, unspecified: Secondary | ICD-10-CM | POA: Diagnosis not present

## 2024-11-04 DIAGNOSIS — F0394 Unspecified dementia, unspecified severity, with anxiety: Secondary | ICD-10-CM | POA: Diagnosis present

## 2024-11-04 DIAGNOSIS — I739 Peripheral vascular disease, unspecified: Secondary | ICD-10-CM | POA: Diagnosis not present

## 2024-11-04 LAB — CBC WITH DIFFERENTIAL/PLATELET
Abs Immature Granulocytes: 0.21 K/uL — ABNORMAL HIGH (ref 0.00–0.07)
Basophils Absolute: 0.1 K/uL (ref 0.0–0.1)
Basophils Relative: 0 %
Eosinophils Absolute: 0.1 K/uL (ref 0.0–0.5)
Eosinophils Relative: 0 %
HCT: 39.8 % (ref 39.0–52.0)
Hemoglobin: 13.4 g/dL (ref 13.0–17.0)
Immature Granulocytes: 1 %
Lymphocytes Relative: 20 %
Lymphs Abs: 4.5 K/uL — ABNORMAL HIGH (ref 0.7–4.0)
MCH: 27.2 pg (ref 26.0–34.0)
MCHC: 33.7 g/dL (ref 30.0–36.0)
MCV: 80.9 fL (ref 80.0–100.0)
Monocytes Absolute: 1.1 K/uL — ABNORMAL HIGH (ref 0.1–1.0)
Monocytes Relative: 5 %
Neutro Abs: 17.1 K/uL — ABNORMAL HIGH (ref 1.7–7.7)
Neutrophils Relative %: 74 %
Platelets: 717 K/uL — ABNORMAL HIGH (ref 150–400)
RBC: 4.92 MIL/uL (ref 4.22–5.81)
RDW: 15.1 % (ref 11.5–15.5)
WBC: 23.1 K/uL — ABNORMAL HIGH (ref 4.0–10.5)
nRBC: 0 % (ref 0.0–0.2)

## 2024-11-04 LAB — COMPREHENSIVE METABOLIC PANEL WITH GFR
ALT: <5 U/L (ref 0–44)
AST: 30 U/L (ref 15–41)
Albumin: 3.9 g/dL (ref 3.5–5.0)
Alkaline Phosphatase: 81 U/L (ref 38–126)
Anion gap: 12 (ref 5–15)
BUN: 19 mg/dL (ref 8–23)
CO2: 23 mmol/L (ref 22–32)
Calcium: 9.2 mg/dL (ref 8.9–10.3)
Chloride: 99 mmol/L (ref 98–111)
Creatinine, Ser: 1.17 mg/dL (ref 0.61–1.24)
GFR, Estimated: >60 mL/min
Glucose, Bld: 227 mg/dL — ABNORMAL HIGH (ref 70–99)
Potassium: 4 mmol/L (ref 3.5–5.1)
Sodium: 134 mmol/L — ABNORMAL LOW (ref 135–145)
Total Bilirubin: 0.4 mg/dL (ref 0.0–1.2)
Total Protein: 8.3 g/dL — ABNORMAL HIGH (ref 6.5–8.1)

## 2024-11-04 LAB — LACTIC ACID, PLASMA: Lactic Acid, Venous: 2.5 mmol/L (ref 0.5–1.9)

## 2024-11-04 LAB — I-STAT CG4 LACTIC ACID, ED: Lactic Acid, Venous: 2.8 mmol/L (ref 0.5–1.9)

## 2024-11-04 LAB — SEDIMENTATION RATE: Sed Rate: 89 mm/h — ABNORMAL HIGH (ref 0–16)

## 2024-11-04 LAB — PHOSPHORUS: Phosphorus: 2.6 mg/dL (ref 2.5–4.6)

## 2024-11-04 LAB — MAGNESIUM: Magnesium: 2.4 mg/dL (ref 1.7–2.4)

## 2024-11-04 MED ORDER — ONDANSETRON HCL 4 MG/2ML IJ SOLN: 4.0000 mg | Freq: Four times a day (QID) | INTRAMUSCULAR | Status: DC | PRN

## 2024-11-04 MED ORDER — LORAZEPAM 2 MG/ML IJ SOLN
1.0000 mg | Freq: Four times a day (QID) | INTRAMUSCULAR | Status: DC | PRN
  Administered 2024-11-05: 1 mg via INTRAVENOUS
  Filled 2024-11-04: qty 1

## 2024-11-04 MED ORDER — ENOXAPARIN SODIUM 40 MG/0.4ML IJ SOSY
40.0000 mg | PREFILLED_SYRINGE | INTRAMUSCULAR | Status: DC
  Administered 2024-11-05 – 2024-11-11 (×7): 40 mg via SUBCUTANEOUS
  Filled 2024-11-04 (×7): qty 0.4

## 2024-11-04 MED ORDER — SODIUM CHLORIDE 0.9 % IV SOLN
3.0000 g | Freq: Four times a day (QID) | INTRAVENOUS | Status: DC
  Administered 2024-11-04 – 2024-11-05 (×3): 3 g via INTRAVENOUS
  Filled 2024-11-04 (×4): qty 8

## 2024-11-04 MED ORDER — ONDANSETRON HCL 4 MG PO TABS: 4.0000 mg | ORAL_TABLET | Freq: Four times a day (QID) | ORAL | Status: DC | PRN

## 2024-11-04 MED ORDER — SODIUM CHLORIDE 0.9 % IV SOLN
2.0000 g | Freq: Once | INTRAVENOUS | Status: AC
  Administered 2024-11-04: 2 g via INTRAVENOUS
  Filled 2024-11-04: qty 20

## 2024-11-04 MED ORDER — ENOXAPARIN SODIUM 40 MG/0.4ML IJ SOSY: 40.0000 mg | PREFILLED_SYRINGE | INTRAMUSCULAR | Status: DC

## 2024-11-04 MED ORDER — HALOPERIDOL LACTATE 5 MG/ML IJ SOLN: 3.0000 mg | Freq: Four times a day (QID) | INTRAMUSCULAR | Status: DC | PRN

## 2024-11-04 MED ORDER — ACETAMINOPHEN 325 MG PO TABS
650.0000 mg | ORAL_TABLET | Freq: Four times a day (QID) | ORAL | Status: DC | PRN
  Administered 2024-11-06: 650 mg via ORAL
  Filled 2024-11-04: qty 2

## 2024-11-04 MED ORDER — HALOPERIDOL LACTATE 5 MG/ML IJ SOLN
5.0000 mg | Freq: Once | INTRAMUSCULAR | Status: AC
  Administered 2024-11-04: 5 mg via INTRAMUSCULAR
  Filled 2024-11-04: qty 1

## 2024-11-04 MED ORDER — RISPERIDONE 1 MG PO TABS
1.0000 mg | ORAL_TABLET | Freq: Every evening | ORAL | Status: DC
  Administered 2024-11-05 – 2024-11-10 (×4): 1 mg via ORAL
  Filled 2024-11-04 (×6): qty 1

## 2024-11-04 MED ORDER — LINEZOLID 600 MG PO TABS
600.0000 mg | ORAL_TABLET | Freq: Two times a day (BID) | ORAL | Status: DC
  Administered 2024-11-04 – 2024-11-05 (×2): 600 mg via ORAL
  Filled 2024-11-04 (×3): qty 1

## 2024-11-04 MED ORDER — LORAZEPAM 2 MG/ML IJ SOLN
1.0000 mg | Freq: Once | INTRAMUSCULAR | Status: AC
  Administered 2024-11-04: 1 mg via INTRAMUSCULAR
  Filled 2024-11-04: qty 1

## 2024-11-04 MED ORDER — ACETAMINOPHEN 650 MG RE SUPP: 650.0000 mg | Freq: Four times a day (QID) | RECTAL | Status: DC | PRN

## 2024-11-04 MED ORDER — SODIUM CHLORIDE 0.9 % IV BOLUS
1000.0000 mL | Freq: Once | INTRAVENOUS | Status: AC
  Administered 2024-11-04: 1000 mL via INTRAVENOUS

## 2024-11-04 MED ORDER — OXYCODONE-ACETAMINOPHEN 5-325 MG PO TABS
1.0000 | ORAL_TABLET | Freq: Once | ORAL | Status: AC
  Administered 2024-11-04: 1 via ORAL
  Filled 2024-11-04: qty 1

## 2024-11-04 NOTE — ED Notes (Addendum)
 Patient called for vitals. No response heard from the lobby.

## 2024-11-04 NOTE — Consult Note (Signed)
 [facility] Consult    Reason for Consult:  bilateral foot wounds Referring Physician:  Dr. Malvin MRN #:  [id number]  History of Present Illness: This is a 89 y.o. male history of dementia now with pain in his bilateral lower extremities.  He is unable to tell me how long but previous notes state 2 to 3 weeks.  He states that he is particularly tender around his left ankle and foot currently has shoes on request that are not removed them to evaluate his wounds.  Unknown history of vascular disease no previous ABIs although was referred to our office 2 months ago.  Has known ulceration of the right lateral foot and left heel.  He states that he does walk.  Unknown if patient is on blood thinners aspirin is listed in his medications.  Active Ambulatory Problems    Diagnosis Date Noted   No Active Ambulatory Problems   Resolved Ambulatory Problems    Diagnosis Date Noted   No Resolved Ambulatory Problems   No Additional Past Medical History     Prior to Admission medications  Medication Sig Start Date End Date Taking? Authorizing Provider  aspirin EC 81 MG tablet Take 81 mg by mouth daily. Swallow whole.    [provider]  gentamicin  ointment (GARAMYCIN ) 0.1 % Apply 1 Application topically daily. Apply to wound daily 09/09/24   McDonald, Adam R, DPM  metFORMIN (GLUCOPHAGE) 1000 MG tablet Take 1,000 mg by mouth 2 (two) times daily. 01/05/22   [provider]  TRADJENTA 5 MG TABS tablet Take 5 mg by mouth daily. 01/05/22   [provider]    Social History   Socioeconomic History   Marital status: Divorced    Spouse name: Not on file   Number of children: Not on file   Years of education: Not on file   Highest education level: Not on file  Occupational History   Not on file  Tobacco Use   Smoking status: Former    Types: Cigarettes   Smokeless tobacco: Former  Substance and Sexual Activity   Alcohol use: Never   Drug use: Never   Sexual activity:  Not on file  Other Topics Concern   Not on file  Social History Narrative   Not on file   Social Drivers of Health   Tobacco Use: Low Risk (10/07/2024)   Received from [facility]   Patient History    Smoking Tobacco Use: Never    Smokeless Tobacco Use: Never    Passive Exposure: Not on file  Recent Concern: Tobacco Use - Medium Risk (09/09/2024)   Patient History    Smoking Tobacco Use: Former    Smokeless Tobacco Use: Former    Passive Exposure: Not on Actuary Strain: Low Risk (03/18/2024)   Received from [facility]   Overall Financial Resource Strain (CARDIA)    Difficulty of Paying Living Expenses: Not hard at all  Food Insecurity: No Food Insecurity (03/18/2024)   Received from [facility]   Epic    Within the past 12 months, you worried that your food would run out before you got the money to buy more.: Never true    Within the past 12 months, the food you bought just didn't last and you didn't have money to get more.: Never true  Transportation Needs: No Transportation Needs (03/18/2024)   Received from [facility] - Transportation    Lack of Transportation (Medical): No  Lack of Transportation (Non-Medical): No  Physical Activity: Unknown (03/18/2024)   Received from [facility]   Exercise Vital Sign    On average, how many days per week do you engage in moderate to strenuous exercise (like a brisk walk)?: 0 days    Minutes of Exercise per Session: Not on file  Stress: No Stress Concern Present (03/18/2024)   Received from [facility] - Occupational Stress Questionnaire    Feeling of Stress : Not at all  Social Connections: Socially Integrated (03/18/2024)   Received from [facility]   Social Network    How would you rate your social network (family, work, friends)?: Good participation with social networks  Intimate Partner Violence: Not At Risk (03/18/2024)   Received from [facility]   HITS    Over the last 12 months how often did your partner physically hurt you?: Never    Over the last 12 months how often did your partner insult you or talk down to you?: Never    Over the last 12 months how often did your partner threaten you with physical harm?: Never    Over the last 12 months how often did your partner scream or curse at you?: Never  Depression (PHQ2-9): Not on file  Alcohol Screen: Not on file  Housing: Low Risk (03/18/2024)   Received from [facility]    In the last 12 months, was there a time when you were not able to pay the mortgage or rent on time?: No    In the past 12 months, how many times have you moved where you were living?: 0    At any time in the past 12 months, were you homeless or living in a shelter (including now)?: No  Utilities: Not At Risk (03/18/2024)   Received from [facility]    Threatened with loss of utilities: No  Health Literacy: Not on file    No family history on file.  ROS Unable to obtain accurately due to patient dementia    Physical Examination  Vitals:   11/04/24 0857 11/04/24 1305  BP: (!) 144/86   Pulse: 87   Resp: 16   Temp: 98.1 F (36.7 C) 98.2 F (36.8 C)  SpO2: 100%    Body mass index is 27.35 kg/m.  Physical Exam HENT:     Nose: Nose normal.  Eyes:     Pupils: Pupils are equal, round, and reactive to light.  Cardiovascular:     Pulses:          Femoral pulses are 2+ on the right side and 2+ on the left side.      Popliteal pulses are 2+ on the right side and 2+ on the left side.       Dorsalis pedis pulses are 0 on the right side and 0 on the left side.       Posterior tibial pulses are 0 on the right side and 0 on the left side.  Pulmonary:     Effort: Pulmonary effort is normal.  Abdominal:     General: Abdomen is flat.     Palpations: Abdomen is soft. There is no mass.  Musculoskeletal:     Right lower leg: No edema.     Left lower leg: Edema present.   Skin:    General: Skin is warm.     Comments: I was unable to evaluate the wounds  on his feet  Neurological:     Mental Status: He is alert.  Psychiatric:     Comments: Patient is oriented to person and can tell me that he is in the hospital, his judgment appears abnormal      CBC    Component Value Date/Time   WBC 23.1 (H) 11/04/2024 1207   RBC 4.92 11/04/2024 1207   HGB 13.4 11/04/2024 1207   HCT 39.8 11/04/2024 1207   PLT 717 (H) 11/04/2024 1207   MCV 80.9 11/04/2024 1207   MCH 27.2 11/04/2024 1207   MCHC 33.7 11/04/2024 1207   RDW 15.1 11/04/2024 1207   LYMPHSABS 4.5 (H) 11/04/2024 1207   MONOABS 1.1 (H) 11/04/2024 1207   EOSABS 0.1 11/04/2024 1207   BASOSABS 0.1 11/04/2024 1207    BMET    Component Value Date/Time   NA 133 (L) 11/03/2024 1940   K 4.1 11/03/2024 1940   CL 101 11/03/2024 1940   CO2 21 (L) 11/03/2024 1940   GLUCOSE 128 (H) 11/03/2024 1940   BUN 20 11/03/2024 1940   CREATININE 1.13 11/03/2024 1940   CALCIUM 9.1 11/03/2024 1940   GFRNONAA >60 11/03/2024 1940    COAGS: No results found for: INR, PROTIME   Non-Invasive Vascular Imaging:   3 OR MORE VIEW(S) XRAY OF THE LEFT FOOT 11/03/2024 06:29:37 PM   COMPARISON: None available.   CLINICAL HISTORY: pain/swelling   FINDINGS:   BONES AND JOINTS: No acute fracture. No malalignment.   SOFT TISSUES: Dorsal forefoot soft tissue swelling. Vascular calcifications in the soft tissues.   IMPRESSION: 1. Dorsal forefoot soft tissue swelling. 2. Vascular calcifications.   ASSESSMENT/PLAN: This is a 89 y.o. male history of lower extremity wounds unknown duration without palpable pedal pulses.  Plan will be to obtain ABIs and will need to discuss further plans with next of kin.  Jammie Clink C. Sheree, MD [facility] Office: [phone] Pager: [phone]  Addendum: Patient is tentatively planned for angiography in the Cath Lab at [facility] on  Friday (1/2) with Dr. Magda.  Penne Sheree, MD  "

## 2024-11-04 NOTE — ED Provider Notes (Signed)
 " [place] EMERGENCY DEPARTMENT AT [facility] Provider Note   CSN: [id number] Arrival date & time: 11/03/24  1545     Patient presents with: No chief complaint on file.   Patrick [name] is a 89 y.o. male.  He has a history of dementia, level 5 caveat.  He has had increased pain in his feet for 2 or 3 weeks.  No known trauma.  Son noticed it yesterday and found the left foot to be swollen.  He follows with [facility], they did not have any appointments so he brought him in yesterday.  Found to have an elevated white count.  Has known peripheral vascular disease.  Recently had some toenails trimmed and biopsy done.   The history is provided by the patient and a relative.  Foot Pain This is a new problem. The current episode started more than 1 week ago. The problem occurs constantly. The problem has not changed since onset.Pertinent negatives include no chest pain and no abdominal pain. The symptoms are aggravated by walking. Nothing relieves the symptoms. He has tried nothing for the symptoms. The treatment provided no relief.       Prior to Admission medications  Medication Sig Start Date End Date Taking? Authorizing Provider  aspirin EC 81 MG tablet Take 81 mg by mouth daily. Swallow whole.    [provider]  gentamicin  ointment (GARAMYCIN ) 0.1 % Apply 1 Application topically daily. Apply to wound daily 09/09/24   McDonald, Adam R, DPM  metFORMIN (GLUCOPHAGE) 1000 MG tablet Take 1,000 mg by mouth 2 (two) times daily. 01/05/22   [provider]  TRADJENTA 5 MG TABS tablet Take 5 mg by mouth daily. 01/05/22   [provider]    Allergies: Patient has no known allergies.    Review of Systems  Cardiovascular:  Negative for chest pain.  Gastrointestinal:  Negative for abdominal pain.    Updated Vital Signs BP (!) 144/86   Pulse 87   Temp 98.1 F (36.7 C) (Oral)   Resp 16   SpO2 100%   Physical Exam Vitals and nursing note  reviewed.  Constitutional:      General: He is not in acute distress.    Appearance: Normal appearance. He is well-developed.  HENT:     Head: Normocephalic and atraumatic.  Eyes:     Conjunctiva/sclera: Conjunctivae normal.  Cardiovascular:     Rate and Rhythm: Normal rate and regular rhythm.     Heart sounds: No murmur heard. Pulmonary:     Effort: Pulmonary effort is normal. No respiratory distress.     Breath sounds: Normal breath sounds.  Abdominal:     Palpations: Abdomen is soft.     Tenderness: There is no abdominal tenderness.  Musculoskeletal:        General: Swelling and tenderness present.     Cervical back: Neck supple.     Comments: Right foot is cool, has positive Doppler signal.  Diffuse tenderness.  No significant open wounds.  Left foot and ankle are swollen and slightly warm.  Positive Doppler signal.  Minimal wound.  Skin:    General: Skin is warm and dry.     Capillary Refill: Capillary refill takes less than 2 seconds.  Neurological:     General: No focal deficit present.     Mental Status: He is alert. He is disoriented.     Motor: No weakness.     (all labs ordered are listed, but only  abnormal results are displayed) Labs Reviewed  CBC WITH DIFFERENTIAL/PLATELET - Abnormal; Notable for the following components:      Result Value   WBC 20.2 (*)    Hemoglobin 12.7 (*)    HCT 37.5 (*)    Platelets 733 (*)    Neutro Abs 14.1 (*)    Lymphs Abs 4.6 (*)    Monocytes Absolute 1.2 (*)    Abs Immature Granulocytes 0.15 (*)    All other components within normal limits  COMPREHENSIVE METABOLIC PANEL WITH GFR - Abnormal; Notable for the following components:   Sodium 133 (*)    CO2 21 (*)    Glucose, Bld 128 (*)    Total Protein 8.5 (*)    All other components within normal limits  CBC WITH DIFFERENTIAL/PLATELET - Abnormal; Notable for the following components:   WBC 23.1 (*)    Platelets 717 (*)    Neutro Abs 17.1 (*)    Lymphs Abs 4.5 (*)     Monocytes Absolute 1.1 (*)    Abs Immature Granulocytes 0.21 (*)    All other components within normal limits  I-STAT CG4 LACTIC ACID, ED - Abnormal; Notable for the following components:   Lactic Acid, Venous 2.8 (*)    All other components within normal limits  CULTURE, BLOOD (ROUTINE X 2)  CULTURE, BLOOD (ROUTINE X 2)  URINALYSIS, W/ REFLEX TO CULTURE (INFECTION SUSPECTED)  HEMOGLOBIN A1C  SEDIMENTATION RATE  C-REACTIVE PROTEIN  PREALBUMIN  I-STAT CG4 LACTIC ACID, ED    EKG: None  Radiology: DG Tibia/Fibula Left Result Date: 11/03/2024 EXAM: 2 VIEW(S) XRAY OF THE LEFT TIBIA AND FIBULA 11/03/2024 06:29:37 PM COMPARISON: None available. CLINICAL HISTORY: pain/swelling FINDINGS: BONES AND JOINTS: No acute fracture. No malalignment. SOFT TISSUES: Vascular calcifications. Bimalleolar soft tissue swelling. IMPRESSION: 1. Bimalleolar soft tissue swelling. 2. Vascular calcifications. Electronically signed by: Dorethia Molt MD 11/03/2024 08:44 PM EST RP Workstation: [id number]   DG Foot Complete Left Result Date: 11/03/2024 EXAM: 3 OR MORE VIEW(S) XRAY OF THE LEFT FOOT 11/03/2024 06:29:37 PM COMPARISON: None available. CLINICAL HISTORY: pain/swelling FINDINGS: BONES AND JOINTS: No acute fracture. No malalignment. SOFT TISSUES: Dorsal forefoot soft tissue swelling. Vascular calcifications in the soft tissues. IMPRESSION: 1. Dorsal forefoot soft tissue swelling. 2. Vascular calcifications. Electronically signed by: Dorethia Molt MD 11/03/2024 08:43 PM EST RP Workstation: [id number]     .Critical Care  Performed by: Towana Ozell BROCKS, MD Authorized by: Towana Ozell BROCKS, MD   Critical care provider statement:    Critical care time (minutes):  45   Critical care time was exclusive of:  Separately billable procedures and treating other patients   Critical care was necessary to treat or prevent imminent or life-threatening deterioration of the following conditions:  Sepsis   Critical care  was time spent personally by me on the following activities:  Development of treatment plan with patient or surrogate, discussions with consultants, evaluation of patient's response to treatment, examination of patient, obtaining history from patient or surrogate, ordering and performing treatments and interventions, ordering and review of laboratory studies, ordering and review of radiographic studies, pulse oximetry, re-evaluation of patient's condition and review of old charts   I assumed direction of critical care for this patient from another provider in my specialty: no      Medications Ordered in the ED  enoxaparin (LOVENOX) injection 40 mg (has no administration in time range)  acetaminophen  (TYLENOL ) tablet 650 mg (has no administration in time range)    Or  acetaminophen  (TYLENOL ) suppository 650 mg (has no administration in time range)  ondansetron (ZOFRAN) tablet 4 mg (has no administration in time range)    Or  ondansetron (ZOFRAN) injection 4 mg (has no administration in time range)  linezolid  (ZYVOX ) tablet 600 mg (600 mg Oral Patient Refused/Not Given 11/04/24 1548)  Ampicillin -Sulbactam (UNASYN ) 3 g in sodium chloride  0.9 % 100 mL IVPB (3 g Intravenous Patient Refused/Not Given 11/04/24 1549)  acetaminophen  (TYLENOL ) tablet 1,000 mg (1,000 mg Oral Given 11/03/24 1932)  cefTRIAXone  (ROCEPHIN ) 2 g in sodium chloride  0.9 % 100 mL IVPB (0 g Intravenous Stopped 11/04/24 1241)  oxyCODONE -acetaminophen  (PERCOCET/ROXICET) 5-325 MG per tablet 1 tablet (1 tablet Oral Given 11/04/24 1210)  sodium chloride  0.9 % bolus 1,000 mL (0 mLs Intravenous Stopped 11/04/24 1500)  haloperidol  lactate (HALDOL ) injection 5 mg (5 mg Intramuscular Given 11/04/24 1613)    Clinical Course as of 11/04/24 1329  Tue Nov 04, 2024  1221 Lactic 2.83  [LS]  1300 I updated patient and son that I think he should be admitted due to his rising white count elevated lactic acid and probable infection of his left  lower leg.  They were agreeable.  Discussed with [facility] hospitalist Dr. Celinda [MB]    Clinical Course User Index [LS] Rogelia Jerilynn RAMAN, MD [MB] Towana Ozell BROCKS, MD                                 Medical Decision Making Amount and/or Complexity of Data Reviewed Labs: ordered.  Risk Prescription drug management. Decision regarding hospitalization.   This patient complains of pain in his feet; this involves an extensive number of treatment Options and is a complaint that carries with it a high risk of complications and morbidity. The differential includes ischemic limb, cellulitis, diabetic wound, sepsis, SIRS  I ordered, reviewed and interpreted labs, which included CBC with significantly elevated white count, chemistries mildly low bicarb, lactate elevated, blood culture sent I ordered medication IV antibiotics, oral pain medicine, IV fluids and reviewed PMP when indicated. I ordered imaging studies which included x-ray of foot and tib-fib and I independently    visualized and interpreted imaging which showed soft tissue swelling Additional history obtained from patient's son Previous records obtained and reviewed in epic including recent podiatry notes I consulted [facility] hospitalist Dr. Celinda and discussed lab and imaging findings and discussed disposition.  Cardiac monitoring reviewed, sinus rhythm Social determinants considered, no significant barriers Critical Interventions: Management of SIRS with fluids and antibiotics and source identification  After the interventions stated above, I reevaluated the patient and found patient to be resting comfortably and hemodynamically stable Admission and further testing considered, he would benefit from mission the hospital for IV antibiotics and further evaluation.  Patient and son agreeable with plan for admission.      Final diagnoses:  SIRS (systemic inflammatory response syndrome) (HCC)  Cellulitis of left lower leg    ED  Discharge Orders     None          Towana Ozell BROCKS, MD 11/04/24 1643  "

## 2024-11-04 NOTE — ED Notes (Signed)
 Pt refused placement of IV and to take medication. Pt states it makes it legal and he does not want to get into that.

## 2024-11-04 NOTE — ED Notes (Signed)
 Dr Celinda aware pt is refusing IV and po meds. Due to his confusion/dementia he will order meds then staff can attempt IV.

## 2024-11-04 NOTE — H&P (Signed)
 " History and Physical    Patient: Patrick [name] DOB: [birth year] DOA: 11/03/2024 DOS: the patient was seen and examined on 11/04/2024 PCP: Maree Leni Edyth DELENA, MD  Patient coming from: Home  Chief Complaint: No chief complaint on file.  HPI: Patrick [name] is a 89 year old male with a past medical history of dementia, pancreatic mass, right femur lytic lesion, glucose intolerance, hematuria, history of suprapubic catheter, prostate enlargement who was brought to the emergency department by his son due to bilateral foot pain for the past 2 weeks, tenderness, edema and erythema of the LLE, particularly the left foot for the past week.  He is unable to provide much history.  He has been restless and pulled his IV out.  I called his son to let him know the patient's agitation and treatment plan after he was seen by podiatry.  ED course: Initial vital signs were temperature 99.6 F, pulse 75, respirations 16, BP 119/76 mmHg and O2 sat 100% on room air.  The patient received acetaminophen  1000 mg p.o. x 1 dose, ceftriaxone  2 g IVPB, Percocet 5/325 mg 1 tablet p.o. and 1000 mL of normal saline bolus.  Lab work: CBC showed a white count of 20.2, hemoglobin 12.7 g/dL and platelet 266.  CMP shows sodium 133 and CO2 of 21 mmol/L with a normal anion gap, glucose 128 mg/dL, total protein 8.5 g/dL.  The rest of the electrolytes, renal function and the rest of the LFTs were normal.  Lactic acid is 2.8 mmol/L.  Imaging: Left foot x-ray showing dorsal forefoot soft tissue swelling.  Vascular calcifications.  Left tibia/fibula x-ray bimalleolar soft tissue swelling.  Vascular calcification.   Review of Systems: As mentioned in the history of present illness. All other systems reviewed and are negative.  No past medical history on file. No past surgical history on file. Social History:  reports that he has quit smoking. His smoking use included cigarettes. He has quit using smokeless tobacco.  He reports that he does not drink alcohol and does not use drugs.  Allergies[1]  No family history on file.  Prior to Admission medications  Medication Sig Start Date End Date Taking? Authorizing Provider  aspirin EC 81 MG tablet Take 81 mg by mouth daily. Swallow whole.    [provider]  gentamicin  ointment (GARAMYCIN ) 0.1 % Apply 1 Application topically daily. Apply to wound daily 09/09/24   McDonald, Adam R, DPM  metFORMIN (GLUCOPHAGE) 1000 MG tablet Take 1,000 mg by mouth 2 (two) times daily. 01/05/22   [provider]  TRADJENTA 5 MG TABS tablet Take 5 mg by mouth daily. 01/05/22   [provider]    Physical Exam: Vitals:   11/03/24 1659 11/03/24 2234 11/04/24 0441 11/04/24 0857  BP: 119/76 118/74 (!) 148/82 (!) 144/86  Pulse: 75 75 83 87  Resp: 16 18 18 16   Temp: 99.6 F (37.6 C) 98 F (36.7 C) 98.1 F (36.7 C) 98.1 F (36.7 C)  TempSrc: Oral Oral Oral Oral  SpO2: 100% 100% 100% 100%   Physical Exam Vitals and nursing note reviewed.  Constitutional:      General: He is awake. He is not in acute distress.    Appearance: He is ill-appearing.  HENT:     Head: Normocephalic.     Nose: No rhinorrhea.     Mouth/Throat:     Mouth: Mucous membranes are moist.  Eyes:     General: No scleral icterus.    Pupils: Pupils  are equal, round, and reactive to light.  Neck:     Vascular: No JVD.  Cardiovascular:     Rate and Rhythm: Normal rate and regular rhythm.     Heart sounds: S1 normal and S2 normal.  Pulmonary:     Effort: Pulmonary effort is normal.     Breath sounds: Normal breath sounds. No wheezing, rhonchi or rales.  Abdominal:     General: Bowel sounds are normal. There is no distension.     Palpations: Abdomen is soft.     Tenderness: There is no abdominal tenderness. There is no right CVA tenderness or left CVA tenderness.  Musculoskeletal:     Cervical back: Neck supple.     Right lower leg: No edema.     Left lower leg: No edema.   Skin:    General: Skin is warm and dry.  Neurological:     General: No focal deficit present.     Mental Status: He is alert and oriented to person, place, and time.  Psychiatric:        Mood and Affect: Mood normal.        Behavior: Behavior normal. Behavior is cooperative.     Data Reviewed:  Results are pending, will review when available.  Assessment and Plan: Principal Problem:   Sepsis due to cellulitis (HCC) Admit to MedSurg/inpatient. Begin Unasyn  3 g every 6 hours. Begin linezolid  p.o. twice daily. Analgesics as needed. Follow CBC and CMP in a.m. Podiatry consult appreciated. Vascular surgery consult appreciated. - Will transfer to [facility].  Active Problems:   Dementia with behavioral disturbance [facility])  Discussed with the patient's son. Will have to sedate and restrain.    Thrombocytosis In the setting of acute infection. Continue IV antibiotics. Follow platelet count.    Hyponatremia Minimally decreased. Unknown clinical significance. Will recheck before further workup.    Hyperproteinemia Will recheck fasting in AM.    Normocytic anemia In the setting of chronic wounds. Monitor hematocrit and hemoglobin. Transfuse PRBC as needed.    Advance Care Planning:   Code Status: Full Code   Consults: Podiatry and vascular surgery.  Family Communication: I spoke to his son Patrick [name].  Severity of Illness: The appropriate patient status for this patient is INPATIENT. Inpatient status is judged to be reasonable and necessary in order to provide the required intensity of service to ensure the patient's safety. The patient's presenting symptoms, physical exam findings, and initial radiographic and laboratory data in the context of their chronic comorbidities is felt to place them at high risk for further clinical deterioration. Furthermore, it is not anticipated that the patient will be medically stable for discharge from the hospital within 2  midnights of admission.   * I certify that at the point of admission it is my clinical judgment that the patient will require inpatient hospital care spanning beyond 2 midnights from the point of admission due to high intensity of service, high risk for further deterioration and high frequency of surveillance required.*  Author: Alm Dorn Castor, MD 11/04/2024 12:50 PM  For on call review [email or web address].   This document was prepared using Dragon voice recognition software and may contain some unintended transcription errors.     [1] No Known Allergies  "

## 2024-11-04 NOTE — Consult Note (Signed)
 "  PODIATRY CONSULTATION  NAME Patrick [name] MRN [id number] DOB Jul 25, 1935 DOA 11/03/2024   Reason for consult: Wound infection, edema  Attending/Consulting physician: D. Celinda MD  History of present illness: Patrick [name] is a 89 y.o. male.  He has a history of dementia, level 5 caveat.  He has had increased pain in his feet for 2 or 3 weeks.  No known trauma.  Son noticed it yesterday and found the left foot to be swollen.  He follows with [facility], they did not have any appointments so he brought him in yesterday.  Found to have an elevated white count.  Has known peripheral vascular disease.  Recently had some toenails trimmed and biopsy done.  Patient last saw Dr. Silva in our office on September 09, 2024.  There was a small ulceration at the lateral aspect of the right midfoot at the fifth metatarsal base area.  Did not appear overtly infected.  Dr. Silva was concerned about peripheral vascular disease and ordered ABI.  However the patient never completed the study and he did not follow-up since then.  Per report he was brought in the ED today by his son.  Patient has dementia and upon my exam he did not initially want me to remove the Band-Aid from his right foot however he eventually did agree to let me look underneath the Band-Aid but does not want to do anything further than that.  He continues to say something about legal concerns with admission and medical care that is being recommended to him.  He reports significant pain on the left foot heel and lower leg.  Very tender to any palpation.  On the right side he also reports pain around the ulceration on the right lateral midfoot.   No past medical history on file.     Latest Ref Rng & Units 11/04/2024   12:07 PM 11/03/2024    7:40 PM 10/28/2022    7:28 AM  CBC  WBC 4.0 - 10.5 K/uL 23.1  20.2  13.4   Hemoglobin 13.0 - 17.0 g/dL 86.5  87.2  84.6   Hematocrit 39.0 - 52.0 % 39.8  37.5  43.3   Platelets  150 - 400 K/uL 717  733  PLATELET CLUMPS NOTED ON SMEAR, COUNT APPEARS ADEQUATE        Latest Ref Rng & Units 11/03/2024    7:40 PM 10/28/2022    7:28 AM 04/20/2022    1:45 AM  BMP  Glucose 70 - 99 mg/dL 871  850  834   BUN 8 - 23 mg/dL 20  23  24    Creatinine 0.61 - 1.24 mg/dL 8.86  8.53  8.50   Sodium 135 - 145 mmol/L 133  137  137   Potassium 3.5 - 5.1 mmol/L 4.1  4.2  4.3   Chloride 98 - 111 mmol/L 101  105  103   CO2 22 - 32 mmol/L 21  23  25    Calcium 8.9 - 10.3 mg/dL 9.1  89.8  9.8       Physical Exam: Lower Extremity Exam  Left heel with necrotic eschar at the plantar medial aspect  no active drainage or obvious open wound  Malodor is present  Significant edema and erythema of the left foot and lower leg  Pain on palpation of the left foot  Nonpalpable DP and PT pulses bilateral  Right foot with necrotic ulceration with fibropurulent discharge to the right   Ulceration on  the right plantar foot appears worse from last picture with worsening dark discoloration of the skin of the right and left lower extremity         ASSESSMENT/PLAN OF CARE 89 y.o. male with PMHx significant for DM2 with neuropathy and PVD suspected with infected ulceration right lateral midfoot at the fifth metatarsal base concern for underlying osteomyelitis as well as necrotic eschar left heel with concern for underlying abscess or osteomyelitis as well as critical limb ischemia bilateral lower extremity  WBC 23.1 ESR and CRP pending MRI right foot without contrast and MRI left heel without contrast ordered -likely to be completed 12/31 under sedation at [facility] ABI PVR studies-ordered  -Recommend we proceed with MRI left heel and MRI right foot to evaluate for osteomyelitis and abscess.  Patient be transferred to [facility] for vascular evaluation as below as well as MRI possibly under sedation -Further surgical plans pending MRI findings -Discussed case with vascular surgery,  concern for possible critical limb ischemia left worse than right, appreciate recommendations - Continue IV abx broad spectrum pending further culture data - Anticoagulation: Defer to vascular recommendation - Wound care: Recommend Betadine and Band-Aid dressing to right foot - WB status: Weightbearing as tolerated - Will continue to follow   Thank you for the consult.  Please contact me directly with any questions or concerns.           Marolyn JULIANNA Honour, DPM [facility]    [address] [place], [place] [address]                Office [phone]  Fax [phone]     "

## 2024-11-04 NOTE — ED Notes (Signed)
 Patient son states he is leaving his dad here and for any updates to please call him , His number is in the chart

## 2024-11-05 ENCOUNTER — Encounter (HOSPITAL_COMMUNITY): Payer: Self-pay

## 2024-11-05 ENCOUNTER — Inpatient Hospital Stay (HOSPITAL_COMMUNITY)

## 2024-11-05 ENCOUNTER — Other Ambulatory Visit: Payer: Self-pay

## 2024-11-05 ENCOUNTER — Encounter (HOSPITAL_COMMUNITY): Payer: Self-pay | Admitting: Internal Medicine

## 2024-11-05 ENCOUNTER — Encounter (HOSPITAL_COMMUNITY)
Admission: EM | Disposition: A | Discharge status: Hospice | Payer: Self-pay | Source: Home / Self Care | Attending: Internal Medicine

## 2024-11-05 LAB — GLUCOSE, CAPILLARY
Glucose-Capillary: 106 mg/dL — ABNORMAL HIGH (ref 70–99)
Glucose-Capillary: 99 mg/dL (ref 70–99)

## 2024-11-05 LAB — PREALBUMIN: Prealbumin: 25 mg/dL (ref 18–38)

## 2024-11-05 LAB — HEMOGLOBIN A1C
Hgb A1c MFr Bld: 6.5 % — ABNORMAL HIGH (ref 4.8–5.6)
Mean Plasma Glucose: 139.85 mg/dL

## 2024-11-05 LAB — C-REACTIVE PROTEIN: CRP: 18.1 mg/dL — ABNORMAL HIGH

## 2024-11-05 SURGERY — MRI WITH ANESTHESIA
Anesthesia: General

## 2024-11-05 MED ORDER — LINEZOLID 600 MG PO TABS
600.0000 mg | ORAL_TABLET | Freq: Two times a day (BID) | ORAL | Status: DC
  Administered 2024-11-05 – 2024-11-14 (×11): 600 mg via ORAL
  Filled 2024-11-05 (×14): qty 1

## 2024-11-05 MED ORDER — INSULIN ASPART 100 UNIT/ML IJ SOLN
0.0000 [IU] | Freq: Every day | INTRAMUSCULAR | Status: DC
  Administered 2024-11-13: 2 [IU] via SUBCUTANEOUS
  Administered 2024-11-15: 3 [IU] via SUBCUTANEOUS
  Filled 2024-11-05 (×2): qty 2

## 2024-11-05 MED ORDER — SODIUM CHLORIDE 0.9 % IV SOLN
2.0000 g | Freq: Two times a day (BID) | INTRAVENOUS | Status: DC
  Administered 2024-11-05 – 2024-11-14 (×16): 2 g via INTRAVENOUS
  Filled 2024-11-05 (×17): qty 12.5

## 2024-11-05 MED ORDER — ENSURE PLUS HIGH PROTEIN PO LIQD
237.0000 mL | Freq: Two times a day (BID) | ORAL | Status: DC
  Administered 2024-11-05 – 2024-11-19 (×13): 237 mL via ORAL
  Filled 2024-11-05 (×19): qty 237

## 2024-11-05 MED ORDER — JUVEN PO PACK
1.0000 | PACK | Freq: Two times a day (BID) | ORAL | Status: DC
  Administered 2024-11-05 – 2024-11-19 (×16): 1 via ORAL
  Filled 2024-11-05 (×20): qty 1

## 2024-11-05 MED ORDER — ADULT MULTIVITAMIN W/MINERALS CH
1.0000 | ORAL_TABLET | Freq: Every day | ORAL | Status: DC
  Administered 2024-11-05 – 2024-11-19 (×9): 1 via ORAL
  Filled 2024-11-05 (×11): qty 1

## 2024-11-05 MED ORDER — INSULIN ASPART 100 UNIT/ML IJ SOLN
0.0000 [IU] | Freq: Three times a day (TID) | INTRAMUSCULAR | Status: DC
  Administered 2024-11-06: 1 [IU] via SUBCUTANEOUS
  Administered 2024-11-06: 2 [IU] via SUBCUTANEOUS
  Administered 2024-11-16: 1 [IU] via SUBCUTANEOUS
  Administered 2024-11-16: 3 [IU] via SUBCUTANEOUS
  Administered 2024-11-17: 2 [IU] via SUBCUTANEOUS
  Administered 2024-11-17 – 2024-11-18 (×2): 1 [IU] via SUBCUTANEOUS
  Administered 2024-11-18: 2 [IU] via SUBCUTANEOUS
  Filled 2024-11-05: qty 2
  Filled 2024-11-05: qty 1
  Filled 2024-11-05: qty 2
  Filled 2024-11-05: qty 1
  Filled 2024-11-05: qty 6
  Filled 2024-11-05: qty 5
  Filled 2024-11-05: qty 2
  Filled 2024-11-05: qty 1
  Filled 2024-11-05: qty 3
  Filled 2024-11-05: qty 1
  Filled 2024-11-05: qty 2

## 2024-11-05 MED ORDER — METRONIDAZOLE 500 MG/100ML IV SOLN
500.0000 mg | Freq: Two times a day (BID) | INTRAVENOUS | Status: DC
  Administered 2024-11-05 – 2024-11-11 (×10): 500 mg via INTRAVENOUS
  Filled 2024-11-05 (×11): qty 100

## 2024-11-05 NOTE — Progress Notes (Signed)
 Pharmacy Antibiotic Note  Patrick [name] is a 89 y.o. male admitted on 11/03/2024 with osteomyelitis.  Pharmacy has been consulted for Cefepime dosing. CrCl - 43 ml/min.  Plan: Start Cefepime 2 gms IV q12hr Monitor renal function, cultures ad clinical status for adjustments  Weight: 81.6 kg (179 lb 14.3 oz)  Temp (24hrs), Avg:98 F (36.7 C), Min:97.4 F (36.3 C), Max:98.5 F (36.9 C)  Recent Labs  Lab 11/03/24 1940 11/04/24 1207 11/04/24 1217 11/04/24 2030  WBC 20.2* 23.1*  --   --   CREATININE 1.13  --   --  1.17  LATICACIDVEN  --   --  2.8* 2.5*    Estimated Creatinine Clearance: 43 mL/min (by C-G formula based on SCr of 1.17 mg/dL).    Allergies[1]  Antimicrobials this admission: Unasyn  12/30 >> 12/31 Linezolid  12/30 >>  Flagyl 12/31 >> Cefepime 12/31 >>  Thank you for allowing pharmacy to be a part of this patients care.  Donny Alert, PharmD, [facility] Clinical Pharmacist Please see AMION for all Pharmacists' Contact Phone Numbers 11/05/2024, 1:58 PM       [1] No Known Allergies

## 2024-11-05 NOTE — TOC CM/SW Note (Addendum)
 Transition of Care [facility]) - Inpatient Brief Assessment   Patient Details  Name: Patrick [name] MRN: [id number] Date of Birth: 03/27/1935  Transition of Care [facility]) CM/SW Contact:    Lauraine FORBES Saa, LCSWA Phone Number: 11/05/2024, 2:29 PM   Clinical Narrative:  2:29 PM Per chart review, patient resides at home. Patient has a PCP and insurance on file. Patient does not have SNF/HH/DME history in chart. Patient's preferred pharmacy listed is [facility] [address]. Patient is not currently oriented based on prior notes and is unable to answer SDOH questions. TOC consult was placed for medication assistance and HH/DME needs. TOC will continue to follow.  Transition of Care Asessment: Insurance and Status: Insurance coverage has been reviewed Patient has primary care physician: Yes Home environment has been reviewed: Private Residence Prior level of function:: N/A Prior/Current Home Services: No current home services Social Drivers of Health Review:  (Patient unable to answer) Readmission risk has been reviewed: Yes (Currently Green 12%) Transition of care needs: transition of care needs identified, TOC will continue to follow

## 2024-11-05 NOTE — Plan of Care (Signed)
" °  Problem: Safety: Goal: Non-violent Restraint(s) Outcome: Progressing   Problem: Education: Goal: Knowledge of General Education information will improve Description: Including pain rating scale, medication(s)/side effects and non-pharmacologic comfort measures Outcome: Progressing   Problem: Health Behavior/Discharge Planning: Goal: Ability to manage health-related needs will improve Outcome: Progressing   Problem: Clinical Measurements: Goal: Ability to maintain clinical measurements within normal limits will improve Outcome: Progressing Goal: Will remain free from infection Outcome: Progressing Goal: Diagnostic test results will improve Outcome: Progressing Goal: Respiratory complications will improve Outcome: Progressing Goal: Cardiovascular complication will be avoided Outcome: Progressing   Problem: Activity: Goal: Risk for activity intolerance will decrease Outcome: Progressing   Problem: Nutrition: Goal: Adequate nutrition will be maintained Outcome: Progressing   Problem: Coping: Goal: Level of anxiety will decrease Outcome: Progressing   Problem: Elimination: Goal: Will not experience complications related to bowel motility Outcome: Progressing Goal: Will not experience complications related to urinary retention Outcome: Progressing   Problem: Pain Managment: Goal: General experience of comfort will improve and/or be controlled Outcome: Progressing   "

## 2024-11-05 NOTE — Anesthesia Preprocedure Evaluation (Addendum)
"                                    Anesthesia Evaluation  Patient identified by MRN, date of birth, ID band  Reviewed: Allergy & Precautions, NPO status , Patient's Chart, lab work & pertinent test results  History of Anesthesia Complications Negative for: history of anesthetic complications  Airway        Dental   Pulmonary former smoker          Cardiovascular      Neuro/Psych  PSYCHIATRIC DISORDERS     Dementia    GI/Hepatic  Hx of Pancreatic Mass    Endo/Other  diabetes, Poorly Controlled, Type 2    Renal/GU Renal InsufficiencyRenal disease (Baseline Cr ~1.15)    BPH s/p Suprapubic Catheter    Musculoskeletal   Non-healing Ulcer of Foot c/b Osteomyelitis?    Abdominal   Peds  Hematology  (+) Blood dyscrasia, anemia  Hgb 13.4, Plts 717K (11/04/24)     Anesthesia Other Findings   Reproductive/Obstetrics                              Anesthesia Physical Anesthesia Plan  ASA: 3  Anesthesia Plan: General   Post-op Pain Management:    Induction: Intravenous  PONV Risk Score and Plan: 2 and Treatment may vary due to age or medical condition  Airway Management Planned: Oral ETT  Additional Equipment:   Intra-op Plan:   Post-operative Plan: Extubation in OR  Informed Consent:   Plan Discussed with:   Anesthesia Plan Comments:          Anesthesia Quick Evaluation  "

## 2024-11-05 NOTE — Progress Notes (Signed)
 Initial Nutrition Assessment  DOCUMENTATION CODES:   Not applicable  INTERVENTION:  Liberalize diet to carb modified to allow for more dining choices Encourage PO intake Automatic trays Juven BID to support wound healing Ensure Plus High Protein po BID with lunch and dinner, each supplement provides 350 kcal and 20 grams of protein Recommend adding ACHS CBG checks and possible insulin coverage to achieve glycemic control MVI with minerals daily  NUTRITION DIAGNOSIS:   Increased nutrient needs related to wound healing as evidenced by estimated needs.  GOAL:   Patient will meet greater than or equal to 90% of their needs  MONITOR:   PO intake, Supplement acceptance, Labs, Skin  REASON FOR ASSESSMENT:   Consult Wound healing  ASSESSMENT:  Pt with hx of dementia, DM type 2 with neuropathy, and PVD presented to ED with increased swelling and pain to the feet. Workup consistent with sepsis due to cellulitis.  12/29 - presented to [facility] ED 12/31 - transferred to [facility] evaluated and ordered MRI which showed abscess and osteomyelitis of the left calcaneus as well as concern for osteomyelitis of the right fifth metatarsal base and midshaft area. Likely will need a left BKA and right partial fifth metatarsal resection if aggressive care is desired. Transferred to [facility] for vascular surgery services.   RD working remotely, unable to obtain a hx at this time via room phone due to pt's level of orientation. Will liberalize diet and add nutrition supplements to encourage PO intake. Will follow-up in person for nutrition hx and assessment.   Admit / Current weight: 81.6 kg    Average Meal Intake: None recorded at this time  Nutritionally Relevant Medications: Continuous Infusions:  ceFEPime (MAXIPIME) IV     metronidazole     PRN Meds: ondansetron  Labs Reviewed  NUTRITION - FOCUSED PHYSICAL EXAM: Defer to in-person assessment  Diet Order:   Diet  Order             Diet Carb Modified Room service appropriate? No  Diet effective now                   EDUCATION NEEDS:  Not appropriate for education at this time  Skin:  Skin Assessment: Reviewed RN Assessment Osteomyelitis to bilateral feet. Wound to the left heel and right medial foot (pictures in chart)  Last BM:  unsure  Height:   Ht Readings from Last 1 Encounters:  11/05/24 5' 8 (1.727 m)    Weight:   Wt Readings from Last 1 Encounters:  11/04/24 81.6 kg    Ideal Body Weight:  70 kg  BMI:  Body mass index is 27.35 kg/m.  Estimated Nutritional Needs:  Kcal:  1600-1800 kcal/d Protein:  80-95g/d Fluid:  1.8L/d    Vernell Lukes, RD, LDN, CNSC Registered Dietitian II Please reach out via secure chat

## 2024-11-05 NOTE — Progress Notes (Signed)
 Noted MRI findings including abscess and osteomyelitis of the left calcaneus as well as concern for osteomyelitis of the right fifth metatarsal base and midshaft area  Unfortunately patient likely to require a left below the knee amputation.  Further planning will be determined over the next few days.  For the right foot he will require a partial fifth metatarsal resection I&D likely with antibiotic beads and wound VAC application.  Plan is for patient to be transferred to [facility] for further care.  Vascular surgery has seen the patient appreciate their recommendations plan for angiogram tentatively Friday.  ABIs are scheduled for today  Podiatry will continue to follow the patient and update with further surgical recommendations tomorrow when he is at [facility], however likely any podiatric surgical intervention will occur following angiogram        Marolyn JULIANNA Honour, DPM [facility] / [facility]                   11/05/2024

## 2024-11-05 NOTE — Progress Notes (Addendum)
 " PROGRESS NOTE    Patrick [name]  FMW:[id number] DOB: 1935-01-09 DOA: 11/03/2024 PCP: Maree Leni Edyth DELENA, MD    Brief Narrative:   Patrick [name] is a 89 y.o. male with past medical history significant for dementia, DM2, BPH/urinary retention s/p suprapubic catheter, pancreatic mass who presented to [facility] ED on 11/03/2024 complaining of bilateral foot pain, swelling over the last 2 weeks.  Brought in by his son.  Patient unable to provide much history given his underlying dementia.  Son reports bilateral foot pain over the last 2 weeks with tenderness, erythema especially of the left lower extremity.  In the ED, temperature 99.6 F, pulse 75, respirations 16, BP 119/76 mmHg and O2 sat 100% on room air.  WBC count of 20.2, hemoglobin 12.7, platelet count 733.  Sodium 133, CO2 21, glucose 128, BUN 20, creatinine 1.13.  Lactic acid 2.8.  WC count 18.1.  Left tibia/fibula x-ray with bimalleolar soft tissue swelling, vascular calcifications.  Left foot x-ray with dorsal forefoot soft tissue swelling, vascular calcifications.  Podiatry, vascular surgery was consulted.  The patient received acetaminophen  1000 mg p.o. x 1 dose, ceftriaxone  2 g IVPB, Percocet 5/325 mg 1 tablet p.o. and 1000 mL of normal saline bolus. TRH consulted for admission and patient was pending transfer to [facility] for further evaluation management.  Assessment & Plan:   Sepsis, POA Left foot abscess/osteomyelitis/myositis/myositis Right foot fifth metatarsal osteomyelitis; concern for myositis Lactic acidosis Concern for critical lower extremity limb ischemia Patient presenting with bilateral lower extremity pain, swelling over the last 2 weeks.  MR left heel with soft tissue wound medial heel with 2.9 x 2.8 x 0.7 cm complex fluid collection consistent with abscess, possible early sinus tract formation, osteomyelitis posterior inferior calcaneus.  Right foot MRI with ulceration lateral midfoot  overlying fifth metatarsal with marrow edema consistent with early/mild osteomyelitis and concern for myositis. -- Podiatry, vascular surgery following, appreciate assistance -- WBC 20.2>23.1 -- ESR 89 -- CRP 18.1 -- Lactic acid 2.87>2.5 -- Vascular duplex ultrasound bilateral lower extremities: Pending to rule out DVT -- ABI: Pending -- Zyvox  600 mg p.o. every 12 hours -- Discontinue Unasyn  in favor of cefepime given osteomyelitis with abscess noted on imaging -- Start Flagyl 5 mg IV every 12 hours for anaerobic coverage -- Will likely need left BKA, right partial fifth metatarsal resection/I&D with antibiotic beads/wound VAC -- CBC, ESR, CRP, Lactic acid in the am  DM2 Hemoglobin A1c 6.5%.  On metformin/Tradjenta outpatient. -- Sensitive SSI for coverage -- CBG before every meal/at bedtime  History of BPH/urinary retention s/p SPC catheter Follows with [facility] in [place].  Dementia -- Delirium precautions -- Get up during the day -- Encourage a familiar face to remain present throughout the day -- Keep blinds open and lights on during daylight hours -- Minimize the use of opioids/benzodiazepines -- Risperidone 1 mg p.o. nightly  DVT prophylaxis: enoxaparin (LOVENOX) injection 40 mg Start: 11/04/24 2200    Code Status: Full Code Family Communication:   Disposition Plan:  Level of care: Med-Surg Status is: Inpatient Remains inpatient appropriate because: IV antibiotics, pending lower extremity angiogram by vascular surgery Friday, will need definitive treatment with likely left BKA and right partial fifth metatarsal resection with I&D    Consultants:  Podiatry, Dr. Malvin Vascular surgery, Dr. Sheree  Procedures:  None  Antimicrobials:  Zyvox  12/30>> Unasyn  12/30 - 12/31 Ceftriaxone  12/30 - 12/30 Cefepime 12/31>> Metronidazole 12/31>>   Subjective: Patient seen examined  bedside, lying in bed.  Remains in ED holding area.  Just returned back from  MRI, currently sedated after receiving Ativan .  No family present at bedside.  Unable to obtain any further ROS given his underlying dementia, coupled with recent Ativan  administration.  Awaiting transfer to [facility] for definitive care and further evaluation by podiatry and vascular surgery.  Discussed with podiatry, Dr. Malvin this am.  RN having hard time obtaining labs this morning.  Otherwise no other acute concerns per nursing staff this morning.  Objective: Vitals:   11/05/24 0600 11/05/24 0630 11/05/24 0700 11/05/24 1015  BP: 137/69 (!) 156/65 (!) 132/52 (!) 136/110  Pulse:   72 73  Resp:   16 18  Temp:   98 F (36.7 C) 98.2 F (36.8 C)  TempSrc:      SpO2:   96% 100%  Weight:        Intake/Output Summary (Last 24 hours) at 11/05/2024 1254 Last data filed at 11/04/2024 2105 Gross per 24 hour  Intake 593.13 ml  Output --  Net 593.13 ml   Filed Weights   11/04/24 1400  Weight: 81.6 kg    Examination:  Physical Exam: GEN: NAD, somnolent, will wake to command.  Chronically ill/disheveled/elderly in appearance, HEENT: NCAT, PERRL, dry mucous membranes PULM: CTAB w/o wheezes/crackles, normal respiratory effort, on room air CV: RRR w/o M/G/R GI: abd soft, NTND, + BS MSK:+ LE Edema (left greater than right), moves all extremities independently Integumentary: Right and left foot photos as depicted        Data Reviewed: I have personally reviewed following labs and imaging studies  CBC: Recent Labs  Lab 11/03/24 1940 11/04/24 1207  WBC 20.2* 23.1*  NEUTROABS 14.1* 17.1*  HGB 12.7* 13.4  HCT 37.5* 39.8  MCV 81.0 80.9  PLT 733* 717*   Basic Metabolic Panel: Recent Labs  Lab 11/03/24 1940 11/04/24 2030  NA 133* 134*  K 4.1 4.0  CL 101 99  CO2 21* 23  GLUCOSE 128* 227*  BUN 20 19  CREATININE 1.13 1.17  CALCIUM 9.1 9.2  MG  --  2.4  PHOS  --  2.6   GFR: Estimated Creatinine Clearance: 43 mL/min (by C-G formula based on SCr of 1.17  mg/dL). Liver Function Tests: Recent Labs  Lab 11/03/24 1940 11/04/24 2030  AST 17 30  ALT <5 <5  ALKPHOS 76 81  BILITOT 0.5 0.4  PROT 8.5* 8.3*  ALBUMIN 3.9 3.9   No results for input(s): LIPASE, AMYLASE in the last 168 hours. No results for input(s): AMMONIA in the last 168 hours. Coagulation Profile: No results for input(s): INR, PROTIME in the last 168 hours. Cardiac Enzymes: No results for input(s): CKTOTAL, CKMB, CKMBINDEX, TROPONINI in the last 168 hours. BNP (last 3 results) No results for input(s): PROBNP in the last 8760 hours. HbA1C: Recent Labs    11/04/24 2030  HGBA1C 6.5*   CBG: No results for input(s): GLUCAP in the last 168 hours. Lipid Profile: No results for input(s): CHOL, HDL, LDLCALC, TRIG, CHOLHDL, LDLDIRECT in the last 72 hours. Thyroid Function Tests: No results for input(s): TSH, T4TOTAL, FREET4, T3FREE, THYROIDAB in the last 72 hours. Anemia Panel: No results for input(s): VITAMINB12, FOLATE, FERRITIN, TIBC, IRON, RETICCTPCT in the last 72 hours. Sepsis Labs: Recent Labs  Lab 11/04/24 1217 11/04/24 2030  LATICACIDVEN 2.8* 2.5*    Recent Results (from the past 240 hours)  Blood culture (routine x 2)     Status: None (Preliminary  result)   Collection Time: 11/04/24  8:30 PM   Specimen: BLOOD LEFT ARM  Result Value Ref Range Status   Specimen Description   Final    BLOOD LEFT ARM Performed at [facility], [address], [place], [place] [address]    Special Requests   Final    BOTTLES DRAWN AEROBIC AND ANAEROBIC Blood Culture adequate volume Performed at [facility], [address] [place], [place] [address]    Culture   Final    NO GROWTH < 12 HOURS Performed at [facility], [address], [place], [place] [address]    Report Status PENDING  Incomplete         Radiology Studies: MR HEEL LEFT WO CONTRAST Result Date:  11/05/2024 CLINICAL DATA:  Left heel wound with swelling and pain. Concern for osteomyelitis and abscess. EXAM: MR OF THE LEFT HEEL WITHOUT CONTRAST TECHNIQUE: Multiplanar, multisequence MR imaging of the left heel was performed. No intravenous contrast was administered. COMPARISON:  Radiographs dated 11/03/2024. FINDINGS: Bones/Joint/Cartilage Soft tissue wound extending along the medial heel. There is a 2.9 x 2.8 x 0.7 cm heterogenous T2 hyperintense fluid collection, most compatible with an abscess, extending along the medial undersurface of the posterior calcaneus and the origin of the central cord of the plantar fascia. This collection demonstrates tapering extension towards the overlying soft tissue wound at the medial heel, which could reflect early sinus tract formation. Marrow edema and T1 hypointensity of the underlying posterior inferior calcaneus is compatible with osteomyelitis. Mild degenerative changes throughout the hindfoot and midfoot. No joint effusion. Ligaments Medial and lateral ankle ligaments are intact. Muscles and Tendons Flexor, peroneal and extensor compartment tendons are intact. Increased T2 signal of the intrinsic foot musculature may reflect chronic denervation changes, however, myositis can not be excluded. Soft tissue Diffuse subcutaneous and soft tissue edema of the ankle. Subcutaneous edema extends along the dorsal hindfoot and midfoot. IMPRESSION: 1. Soft tissue wound extending along the medial heel. There is a 2.9 x 2.8 x 0.7 cm complex fluid collection most compatible with an abscess extending along the medial undersurface of the posterior calcaneus and the origin of the central cord of the plantar fascia. Possible early sinus tract formation to the overlying soft tissue wound at the medial heel. 2. 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 3. Increased signal of the intrinsic foot musculature may reflect chronic denervation changes, however, myositis can  not be excluded. 4. Diffuse subcutaneous and soft tissue edema of the ankle. Subcutaneous edema extends along the dorsal hindfoot and midfoot. Electronically Signed   By: Harrietta Sherry M.D.   On: 11/05/2024 11:20   MR FOOT RIGHT WO CONTRAST Result Date: 11/05/2024 CLINICAL DATA:  Ulceration at the lateral midfoot. Concern for osteomyelitis. EXAM: MRI OF THE RIGHT FOREFOOT WITHOUT CONTRAST TECHNIQUE: Multiplanar, multisequence MR imaging of the right foot was performed. No intravenous contrast was administered. COMPARISON:  None Available. FINDINGS: Bones/Joint/Cartilage Ulceration at the lateral midfoot overlying the level of the fifth metatarsal base. There is marrow edema of the underlying lateral half of the fifth metatarsal base without evidence of significant corresponding confluent T1 hypointensity. These findings could reflect early/mild osteomyelitis versus reactive marrow changes. Mild degenerative changes throughout the forefoot and midfoot. No joint effusion. Ligaments Collateral ligaments are intact.  Lisfranc ligament is intact. Muscles and Tendons Increased signal of the intrinsic forefoot musculature may reflect chronic denervation changes, however, myositis can not be excluded. No appreciable acute tendinous abnormality.  Soft tissue No loculated fluid collection. Subcutaneous edema of the dorsal forefoot. IMPRESSION: 1. Ulceration at the lateral midfoot overlying the fifth metatarsal base. Marrow edema of the underlying lateral half of the fifth metatarsal base could reflect early/mild osteomyelitis versus reactive marrow changes. 2. No fluid collection. 3. Increased signal of the intrinsic forefoot musculature may reflect chronic denervation changes, however, myositis can not be excluded. 4. Subcutaneous edema of the dorsal forefoot. Electronically Signed   By: Harrietta Sherry M.D.   On: 11/05/2024 10:55   DG Tibia/Fibula Left Result Date: 11/03/2024 EXAM: 2 VIEW(S) XRAY OF THE LEFT  TIBIA AND FIBULA 11/03/2024 06:29:37 PM COMPARISON: None available. CLINICAL HISTORY: pain/swelling FINDINGS: BONES AND JOINTS: No acute fracture. No malalignment. SOFT TISSUES: Vascular calcifications. Bimalleolar soft tissue swelling. IMPRESSION: 1. Bimalleolar soft tissue swelling. 2. Vascular calcifications. Electronically signed by: Dorethia Molt MD 11/03/2024 08:44 PM EST RP Workstation: [id number]   DG Foot Complete Left Result Date: 11/03/2024 EXAM: 3 OR MORE VIEW(S) XRAY OF THE LEFT FOOT 11/03/2024 06:29:37 PM COMPARISON: None available. CLINICAL HISTORY: pain/swelling FINDINGS: BONES AND JOINTS: No acute fracture. No malalignment. SOFT TISSUES: Dorsal forefoot soft tissue swelling. Vascular calcifications in the soft tissues. IMPRESSION: 1. Dorsal forefoot soft tissue swelling. 2. Vascular calcifications. Electronically signed by: Dorethia Molt MD 11/03/2024 08:43 PM EST RP Workstation: [id number]        Scheduled Meds:  enoxaparin (LOVENOX) injection  40 mg Subcutaneous Q24H   linezolid   600 mg Oral Q12H   risperiDONE  1 mg Oral QPM   Continuous Infusions:  ampicillin -sulbactam (UNASYN ) IV Stopped (11/05/24 0823)     LOS: 1 day    Time spent: 56 minutes spent on 11/05/2024 caring for this patient face-to-face including chart review, ordering labs/tests, documenting, discussion with nursing staff, consultants, updating family and interview/physical exam    Camellia PARAS Rama Sorci, DO [facility] Available via Epic secure chat 7am-7pm After these hours, please refer to coverage provider listed on [email or web address] 11/05/2024, 12:54 PM   "

## 2024-11-06 ENCOUNTER — Inpatient Hospital Stay (HOSPITAL_COMMUNITY)

## 2024-11-06 DIAGNOSIS — I7090 Unspecified atherosclerosis: Secondary | ICD-10-CM

## 2024-11-06 DIAGNOSIS — R400 Somnolence: Secondary | ICD-10-CM | POA: Diagnosis not present

## 2024-11-06 DIAGNOSIS — L0390 Cellulitis, unspecified: Secondary | ICD-10-CM | POA: Diagnosis not present

## 2024-11-06 DIAGNOSIS — A419 Sepsis, unspecified organism: Secondary | ICD-10-CM | POA: Diagnosis not present

## 2024-11-06 LAB — COMPREHENSIVE METABOLIC PANEL WITH GFR
ALT: 5 U/L (ref 0–44)
AST: 25 U/L (ref 15–41)
Albumin: 3.5 g/dL (ref 3.5–5.0)
Alkaline Phosphatase: 72 U/L (ref 38–126)
Anion gap: 14 (ref 5–15)
BUN: 12 mg/dL (ref 8–23)
CO2: 20 mmol/L — ABNORMAL LOW (ref 22–32)
Calcium: 8.9 mg/dL (ref 8.9–10.3)
Chloride: 100 mmol/L (ref 98–111)
Creatinine, Ser: 1.05 mg/dL (ref 0.61–1.24)
GFR, Estimated: >60 mL/min
Glucose, Bld: 103 mg/dL — ABNORMAL HIGH (ref 70–99)
Potassium: 4.4 mmol/L (ref 3.5–5.1)
Sodium: 135 mmol/L (ref 135–145)
Total Bilirubin: 0.5 mg/dL (ref 0.0–1.2)
Total Protein: 7.7 g/dL (ref 6.5–8.1)

## 2024-11-06 LAB — GLUCOSE, CAPILLARY
Glucose-Capillary: 106 mg/dL — ABNORMAL HIGH (ref 70–99)
Glucose-Capillary: 220 mg/dL — ABNORMAL HIGH (ref 70–99)
Glucose-Capillary: 179 mg/dL — ABNORMAL HIGH (ref 70–99)
Glucose-Capillary: 188 mg/dL — ABNORMAL HIGH (ref 70–99)

## 2024-11-06 LAB — CBC
HCT: 34.3 % — ABNORMAL LOW (ref 39.0–52.0)
Hemoglobin: 11.7 g/dL — ABNORMAL LOW (ref 13.0–17.0)
MCH: 27.4 pg (ref 26.0–34.0)
MCHC: 34.1 g/dL (ref 30.0–36.0)
MCV: 80.3 fL (ref 80.0–100.0)
Platelets: 653 K/uL — ABNORMAL HIGH (ref 150–400)
RBC: 4.27 MIL/uL (ref 4.22–5.81)
RDW: 15.1 % (ref 11.5–15.5)
WBC: 23.1 K/uL — ABNORMAL HIGH (ref 4.0–10.5)
nRBC: 0 % (ref 0.0–0.2)

## 2024-11-06 LAB — C-REACTIVE PROTEIN: CRP: 22.3 mg/dL — ABNORMAL HIGH

## 2024-11-06 LAB — LACTIC ACID, PLASMA: Lactic Acid, Venous: 1.3 mmol/L (ref 0.5–1.9)

## 2024-11-06 LAB — SEDIMENTATION RATE: Sed Rate: 62 mm/h — ABNORMAL HIGH (ref 0–16)

## 2024-11-06 NOTE — Hospital Course (Addendum)
 Patrick [name] is a 89 y.o. male with a history of dementia, diabetes mellitus type 2, BPH/urinary retention s/p suprapubic catheter, pancreatic mass.  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 Vascular surgery and podiatry consulted. Empiric IV antibiotics started. Patient underwent angiogram with left dorsalis pedis and left anterior tibial angioplasty.  He is now status post I&D with fifth metatarsal base resection of the right foot.  Current plan is for left lower extremity BKA and he has tentatively scheduled for Left BKA late AM on 11/14/24 however family did not consent to amputation.  ID has not been consulted and they are recommending palliative care consultation and changing IV antibiotics to oral doxycycline  and Augmentin  and plan for 4 weeks from 1/7 to 12/10/2024 with ID follow-up on 2026.    Vascular recommends outpatient follow-up as well for further evaluation.  Palliative was reconsulted for further goals of care discussion.  Wound VAC was placed and the podiatry team recommends that this need to be changed twice weekly with VAC therapy for 1 to 3 months.  Now has worsening Hyponatremia and Constipation so will start IVF and provide Bowel Regimen. Palliative to have GOC Discussion. PT/OT Recommending SNF and awaiting Insurance Approval.   Assessment and Plan:  Sepsis: Present on admission. Secondary to foot infection and Osteomyelitis. Blood cultures obtained and patient started empirically on antibiotics. Blood cultures no growth x5 days. Continue Antibiotics (Changed by ID) as below. WBC Trend as below as well.    Left Foot Abscess/Osteomyelitis/Myositis: Noted on MRI. Podiatry and vascular surgery consulted on admission. Empiric Linezolid , Ceftriaxone  and Unasyn  started with transition to linezolid , cefepime  and metronidazole . Podiatry recommending leg amputation  (AKA vs BKA). Vascular surgery aware and will work on timing of amputation of LLE.  -Continued Linezolid , cefepime  and metronidazole  until surgery however will discontinue and changed to oral doxycycline  and Augmentin  per ID recommendations. Vascular Surgery to follow up 11/13/24 about the timing of BKA however family has declined BKA so will not pursue this. WBC Trend remains elevated:  Recent Labs  Lab 11/10/24 0342 11/12/24 0518 11/13/24 0204 11/14/24 1320 11/15/24 0509 11/16/24 1043 11/17/24 0531  WBC 14.5* 18.7* 17.6* 18.4* 16.8* 15.4* 15.2*  -ID consulted and recommending changing IV antibiotics to p.o. doxycycline  and Augmentin  for 4 weeks of treatment and recommending palliative involvement given that he needs source control; palliative to have further goals of care discussion. Meeting to take place 11/18/24  Right Foot Fifth Metatarsal Osteomyelitis s/p POD5 Partial fifth metatarsal base resection antibiotic beads and wound VAC application: Noted on MRI. Podiatry with plan for 5th ray resection I&D and he is postoperative day 0 with antibiotic bead application of vancomycin  tobramycin  as well as application of negative pressure wound.  However negative pressure wound did not work properly and started getting blockages and there is oozing noted from the surgical site.  The VAC was discontinued and he is now switched to saline wet-to-dry packing with wound and dry gauze 4 x 4 ABD pad Kerlix Ace wrapped with compression.  The podiatry team feels that the wound VAC is likely be reapplied later this week by the WOCN in the room consulting for wound VAC change on Friday for application and this was done -Aerobic/Anaerobic Cx showed:  Gram Stain FEW WBC PRESENT,BOTH PMN AND MONONUCLEAR RARE GRAM POSITIVE COCCI Performed at [facility], [address] [place], [place] [address]  Culture  FEW CORYNEBACTERIUM SPECIES CORYNEBACTERIUM JEIKEIUM Standardized susceptibility testing for this  organism is not available. MIXED ANAEROBIC FLORA PRESENT.  CALL LAB IF FURTHER IID REQUIRED.  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 ABIs performed on 1/1 and are significant for moderate bilateral disease. Patient underwent angiogram on 1/2, with vascular performing left dorsalis pedis and left anterior tibial angioplasty. Patient started on Aspirin  325 mg po Daily and Clopidogrel  75 mg po Daily. Right lower extremity angiogram performed on 1/5 with laser arthrectomy of right anterior tibial artery and balloon angioplasty. Patient underwent Surgery as above on the R -Podiatry followed up again and applied wound VAC and recommending wound VAC for 1 to 3 months with changing twice weekly. - Palliative care consulted for consulted for further goals of care discussion and recommendations remain for FULL Code and continuing current scope of care. IN person GOC meeting to be held 11/18/24 with the Palliative Medicine Team; He expressed to the Palliative team today to just let me die  Hyponatremia: Na+ Trend:  Recent Labs  Lab 11/10/24 0342 11/12/24 0625 11/13/24 0204 11/14/24 1320 11/15/24 0509 11/16/24 1043 11/17/24 0531  NA 134* 132* 131* 131* 129* 128* 132*  -Was Fluctuating; C/w NS @ 75 mL/hr. CTM and Trend and repeat CMP in the AM   Hypophosphatemia: Phos Level is now 3.6. CTM & Replete as Necessary. Repeat Phos Level in the AM   Constipation: Start Senna-Docusate 1 tab po BID, Miralax  17 grams BID, and Bisacodyl  10 mg RC Suppository PRN Moderate Constipation  Diabetes Mellitus Type 2: Well controlled based on hemoglobin A1C of 6.5%. Continue Very Sensitive Novolog  SSI AC/HS. CTM CBGs per Protocol. CBG Trend ranging from 112-275 on the last 7 checks   BPH / Urinary Retention: Patient is s/p history of suprapubic catheter placement. Patient follows with Urology  as an outpatient. Will need changing every 4 weeks  Dysphagia: SLP Recommending Dysphagia 3 Diet with Thin Liquids after eval  HypoNatremia: Na+ Trended down to 128 and is now improving:  Recent Labs  Lab 11/10/24 0342 11/12/24 0625 11/13/24 0204 11/14/24 1320 11/15/24 0509 11/16/24 1043 11/17/24 0531  NA 134* 132* 131* 131* 129* 128* 132*  -C/w NS @ 75 mL/hr. CTM and Trend and Repeat CMP in the AM    Dementia: Appears to be mild, however patient developed some evidence of delirium on 1/3. Patient was on Risperdal . Patient with hyperactive and hypoactive delirium. CT head unremarkable for acute process. Unclear if episodes are related to Risperidone  so this was discontinued.  -C/w Delirium precautions and started Atarax  25 mg TIDprn Anxiety.  Intermittently has some agitation and combativeness with behavioral disturbances  Pancreatic Mass: Had a CT abdomen pelvis urogram with and without IV contrast back in May 2025 at [facility] which showed a complex lesion involving the pancreatic body measuring upwards of 1.7 x 1.3 x 1.7 cm.  This was recommended to be further eval with pancreatic protocol MRI or EUS.  He is referred to GI and unclear if he ever followed up on this.  Palliative care has been consulted for further goals of care discussion  Microcytic Anemia: Hgb/Hct Trend:  Recent Labs  Lab 11/10/24 0342 11/12/24 0518 11/13/24 0204 11/14/24 1320 11/15/24 0509 11/16/24 1043 11/17/24 0531  HGB 11.4* 11.0* 10.3* 11.9* 12.5* 10.9* 9.5*  HCT 32.8* 31.2* 29.5* 34.7* 36.4* 30.9* 26.9*  MCV 79.6* 77.6* 79.1*  79.6* 80.2 78.6* 78.7*  -Checked Anemia Panel and showed an iron level of 35, UIBC 197, TIBC of 232, saturation ratios of 50%, ferritin level of 107 prolactin level of 9.1 and vitamin B12 584. CTM for S/Sx of Bleeding. No overt bleeding noted. Repeat CBC in the AM  Thrombocytosis: Improving. In the setting of Infection. Current Plt Count Trend Improving:  Recent Labs  Lab  11/10/24 0342 11/12/24 0518 11/13/24 0204 11/14/24 1320 11/15/24 0509 11/16/24 1043 11/17/24 0531  PLT 658* 554* 530* 556* 551* 513* 487*  -CTM and Trend and repeat CBC in the AM   Hypoalbuminemia: Patient's Albumin Lvl Trend went from 3.9 x2 -> 3.5 x2 -> 3.4 -> 3.5 -> 3.3 x2. CTM & Trend & Repeat CMP in the AM  Overweight: Complicates overall prognosis and care. Estimated body mass index is 27.35 kg/m as calculated from the following:   Height as of this encounter: 5' 8 (1.727 m).   Weight as of this encounter: 81.6 kg. Weight Loss and Dietary Counseling given

## 2024-11-06 NOTE — Progress Notes (Signed)
 ABI exam has been completed.   Results can be found under chart review under CV PROC. 11/06/2024 3:03 PM Ailene Royal RVT, RDMS

## 2024-11-06 NOTE — Plan of Care (Signed)
" °  Problem: Safety: Goal: Non-violent Restraint(s) Outcome: Progressing   Problem: Education: Goal: Knowledge of General Education information will improve Description: Including pain rating scale, medication(s)/side effects and non-pharmacologic comfort measures Outcome: Not Progressing   Problem: Health Behavior/Discharge Planning: Goal: Ability to manage health-related needs will improve Outcome: Not Progressing   Problem: Clinical Measurements: Goal: Ability to maintain clinical measurements within normal limits will improve Outcome: Progressing Goal: Will remain free from infection Outcome: Progressing Goal: Diagnostic test results will improve Outcome: Progressing Goal: Respiratory complications will improve Outcome: Progressing Goal: Cardiovascular complication will be avoided Outcome: Progressing   Problem: Activity: Goal: Risk for activity intolerance will decrease Outcome: Progressing   Problem: Nutrition: Goal: Adequate nutrition will be maintained Outcome: Progressing   Problem: Coping: Goal: Level of anxiety will decrease Outcome: Progressing   Problem: Elimination: Goal: Will not experience complications related to bowel motility Outcome: Progressing Goal: Will not experience complications related to urinary retention Outcome: Progressing   Problem: Pain Managment: Goal: General experience of comfort will improve and/or be controlled Outcome: Progressing   Problem: Safety: Goal: Ability to remain free from injury will improve Outcome: Progressing   Problem: Skin Integrity: Goal: Risk for impaired skin integrity will decrease Outcome: Progressing   Problem: Education: Goal: Ability to describe self-care measures that may prevent or decrease complications (Diabetes Survival Skills Education) will improve Outcome: Progressing Goal: Individualized Educational Video(s) Outcome: Progressing   Problem: Coping: Goal: Ability to adjust to condition or  change in health will improve Outcome: Progressing   Problem: Fluid Volume: Goal: Ability to maintain a balanced intake and output will improve Outcome: Progressing   Problem: Metabolic: Goal: Ability to maintain appropriate glucose levels will improve Outcome: Progressing   Problem: Nutritional: Goal: Maintenance of adequate nutrition will improve Outcome: Progressing Goal: Progress toward achieving an optimal weight will improve Outcome: Progressing   Problem: Skin Integrity: Goal: Risk for impaired skin integrity will decrease Outcome: Progressing   "

## 2024-11-06 NOTE — Progress Notes (Signed)
 "  PROGRESS NOTE    Patrick [name]  FMW:[id number] DOB: 06-Jun-1935 DOA: 11/03/2024 PCP: Maree Leni Edyth DELENA, MD   Brief Narrative: Patrick [name] is a 89 y.o. male with a history of dementia, diabetes mellitus type 2, BPH/urinary retention s/p suprapubic catheter, pancreatic mass.  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 Vascular surgery and podiatry consulted. Empiric IV antibiotics started.   Assessment and Plan:  Sepsis Present on admission. Secondary to foot infection. Blood cultures obtained and patient started empirically on antibiotics. - Follow blood cultures - Continue antibiotics  Left foot abscess/osteomyelitis/myositis Noted on MRI. Podiatry and vascular surgery consulted on admission. Empiric Linezolid , Ceftriaxone  and Unasyn  started with transition to linezolid , cefepime and metronidazole. Podiatry considering left below knee amputation. - Continue linezolid , cefepime and metronidazole - Follow-up podiatry recommendations  Right foot fifth metatarsal osteomyelitis Noted on MRI. Podiatry with plan for possible 5th ray resection I&D. - Follow-up podiatry recommendations - Continue antibiotics  Possible critical limb ischemia of bilateral lower extremity Vascular surgery consulted for management. - Vascular surgery recommendations: ABIs, plan for angiography on 11/07/24  Diabetes mellitus type 2 Well controlled based on hemoglobin A1C of 6.5%. - Continue SSI  BPH Urinary retention Patient is s/p history of suprapubic catheter placement. Patient follows with urology as an outpatient.  Dementia Appears to be mild. - Delirium precautions   DVT prophylaxis: Lovenox Code Status:   Code Status: Full Code Family Communication: None at bedside Disposition Plan: Discharge pending ongoing specialist recommendations and transition to  outpatient antibiotic regimen as required   Consultants:  Vascular surgery Podiatry  Procedures:  None  Antimicrobials: Linezolid  Ceftriaxone  Cefepime Unasyn  Flagyl    Subjective: Patient reports no specific issues overnight.  Objective: BP (!) 140/89 (BP Location: Right Arm)   Pulse 85   Temp 98.1 F (36.7 C) (Oral)   Resp 16   Ht 5' 8 (1.727 m)   Wt 81.6 kg   SpO2 100%   BMI 27.35 kg/m   Examination:  General exam: Appears calm and comfortable. Respiratory system: Clear to auscultation. Respiratory effort normal. Cardiovascular system: S1 & S2 heard, RRR. No murmur. Gastrointestinal system: Abdomen is nondistended, soft and nontender. Normal bowel sounds heard. Central nervous system: Alert and oriented. No focal neurological deficits. Musculoskeletal: LLE ankle edema. No calf tenderness Skin: No cyanosis. No rashes Psychiatry: Judgement and insight appear normal. Mood & affect appropriate.    Data Reviewed: I have personally reviewed following labs and imaging studies  CBC Lab Results  Component Value Date   WBC 23.1 (H) 11/04/2024   RBC 4.92 11/04/2024   HGB 13.4 11/04/2024   HCT 39.8 11/04/2024   MCV 80.9 11/04/2024   MCH 27.2 11/04/2024   PLT 717 (H) 11/04/2024   MCHC 33.7 11/04/2024   RDW 15.1 11/04/2024   LYMPHSABS 4.5 (H) 11/04/2024   MONOABS 1.1 (H) 11/04/2024   EOSABS 0.1 11/04/2024   BASOSABS 0.1 11/04/2024     Last metabolic panel Lab Results  Component Value Date   NA 134 (L) 11/04/2024   K 4.0 11/04/2024   CL 99 11/04/2024   CO2 23 11/04/2024   BUN 19 11/04/2024   CREATININE 1.17 11/04/2024   GLUCOSE 227 (H) 11/04/2024   GFRNONAA >60 11/04/2024   CALCIUM 9.2 11/04/2024   PHOS 2.6 11/04/2024   PROT 8.3 (H) 11/04/2024   ALBUMIN 3.9 11/04/2024   BILITOT 0.4 11/04/2024  ALKPHOS 81 11/04/2024   AST 30 11/04/2024   ALT <5 11/04/2024   ANIONGAP 12 11/04/2024    GFR: Estimated Creatinine Clearance: 43 mL/min (by C-G  formula based on SCr of 1.17 mg/dL).  Recent Results (from the past 240 hours)  Blood culture (routine x 2)     Status: None (Preliminary result)   Collection Time: 11/04/24  8:30 PM   Specimen: BLOOD LEFT ARM  Result Value Ref Range Status   Specimen Description   Final    BLOOD LEFT ARM Performed at [facility], [address], [place], [place] [address]    Special Requests   Final    BOTTLES DRAWN AEROBIC AND ANAEROBIC Blood Culture adequate volume Performed at [facility], [address], [place], [place] [address]    Culture   Final    NO GROWTH 1 DAY Performed at [facility], [address] [place], [place] [address]    Report Status PENDING  Incomplete      Radiology Studies: MR HEEL LEFT WO CONTRAST Result Date: 11/05/2024 CLINICAL DATA:  Left heel wound with swelling and pain. Concern for osteomyelitis and abscess. EXAM: MR OF THE LEFT HEEL WITHOUT CONTRAST TECHNIQUE: Multiplanar, multisequence MR imaging of the left heel was performed. No intravenous contrast was administered. COMPARISON:  Radiographs dated 11/03/2024. FINDINGS: Bones/Joint/Cartilage Soft tissue wound extending along the medial heel. There is a 2.9 x 2.8 x 0.7 cm heterogenous T2 hyperintense fluid collection, most compatible with an abscess, extending along the medial undersurface of the posterior calcaneus and the origin of the central cord of the plantar fascia. This collection demonstrates tapering extension towards the overlying soft tissue wound at the medial heel, which could reflect early sinus tract formation. Marrow edema and T1 hypointensity of the underlying posterior inferior calcaneus is compatible with osteomyelitis. Mild degenerative changes throughout the hindfoot and midfoot. No joint effusion. Ligaments Medial and lateral ankle ligaments are intact. Muscles and Tendons Flexor, peroneal and extensor compartment tendons are intact. Increased T2 signal  of the intrinsic foot musculature may reflect chronic denervation changes, however, myositis can not be excluded. Soft tissue Diffuse subcutaneous and soft tissue edema of the ankle. Subcutaneous edema extends along the dorsal hindfoot and midfoot. IMPRESSION: 1. Soft tissue wound extending along the medial heel. There is a 2.9 x 2.8 x 0.7 cm complex fluid collection most compatible with an abscess extending along the medial undersurface of the posterior calcaneus and the origin of the central cord of the plantar fascia. Possible early sinus tract formation to the overlying soft tissue wound at the medial heel. 2. 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 3. Increased signal of the intrinsic foot musculature may reflect chronic denervation changes, however, myositis can not be excluded. 4. Diffuse subcutaneous and soft tissue edema of the ankle. Subcutaneous edema extends along the dorsal hindfoot and midfoot. Electronically Signed   By: Harrietta Sherry M.D.   On: 11/05/2024 11:20   MR FOOT RIGHT WO CONTRAST Result Date: 11/05/2024 CLINICAL DATA:  Ulceration at the lateral midfoot. Concern for osteomyelitis. EXAM: MRI OF THE RIGHT FOREFOOT WITHOUT CONTRAST TECHNIQUE: Multiplanar, multisequence MR imaging of the right foot was performed. No intravenous contrast was administered. COMPARISON:  None Available. FINDINGS: Bones/Joint/Cartilage Ulceration at the lateral midfoot overlying the level of the fifth metatarsal base. There is marrow edema of the underlying lateral half of the fifth metatarsal base without evidence of significant corresponding confluent T1 hypointensity. These findings could reflect  early/mild osteomyelitis versus reactive marrow changes. Mild degenerative changes throughout the forefoot and midfoot. No joint effusion. Ligaments Collateral ligaments are intact.  Lisfranc ligament is intact. Muscles and Tendons Increased signal of the intrinsic forefoot musculature may  reflect chronic denervation changes, however, myositis can not be excluded. No appreciable acute tendinous abnormality. Soft tissue No loculated fluid collection. Subcutaneous edema of the dorsal forefoot. IMPRESSION: 1. Ulceration at the lateral midfoot overlying the fifth metatarsal base. Marrow edema of the underlying lateral half of the fifth metatarsal base could reflect early/mild osteomyelitis versus reactive marrow changes. 2. No fluid collection. 3. Increased signal of the intrinsic forefoot musculature may reflect chronic denervation changes, however, myositis can not be excluded. 4. Subcutaneous edema of the dorsal forefoot. Electronically Signed   By: Harrietta Sherry M.D.   On: 11/05/2024 10:55      LOS: 2 days    Elgin Lam, MD [facility] 11/06/2024, 7:33 AM   If 7PM-7AM, please contact night-coverage [email or web address]  "

## 2024-11-07 ENCOUNTER — Inpatient Hospital Stay (HOSPITAL_COMMUNITY)

## 2024-11-07 ENCOUNTER — Encounter (HOSPITAL_COMMUNITY)
Admission: EM | Disposition: A | Discharge status: Hospice | Payer: Self-pay | Source: Home / Self Care | Attending: Internal Medicine

## 2024-11-07 DIAGNOSIS — R609 Edema, unspecified: Secondary | ICD-10-CM | POA: Diagnosis not present

## 2024-11-07 DIAGNOSIS — A419 Sepsis, unspecified organism: Secondary | ICD-10-CM | POA: Diagnosis not present

## 2024-11-07 DIAGNOSIS — I70244 Atherosclerosis of native arteries of left leg with ulceration of heel and midfoot: Secondary | ICD-10-CM | POA: Diagnosis not present

## 2024-11-07 DIAGNOSIS — L0390 Cellulitis, unspecified: Secondary | ICD-10-CM | POA: Diagnosis not present

## 2024-11-07 DIAGNOSIS — L97429 Non-pressure chronic ulcer of left heel and midfoot with unspecified severity: Secondary | ICD-10-CM

## 2024-11-07 HISTORY — PX: LOWER EXTREMITY INTERVENTION: CATH118252

## 2024-11-07 HISTORY — PX: ABDOMINAL AORTOGRAM W/LOWER EXTREMITY: CATH118223

## 2024-11-07 LAB — GLUCOSE, CAPILLARY
Glucose-Capillary: 130 mg/dL — ABNORMAL HIGH (ref 70–99)
Glucose-Capillary: 189 mg/dL — ABNORMAL HIGH (ref 70–99)

## 2024-11-07 MED ORDER — LIDOCAINE HCL (PF) 1 % IJ SOLN
INTRAMUSCULAR | Status: AC
  Filled 2024-11-07: qty 30

## 2024-11-07 MED ORDER — LABETALOL HCL 5 MG/ML IV SOLN: 10.0000 mg | INTRAVENOUS | Status: DC | PRN

## 2024-11-07 MED ORDER — CLOPIDOGREL BISULFATE 75 MG PO TABS
75.0000 mg | ORAL_TABLET | Freq: Every day | ORAL | Status: DC
  Administered 2024-11-13 – 2024-11-19 (×6): 75 mg via ORAL
  Filled 2024-11-07 (×9): qty 1

## 2024-11-07 MED ORDER — HEPARIN SODIUM (PORCINE) 1000 UNIT/ML IJ SOLN
INTRAMUSCULAR | Status: DC | PRN
  Administered 2024-11-07: 8000 [IU] via INTRAVENOUS

## 2024-11-07 MED ORDER — HEPARIN SODIUM (PORCINE) 1000 UNIT/ML IJ SOLN
INTRAMUSCULAR | Status: AC
  Filled 2024-11-07: qty 10

## 2024-11-07 MED ORDER — FENTANYL CITRATE (PF) 100 MCG/2ML IJ SOLN
INTRAMUSCULAR | Status: AC
  Filled 2024-11-07: qty 2

## 2024-11-07 MED ORDER — ROSUVASTATIN CALCIUM 20 MG PO TABS
20.0000 mg | ORAL_TABLET | Freq: Every day | ORAL | Status: DC
  Administered 2024-11-07 – 2024-11-19 (×8): 20 mg via ORAL
  Filled 2024-11-07 (×10): qty 1

## 2024-11-07 MED ORDER — HYDRALAZINE HCL 20 MG/ML IJ SOLN: 5.0000 mg | INTRAMUSCULAR | Status: DC | PRN

## 2024-11-07 MED ORDER — FENTANYL CITRATE (PF) 100 MCG/2ML IJ SOLN
INTRAMUSCULAR | Status: DC | PRN
  Administered 2024-11-07 (×2): 50 ug via INTRAVENOUS

## 2024-11-07 MED ORDER — HEPARIN (PORCINE) IN NACL 1000-0.9 UT/500ML-% IV SOLN
INTRAVENOUS | Status: DC | PRN
  Administered 2024-11-07: 1000 mL

## 2024-11-07 MED ORDER — CLOPIDOGREL BISULFATE 300 MG PO TABS
ORAL_TABLET | ORAL | Status: DC | PRN
  Administered 2024-11-07: 300 mg via ORAL

## 2024-11-07 MED ORDER — ASPIRIN 81 MG PO CHEW
CHEWABLE_TABLET | ORAL | Status: DC | PRN
  Administered 2024-11-07: 325 mg via ORAL

## 2024-11-07 MED ORDER — MIDAZOLAM HCL 2 MG/2ML IJ SOLN
INTRAMUSCULAR | Status: AC
  Filled 2024-11-07: qty 2

## 2024-11-07 MED ORDER — IODIXANOL 320 MG/ML IV SOLN
INTRAVENOUS | Status: DC | PRN
  Administered 2024-11-07: 50 mL via INTRA_ARTERIAL

## 2024-11-07 MED ORDER — SODIUM CHLORIDE 0.9% FLUSH: 3.0000 mL | INTRAVENOUS | Status: DC | PRN

## 2024-11-07 MED ORDER — CLOPIDOGREL BISULFATE 75 MG PO TABS
300.0000 mg | ORAL_TABLET | Freq: Once | ORAL | Status: AC
  Administered 2024-11-07: 300 mg via ORAL
  Filled 2024-11-07: qty 4

## 2024-11-07 MED ORDER — SODIUM CHLORIDE 0.9% FLUSH
3.0000 mL | Freq: Two times a day (BID) | INTRAVENOUS | Status: DC
  Administered 2024-11-08 – 2024-11-19 (×15): 3 mL via INTRAVENOUS

## 2024-11-07 MED ORDER — CLOPIDOGREL BISULFATE 300 MG PO TABS
ORAL_TABLET | ORAL | Status: AC
  Filled 2024-11-07: qty 1

## 2024-11-07 MED ORDER — ASPIRIN 325 MG PO TABS
ORAL_TABLET | ORAL | Status: AC
  Filled 2024-11-07: qty 1

## 2024-11-07 MED ORDER — SODIUM CHLORIDE 0.9 % WEIGHT BASED INFUSION
1.0000 mL/kg/h | INTRAVENOUS | Status: AC
  Administered 2024-11-07: 1 mL/kg/h via INTRAVENOUS

## 2024-11-07 MED ORDER — SODIUM CHLORIDE 0.9 % IV SOLN: 250.0000 mL | INTRAVENOUS | Status: AC | PRN

## 2024-11-07 MED ORDER — MIDAZOLAM HCL (PF) 2 MG/2ML IJ SOLN
INTRAMUSCULAR | Status: DC | PRN
  Administered 2024-11-07: 1 mg via INTRAVENOUS

## 2024-11-07 MED ORDER — LIDOCAINE HCL (PF) 1 % IJ SOLN
INTRAMUSCULAR | Status: DC | PRN
  Administered 2024-11-07: 20 mL

## 2024-11-07 MED ORDER — ASPIRIN 325 MG PO TBEC
325.0000 mg | DELAYED_RELEASE_TABLET | Freq: Every day | ORAL | Status: DC
  Administered 2024-11-07: 325 mg via ORAL
  Filled 2024-11-07 (×3): qty 1

## 2024-11-07 NOTE — Plan of Care (Signed)
  Problem: Safety: Goal: Non-violent Restraint(s) Outcome: Progressing   Problem: Education: Goal: Knowledge of General Education information will improve Description: Including pain rating scale, medication(s)/side effects and non-pharmacologic comfort measures Outcome: Progressing   Problem: Health Behavior/Discharge Planning: Goal: Ability to manage health-related needs will improve Outcome: Progressing   Problem: Clinical Measurements: Goal: Ability to maintain clinical measurements within normal limits will improve Outcome: Progressing Goal: Will remain free from infection Outcome: Progressing Goal: Diagnostic test results will improve Outcome: Progressing Goal: Respiratory complications will improve Outcome: Progressing Goal: Cardiovascular complication will be avoided Outcome: Progressing   Problem: Activity: Goal: Risk for activity intolerance will decrease Outcome: Progressing   Problem: Nutrition: Goal: Adequate nutrition will be maintained Outcome: Progressing   Problem: Coping: Goal: Level of anxiety will decrease Outcome: Progressing   Problem: Elimination: Goal: Will not experience complications related to bowel motility Outcome: Progressing Goal: Will not experience complications related to urinary retention Outcome: Progressing   Problem: Pain Managment: Goal: General experience of comfort will improve and/or be controlled Outcome: Progressing   Problem: Safety: Goal: Ability to remain free from injury will improve Outcome: Progressing   Problem: Skin Integrity: Goal: Risk for impaired skin integrity will decrease Outcome: Progressing   Problem: Education: Goal: Ability to describe self-care measures that may prevent or decrease complications (Diabetes Survival Skills Education) will improve Outcome: Progressing Goal: Individualized Educational Video(s) Outcome: Progressing   Problem: Coping: Goal: Ability to adjust to condition or change  in health will improve Outcome: Progressing   Problem: Fluid Volume: Goal: Ability to maintain a balanced intake and output will improve Outcome: Progressing   Problem: Health Behavior/Discharge Planning: Goal: Ability to identify and utilize available resources and services will improve Outcome: Progressing Goal: Ability to manage health-related needs will improve Outcome: Progressing   Problem: Metabolic: Goal: Ability to maintain appropriate glucose levels will improve Outcome: Progressing   Problem: Nutritional: Goal: Maintenance of adequate nutrition will improve Outcome: Progressing Goal: Progress toward achieving an optimal weight will improve Outcome: Progressing   Problem: Skin Integrity: Goal: Risk for impaired skin integrity will decrease Outcome: Progressing   Problem: Tissue Perfusion: Goal: Adequacy of tissue perfusion will improve Outcome: Progressing

## 2024-11-07 NOTE — Progress Notes (Signed)
 [facility] OF [place] PROGRESS NOTE  ASSESSMENT / PLAN: Patrick [name] is a 89 y.o. male with bilateral lower extremity ischemic ulceration. Plan angiogram today to evaluate.   SUBJECTIVE: No complaints. Reviewed plan for angiogram.   OBJECTIVE: BP 125/69 (BP Location: Left Arm)   Pulse 67   Temp 98.7 F (37.1 C) (Oral)   Resp 16   Ht 5' 8 (1.727 m)   Wt 81.6 kg   SpO2 98%   BMI 27.35 kg/m   Intake/Output Summary (Last 24 hours) at 11/07/2024 0947 Last data filed at 11/07/2024 9587 Gross per 24 hour  Intake 600 ml  Output 720 ml  Net -120 ml        Latest Ref Rng & Units 11/06/2024    7:58 AM 11/04/2024   12:07 PM 11/03/2024    7:40 PM  CBC  WBC 4.0 - 10.5 K/uL 23.1  23.1  20.2   Hemoglobin 13.0 - 17.0 g/dL 88.2  86.5  87.2   Hematocrit 39.0 - 52.0 % 34.3  39.8  37.5   Platelets 150 - 400 K/uL 653  717  733         Latest Ref Rng & Units 11/06/2024    7:58 AM 11/04/2024    8:30 PM 11/03/2024    7:40 PM  CMP  Glucose 70 - 99 mg/dL 896  772  871   BUN 8 - 23 mg/dL 12  19  20    Creatinine 0.61 - 1.24 mg/dL 8.94  8.82  8.86   Sodium 135 - 145 mmol/L 135  134  133   Potassium 3.5 - 5.1 mmol/L 4.4  4.0  4.1   Chloride 98 - 111 mmol/L 100  99  101   CO2 22 - 32 mmol/L 20  23  21    Calcium 8.9 - 10.3 mg/dL 8.9  9.2  9.1   Total Protein 6.5 - 8.1 g/dL 7.7  8.3  8.5   Total Bilirubin 0.0 - 1.2 mg/dL 0.5  0.4  0.5   Alkaline Phos 38 - 126 U/L 72  81  76   AST 15 - 41 U/L 25  30  17    ALT 0 - 44 U/L 5  <5  <5     Estimated Creatinine Clearance: 48 mL/min (by C-G formula based on SCr of 1.05 mg/dL).  Debby SAILOR. Magda, MD [facility] Phone Number: [phone] 11/07/2024 9:47 AM

## 2024-11-07 NOTE — Progress Notes (Signed)
 "  PROGRESS NOTE    Patrick [name]  FMW:[id number] DOB: January 25, 1935 DOA: 11/03/2024 PCP: Maree Leni Edyth DELENA, MD   Brief Narrative: Patrick [name] is a 89 y.o. male with a history of dementia, diabetes mellitus type 2, BPH/urinary retention s/p suprapubic catheter, pancreatic mass.  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 Vascular surgery and podiatry consulted. Empiric IV antibiotics started.   Assessment and Plan:  Sepsis Present on admission. Secondary to foot infection. Blood cultures obtained and patient started empirically on antibiotics. - Follow blood cultures - Continue antibiotics  Left foot abscess/osteomyelitis/myositis Noted on MRI. Podiatry and vascular surgery consulted on admission. Empiric Linezolid , Ceftriaxone  and Unasyn  started with transition to linezolid , cefepime and metronidazole. Podiatry considering left below knee amputation. - Continue linezolid , cefepime and metronidazole - Follow-up podiatry recommendations: pending today  Right foot fifth metatarsal osteomyelitis Noted on MRI. Podiatry with plan for possible 5th ray resection I&D. - Follow-up podiatry recommendations: pending today - Continue antibiotics  Possible critical limb ischemia of bilateral lower extremity Vascular surgery consulted for management. ABIs performed on 1/1 and are significant for moderate bilateral disease. - Vascular surgery recommendations: Plan for angiography on 11/07/24; recommendations pending today  Diabetes mellitus type 2 Well controlled based on hemoglobin A1C of 6.5%. - Continue SSI  BPH Urinary retention Patient is s/p history of suprapubic catheter placement. Patient follows with urology as an outpatient.  Dementia Appears to be mild. - Delirium precautions   DVT prophylaxis: Lovenox Code Status:   Code Status: Full Code Family  Communication: None at bedside Disposition Plan: Discharge pending ongoing specialist recommendations and transition to outpatient antibiotic regimen as required   Consultants:  Vascular surgery Podiatry  Procedures:  None  Antimicrobials: Linezolid  Ceftriaxone  Cefepime Unasyn  Flagyl    Subjective: No concerns this morning except wondering if amputation is completely necessary.  Objective: BP 125/69 (BP Location: Left Arm)   Pulse 67   Temp 98.7 F (37.1 C) (Oral)   Resp 16   Ht 5' 8 (1.727 m)   Wt 81.6 kg   SpO2 100%   BMI 27.35 kg/m   Examination:  General exam: Appears calm and comfortable. Respiratory system: Clear to auscultation. Respiratory effort normal. Cardiovascular system: S1 & S2 heard, RRR. No murmur. Gastrointestinal system: Abdomen is nondistended, soft and nontender. Normal bowel sounds heard. Central nervous system: Alert and oriented. Psychiatry: Judgement and insight appear normal. Mood & affect appropriate.    Data Reviewed: I have personally reviewed following labs and imaging studies  CBC Lab Results  Component Value Date   WBC 23.1 (H) 11/06/2024   RBC 4.27 11/06/2024   HGB 11.7 (L) 11/06/2024   HCT 34.3 (L) 11/06/2024   MCV 80.3 11/06/2024   MCH 27.4 11/06/2024   PLT 653 (H) 11/06/2024   MCHC 34.1 11/06/2024   RDW 15.1 11/06/2024   LYMPHSABS 4.5 (H) 11/04/2024   MONOABS 1.1 (H) 11/04/2024   EOSABS 0.1 11/04/2024   BASOSABS 0.1 11/04/2024     Last metabolic panel Lab Results  Component Value Date   NA 135 11/06/2024   K 4.4 11/06/2024   CL 100 11/06/2024   CO2 20 (L) 11/06/2024   BUN 12 11/06/2024   CREATININE 1.05 11/06/2024   GLUCOSE 103 (H) 11/06/2024   GFRNONAA >60 11/06/2024   CALCIUM 8.9 11/06/2024   PHOS 2.6 11/04/2024   PROT 7.7 11/06/2024   ALBUMIN 3.5  11/06/2024   BILITOT 0.5 11/06/2024   ALKPHOS 72 11/06/2024   AST 25 11/06/2024   ALT 5 11/06/2024   ANIONGAP 14 11/06/2024    GFR: Estimated  Creatinine Clearance: 48 mL/min (by C-G formula based on SCr of 1.05 mg/dL).  Recent Results (from the past 240 hours)  Blood culture (routine x 2)     Status: None (Preliminary result)   Collection Time: 11/04/24  8:30 PM   Specimen: BLOOD LEFT ARM  Result Value Ref Range Status   Specimen Description   Final    BLOOD LEFT ARM Performed at [facility], [address] [place], [place] [address]    Special Requests   Final    BOTTLES DRAWN AEROBIC AND ANAEROBIC Blood Culture adequate volume Performed at [facility], [address] [place], [place] [address]    Culture   Final    NO GROWTH 2 DAYS Performed at [facility], [address], [place], [place] [address]    Report Status PENDING  Incomplete      Radiology Studies: VAS US  ABI WITH/WO TBI Result Date: 11/06/2024  LOWER EXTREMITY DOPPLER STUDY Patient Name:  Patrick [name]  Date of Exam:   11/06/2024 Medical Rec #: [id number]           Accession #:    [id number] Date of Birth: 14-Jan-1935           Patient Gender: M Patient Age:   34 years Exam Location:  [facility] Procedure:      VAS US  ABI WITH/WO TBI Referring Phys: PENNE COLORADO --------------------------------------------------------------------------------  Indications: Peripheral artery disease. High Risk Factors: Past history of smoking.  Comparison Study: No previous exams Performing Technologist: Leigh Rom RVT/RDMS  Examination Guidelines: A complete evaluation includes at minimum, Doppler waveform signals and systolic blood pressure reading at the level of bilateral brachial, anterior tibial, and posterior tibial arteries, when vessel segments are accessible. Bilateral testing is considered an integral part of a complete examination. Photoelectric Plethysmograph (PPG) waveforms and toe systolic pressure readings are included as required and additional duplex testing as needed. Limited examinations for reoccurring  indications may be performed as noted.  ABI Findings: +---------+------------------+-----+----------+--------+ Right    Rt Pressure (mmHg)IndexWaveform  Comment  +---------+------------------+-----+----------+--------+ Brachial 148                    triphasic          +---------+------------------+-----+----------+--------+ PTA      105               0.71 monophasic         +---------+------------------+-----+----------+--------+ DP       85                0.57 monophasic         +---------+------------------+-----+----------+--------+ Great Toe50                0.34 Abnormal           +---------+------------------+-----+----------+--------+ +---------+------------------+-----+----------+-------+ Left     Lt Pressure (mmHg)IndexWaveform  Comment +---------+------------------+-----+----------+-------+ Brachial 140                    triphasic         +---------+------------------+-----+----------+-------+ PTA                             monophasic      +---------+------------------+-----+----------+-------+ DP  111               0.75 monophasic        +---------+------------------+-----+----------+-------+ Great Toe0                 0.00 Absent            +---------+------------------+-----+----------+-------+  Unable to tolerate cuff inflation for PTA pressure.  Summary: Right: Resting right ankle-brachial index indicates moderate right lower extremity arterial disease. The right toe-brachial index is abnormal.  Left: Resting left ankle-brachial index indicates moderate left lower extremity arterial disease. The left toe-brachial index is abnormal.  *See table(s) above for measurements and observations.     Preliminary    MR HEEL LEFT WO CONTRAST Result Date: 11/05/2024 CLINICAL DATA:  Left heel wound with swelling and pain. Concern for osteomyelitis and abscess. EXAM: MR OF THE LEFT HEEL WITHOUT CONTRAST TECHNIQUE: Multiplanar, multisequence MR  imaging of the left heel was performed. No intravenous contrast was administered. COMPARISON:  Radiographs dated 11/03/2024. FINDINGS: Bones/Joint/Cartilage Soft tissue wound extending along the medial heel. There is a 2.9 x 2.8 x 0.7 cm heterogenous T2 hyperintense fluid collection, most compatible with an abscess, extending along the medial undersurface of the posterior calcaneus and the origin of the central cord of the plantar fascia. This collection demonstrates tapering extension towards the overlying soft tissue wound at the medial heel, which could reflect early sinus tract formation. Marrow edema and T1 hypointensity of the underlying posterior inferior calcaneus is compatible with osteomyelitis. Mild degenerative changes throughout the hindfoot and midfoot. No joint effusion. Ligaments Medial and lateral ankle ligaments are intact. Muscles and Tendons Flexor, peroneal and extensor compartment tendons are intact. Increased T2 signal of the intrinsic foot musculature may reflect chronic denervation changes, however, myositis can not be excluded. Soft tissue Diffuse subcutaneous and soft tissue edema of the ankle. Subcutaneous edema extends along the dorsal hindfoot and midfoot. IMPRESSION: 1. Soft tissue wound extending along the medial heel. There is a 2.9 x 2.8 x 0.7 cm complex fluid collection most compatible with an abscess extending along the medial undersurface of the posterior calcaneus and the origin of the central cord of the plantar fascia. Possible early sinus tract formation to the overlying soft tissue wound at the medial heel. 2. 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 3. Increased signal of the intrinsic foot musculature may reflect chronic denervation changes, however, myositis can not be excluded. 4. Diffuse subcutaneous and soft tissue edema of the ankle. Subcutaneous edema extends along the dorsal hindfoot and midfoot. Electronically Signed   By: Harrietta Sherry M.D.    On: 11/05/2024 11:20   MR FOOT RIGHT WO CONTRAST Result Date: 11/05/2024 CLINICAL DATA:  Ulceration at the lateral midfoot. Concern for osteomyelitis. EXAM: MRI OF THE RIGHT FOREFOOT WITHOUT CONTRAST TECHNIQUE: Multiplanar, multisequence MR imaging of the right foot was performed. No intravenous contrast was administered. COMPARISON:  None Available. FINDINGS: Bones/Joint/Cartilage Ulceration at the lateral midfoot overlying the level of the fifth metatarsal base. There is marrow edema of the underlying lateral half of the fifth metatarsal base without evidence of significant corresponding confluent T1 hypointensity. These findings could reflect early/mild osteomyelitis versus reactive marrow changes. Mild degenerative changes throughout the forefoot and midfoot. No joint effusion. Ligaments Collateral ligaments are intact.  Lisfranc ligament is intact. Muscles and Tendons Increased signal of the intrinsic forefoot musculature may reflect chronic denervation changes, however, myositis can not be excluded. No appreciable acute tendinous abnormality. Soft tissue No loculated fluid  collection. Subcutaneous edema of the dorsal forefoot. IMPRESSION: 1. Ulceration at the lateral midfoot overlying the fifth metatarsal base. Marrow edema of the underlying lateral half of the fifth metatarsal base could reflect early/mild osteomyelitis versus reactive marrow changes. 2. No fluid collection. 3. Increased signal of the intrinsic forefoot musculature may reflect chronic denervation changes, however, myositis can not be excluded. 4. Subcutaneous edema of the dorsal forefoot. Electronically Signed   By: Harrietta Sherry M.D.   On: 11/05/2024 10:55      LOS: 3 days    Elgin Lam, MD [facility] 11/07/2024, 8:47 AM   If 7PM-7AM, please contact night-coverage [email or web address]  "

## 2024-11-07 NOTE — Progress Notes (Signed)
 "  PODIATRY PROGRESS NOTE Patient Name: Patrick [name]  DOB Sep 18, 1935 DOA 11/03/2024  Hospital Day: 5  Assessment:  89 y.o. male with PMHx significant for DM2 with neuropathy and PVD suspected with infected ulceration right lateral midfoot at the fifth metatarsal base concern for underlying osteomyelitis as well as necrotic eschar left heel with concern for underlying abscess or osteomyelitis as well as critical limb ischemia bilateral lower extremity   WBC  23.1 ESR  62, CRP 22.3 MRI Left Heel: abscess right medial heel, osteomyelitis posterior inferior calcaneus MRI R foot: Ulcer R lateral midfoot with underlying osteomyelitis lateral 5th met base  Plan:  - Reviewed MRI - significant burden of osseous infection noted in the posterior plantar calcaneus on the left foot. Unfortuantely I do not feel he is a candidate for salvage of the foot given extent of osseous infection. Option would be to try local debridement/ partial calcanectomy and vac therapy though given his only arterial supply is via ant tibial artery there is currently not direct perfusion to the medial heel further limiting chance of healing any local debridement.  - Appreciate vascular surgery, discussed case with Dr. Magda. Plan for angio RLE on Monday. - On the right side patient will need R foot wound debridement, partial 5th met resection of the base, posssible abx beads/ wound vac - Dr. Lamount to round over the weekend and continue to discuss options.   - Continue IV abx broad spectrum pending further culture data - Anticoagulation: Defer to vascular recommendation - Wound care: Recommend Betadine and Band-Aid dressing to right foot - WB status: Weightbearing as tolerated - Will continue to follow        Marolyn JULIANNA Honour, DPM [facility]    Subjective:  Saw patient in pacu area after his angiogram. Discussed MRI findings on both feet. Discussed likely need for proximal amputation on the left  given calcaneal osteomyelitis and abscess. He was tearful. Also discussed that surgery needed to clear infeciton on the right foot pending vascular angiogram RLE. Discussed I would speak with his son to discuss further.   Objective:   Vitals:   11/07/24 1245 11/07/24 1300  BP: (!) 150/77 (!) 145/76  Pulse: 69 63  Resp:    Temp:    SpO2: 96% 99%       Latest Ref Rng & Units 11/06/2024    7:58 AM 11/04/2024   12:07 PM 11/03/2024    7:40 PM  CBC  WBC 4.0 - 10.5 K/uL 23.1  23.1  20.2   Hemoglobin 13.0 - 17.0 g/dL 88.2  86.5  87.2   Hematocrit 39.0 - 52.0 % 34.3  39.8  37.5   Platelets 150 - 400 K/uL 653  717  733        Latest Ref Rng & Units 11/06/2024    7:58 AM 11/04/2024    8:30 PM 11/03/2024    7:40 PM  BMP  Glucose 70 - 99 mg/dL 896  772  871   BUN 8 - 23 mg/dL 12  19  20    Creatinine 0.61 - 1.24 mg/dL 8.94  8.82  8.86   Sodium 135 - 145 mmol/L 135  134  133   Potassium 3.5 - 5.1 mmol/L 4.4  4.0  4.1   Chloride 98 - 111 mmol/L 100  99  101   CO2 22 - 32 mmol/L 20  23  21    Calcium 8.9 - 10.3 mg/dL 8.9  9.2  9.1  General: AAOx3, NAD  Lower Extremity Exam  LEFT: Decreased erytehma edema from prior of foot and leg though still moderated  DP 2+, non palpable PT  Large necrotic oval area of the left medial heel with pain on palpation with darkening of the skin, no active drainage, similar to prior     RIGHT:  Lateral 5th met base ulceration with fibrotic tissue present Malodor present Non palpable DP/PT      Radiology:  Results reviewed. See assessment for pertinent imaging results  "

## 2024-11-07 NOTE — Progress Notes (Signed)
 Bilateral lower extremity venous duplex has been completed. Preliminary results can be found in CV Proc through chart review.   11/07/2024 3:47 PM Cathlyn Collet RVT

## 2024-11-07 NOTE — Op Note (Addendum)
 DATE OF SERVICE: 11/07/2024  PATIENT:  Patrick [name]  89 y.o. 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 Magda, MD  ASSISTANT: none  ANESTHESIA:   local and IV sedation  ESTIMATED BLOOD LOSS: min  LOCAL MEDICATIONS USED:  LIDOCAINE    COUNTS: confirmed correct.  PATIENT DISPOSITION:  PACU - hemodynamically stable.   Delay start of Pharmacological VTE agent (>24hrs) due to surgical blood loss or risk of bleeding: no  INDICATION FOR PROCEDURE: Patrick [name] is a 89 y.o. male with bilateral lower extremity ischemic wounds that are limb threatening. After careful discussion of risks, benefits, and alternatives the patient was offered angiogram. The patient understood and wished to proceed.  OPERATIVE FINDINGS:   Aortogram findings: Renal arteries Left: patent  Right: patent Infrarenal aorta: patent Common iliac arteries: Left: patent  Right: patent Internal iliac arteries: Left: patent  Right: patent External iliac arteries: Left: patent  Right: patent  Left Lower Extremity Angiogram findings:  Common femoral artery: patent  Profunda femoris artery: patent  Superficial femoral artery: patent  Popliteal artery: patent  Anterior tibial artery: heavily diseased. Multifocal subtotal occlusion and calcification. Complex lesion.  Tibioperoneal trunk: patent   Peroneal artery: heavily diseased; patent proximally but then occludes shortly after origin  Posterior  tibial artery: occluded  Pedal circulation: Stenosis noted in the dorsalis pedis artery - separate from anterior tibial artery with calcification and severe stenosis (greatest 70%).  Pedal arch not intact; pedal flow otherwise mildly disadvantaged  GLASS score. FP: 0. IP: 3  WIfI score. Wound: 3; ischemia: 3; infection: 2. Stage: 4  DESCRIPTION OF PROCEDURE: After identification of the patient in the pre-operative holding area, the patient was transferred to the operating room. The patient was positioned supine on the operating room table.  Anesthesia was induced. The groins was prepped and draped in standard fashion. A surgical pause was performed confirming correct patient, procedure, and operative location.  The right groin was anesthetized with subcutaneous injection of 1% lidocaine . Using ultrasound guidance, the right common femoral artery was accessed with micropuncture technique.  Fluoroscopy was used to confirm cannulation over the femoral head. The 63F micropuncture sheath was upsized to 102F.   A Benson wire was advanced into the distal aorta. Over the wire an omni flush catheter was advanced to the level of L2. Aortogram was performed - see above for details.   The left common iliac artery was selected with an omniflush catheter and versacore guidewire. The wire was advanced into the common femoral artery. Over the wire the omni flush catheter was advanced into the external iliac artery. Selective angiography was performed - see above for details.   The decision was made to intervene. The patient was heparinized with 8000 units of heparin . The 102F sheath was exchanged for a 102F x 90cm sheath. Selective angiography of the left lower extremity performed prior to intervention from the popliteal artery.   Complex lesion was encountered in the anterior tibial artery and the dorsalis pedis artery.  The lesions were crossed with a V18 wire.  The  lesions were treated with: Angioplasty with 3 x 150  mm balloon to the dorsalis pedis artery and the anterior tibial artery.  Completion angiography revealed:  Resolution of stenosis and inline flow to the foot.  A Mynx was used to close the arteriotomy. Hemostasis was excellent upon completion.   Conscious sedation was administered with the use of IV fentanyl  and midazolam  under continuous physician and nurse monitoring.  Heart rate, blood pressure, and oxygen saturation were continuously monitored.  Total sedation time was 37 minutes  Upon completion of the case instrument and sharps counts were confirmed correct. The patient was transferred to the PACU in good condition. I was present for all portions of the procedure.  PLAN: Aspirin , Plavix , statin therapies.  His left lower extremity is optimized.  Right lower extremity angiogram on Monday to evaluate and treat chronic limb threatening ischemia.  Debby SAILOR. Magda, MD [facility] Phone Number: [phone] 11/07/2024 9:49 AM

## 2024-11-08 ENCOUNTER — Encounter (HOSPITAL_COMMUNITY): Payer: Self-pay | Admitting: Internal Medicine

## 2024-11-08 ENCOUNTER — Other Ambulatory Visit: Payer: Self-pay

## 2024-11-08 DIAGNOSIS — L0390 Cellulitis, unspecified: Secondary | ICD-10-CM | POA: Diagnosis not present

## 2024-11-08 DIAGNOSIS — A419 Sepsis, unspecified organism: Secondary | ICD-10-CM | POA: Diagnosis not present

## 2024-11-08 DIAGNOSIS — Z79899 Other long term (current) drug therapy: Secondary | ICD-10-CM

## 2024-11-08 DIAGNOSIS — Z7902 Long term (current) use of antithrombotics/antiplatelets: Secondary | ICD-10-CM

## 2024-11-08 DIAGNOSIS — Z9889 Other specified postprocedural states: Secondary | ICD-10-CM

## 2024-11-08 DIAGNOSIS — L97429 Non-pressure chronic ulcer of left heel and midfoot with unspecified severity: Secondary | ICD-10-CM

## 2024-11-08 LAB — CBC
HCT: 31.7 % — ABNORMAL LOW (ref 39.0–52.0)
Hemoglobin: 10.9 g/dL — ABNORMAL LOW (ref 13.0–17.0)
MCH: 27.5 pg (ref 26.0–34.0)
MCHC: 34.4 g/dL (ref 30.0–36.0)
MCV: 79.8 fL — ABNORMAL LOW (ref 80.0–100.0)
Platelets: 668 K/uL — ABNORMAL HIGH (ref 150–400)
RBC: 3.97 MIL/uL — ABNORMAL LOW (ref 4.22–5.81)
RDW: 15 % (ref 11.5–15.5)
WBC: 16.4 K/uL — ABNORMAL HIGH (ref 4.0–10.5)
nRBC: 0 % (ref 0.0–0.2)

## 2024-11-08 LAB — GLUCOSE, CAPILLARY
Glucose-Capillary: 127 mg/dL — ABNORMAL HIGH (ref 70–99)
Glucose-Capillary: 131 mg/dL — ABNORMAL HIGH (ref 70–99)
Glucose-Capillary: 134 mg/dL — ABNORMAL HIGH (ref 70–99)
Glucose-Capillary: 127 mg/dL — ABNORMAL HIGH (ref 70–99)

## 2024-11-08 LAB — LIPID PANEL
Cholesterol: 111 mg/dL (ref 0–200)
HDL: 35 mg/dL — ABNORMAL LOW
LDL Cholesterol: 55 mg/dL (ref 0–99)
Total CHOL/HDL Ratio: 3.2 ratio
Triglycerides: 106 mg/dL
VLDL: 21 mg/dL (ref 0–40)

## 2024-11-08 NOTE — Progress Notes (Signed)
 Pt has been confused today. He did not take oral medications this morning. He is refusing to take IV antibiotics as well. Pt says he has not been eating and has not seen a doctor. He does not remember talking to doctor or this RN this morning. Ann Nena Hoard, RN

## 2024-11-08 NOTE — Progress Notes (Signed)
 PHARMACIST LIPID MONITORING   Patrick [name] is a 89 y.o. male admitted on 11/03/2024 with bilateral LE ischemic ulceration and osteomyelitis. Patient s/p left LE angiogram and angioplasty, planning for right angiogram on Monday, 1/5. Pharmacy has been consulted to optimize lipid-lowering therapy with the indication of secondary prevention for clinical ASCVD.  Recent Labs:  Lipid Panel (last 6 months):   Lab Results  Component Value Date   CHOL 111 11/08/2024   TRIG 106 11/08/2024   HDL 35 (L) 11/08/2024   CHOLHDL 3.2 11/08/2024   VLDL 21 11/08/2024   LDLCALC 55 11/08/2024    Hepatic function panel (last 6 months):   Lab Results  Component Value Date   AST 25 11/06/2024   ALT 5 11/06/2024   ALKPHOS 72 11/06/2024   BILITOT 0.5 11/06/2024    SCr (since admission):   Serum creatinine: 1.05 mg/dL 98/98/73 9241 Estimated creatinine clearance: 48 mL/min  Current therapy and lipid therapy tolerance Current lipid-lowering therapy: rosuvastatin  20 mg daily (initiated this admission) Previous lipid-lowering therapies (if applicable): None documented Documented or reported allergies or intolerances to lipid-lowering therapies (if applicable): None documented.  Assessment:   Patient is an 89 yoM with PVD s/p LE angioplasty. LDL 55 (at goal <70) without prior statin therapy.  Plan:   Continue rosuvastatin  20 mg PO daily   Katie Eulah Walkup, PharmD PGY1 Pharmacy Resident

## 2024-11-08 NOTE — Progress Notes (Signed)
 "  PROGRESS NOTE    [name]  FMW:[id number] DOB: July 22, 1935 DOA: 11/03/2024 PCP: Patrick Leni Edyth DELENA, MD   Brief Narrative: Patrick [name] is a 89 y.o. male with a history of dementia, diabetes mellitus type 2, BPH/urinary retention s/p suprapubic catheter, pancreatic mass.  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 Vascular surgery and podiatry consulted. Empiric IV antibiotics started. Patient underwent angiogram with left dorsalis pedis and left anterior tibial angioplasty.   Assessment and Plan:  Sepsis Present on admission. Secondary to foot infection. Blood cultures obtained and patient started empirically on antibiotics. - Follow blood cultures - Continue antibiotics  Left foot abscess/osteomyelitis/myositis Noted on MRI. Podiatry and vascular surgery consulted on admission. Empiric Linezolid , Ceftriaxone  and Unasyn  started with transition to linezolid , cefepime  and metronidazole . Podiatry considering left below knee amputation. - Continue linezolid , cefepime  and metronidazole  - Follow-up podiatry recommendations: pending today  Right foot fifth metatarsal osteomyelitis Noted on MRI. Podiatry with plan for possible 5th ray resection I&D. - Follow-up podiatry recommendations: pending today - Continue antibiotics  Possible critical limb ischemia of bilateral lower extremity Vascular surgery consulted for management. ABIs performed on 1/1 and are significant for moderate bilateral disease. Patient underwent angiogram on 1/2, with vascular performing left dorsalis pedis and left anterior tibial angioplasty. Patient started on aspirin  and Plavix . - Vascular surgery recommendations: right LE angiogram on 1/5  Diabetes mellitus type 2 Well controlled based on hemoglobin A1C of 6.5%. - Continue SSI  BPH Urinary retention Patient is s/p history  of suprapubic catheter placement. Patient follows with urology as an outpatient.  Dementia Appears to be mild, however patient has some delirium today - Delirium precautions - Will consider Seroquel at night if patient has continued symptoms   DVT prophylaxis: Lovenox  Code Status:   Code Status: Full Code Family Communication: None at bedside. Called son but no response Disposition Plan: Discharge pending ongoing specialist recommendations and transition to outpatient antibiotic regimen as required   Consultants:  Vascular surgery Podiatry  Procedures:  LLE Angiogram  Antimicrobials: Linezolid  Ceftriaxone  Cefepime  Unasyn  Flagyl     Subjective: Patient is confused today.  Objective: BP 117/63 (BP Location: Right Arm)   Pulse 72   Temp 98.5 F (36.9 C) (Oral)   Resp 20   Ht 5' 8 (1.727 m)   Wt 81.6 kg   SpO2 92%   BMI 27.35 kg/m   Examination:  General exam: Appears calm and comfortable. Respiratory system: Clear to auscultation. Respiratory effort normal. Cardiovascular system: S1 & S2 heard, RRR. Gastrointestinal system: Abdomen is nondistended, soft and nontender. Normal bowel sounds heard. Central nervous system: Alert and oriented to person.   Data Reviewed: I have personally reviewed following labs and imaging studies  CBC Lab Results  Component Value Date   WBC 16.4 (H) 11/08/2024   RBC 3.97 (L) 11/08/2024   HGB 10.9 (L) 11/08/2024   HCT 31.7 (L) 11/08/2024   MCV 79.8 (L) 11/08/2024   MCH 27.5 11/08/2024   PLT 668 (H) 11/08/2024   MCHC 34.4 11/08/2024   RDW 15.0 11/08/2024   LYMPHSABS 4.5 (H) 11/04/2024   MONOABS 1.1 (H) 11/04/2024   EOSABS 0.1 11/04/2024   BASOSABS 0.1 11/04/2024     Last metabolic panel Lab Results  Component Value Date   NA 135 11/06/2024   K 4.4 11/06/2024   CL 100 11/06/2024   CO2 20 (L) 11/06/2024  BUN 12 11/06/2024   CREATININE 1.05 11/06/2024   GLUCOSE 103 (H) 11/06/2024   GFRNONAA >60 11/06/2024    CALCIUM  8.9 11/06/2024   PHOS 2.6 11/04/2024   PROT 7.7 11/06/2024   ALBUMIN 3.5 11/06/2024   BILITOT 0.5 11/06/2024   ALKPHOS 72 11/06/2024   AST 25 11/06/2024   ALT 5 11/06/2024   ANIONGAP 14 11/06/2024    GFR: Estimated Creatinine Clearance: 48 mL/min (by C-G formula based on SCr of 1.05 mg/dL).  Recent Results (from the past 240 hours)  Blood culture (routine x 2)     Status: None (Preliminary result)   Collection Time: 11/04/24  8:30 PM   Specimen: BLOOD LEFT ARM  Result Value Ref Range Status   Specimen Description   Final    BLOOD LEFT ARM Performed at [facility], [address] [place], [place] [address]    Special Requests   Final    BOTTLES DRAWN AEROBIC AND ANAEROBIC Blood Culture adequate volume Performed at [facility], [address] [place], [place] [address]    Culture   Final    NO GROWTH 2 DAYS Performed at [facility], [address] [place], [place] [address]    Report Status PENDING  Incomplete      Radiology Studies: VAS US  LOWER EXTREMITY VENOUS (DVT) Result Date: 11/07/2024  Lower Venous DVT Study Patient Name:  Patrick [name]  Date of Exam:   11/07/2024 Medical Rec #: [id number]           Accession #:    [id number] Date of Birth: 02/06/1935           Patient Gender: M Patient Age:   18 years Exam Location:  [facility] Procedure:      VAS US  LOWER EXTREMITY VENOUS (DVT) Referring Phys: Patrick [name] --------------------------------------------------------------------------------  Indications: Edema.  Risk Factors: None identified. Limitations: Patient positioning and poor ultrasound/tissue interface. Comparison Study: No prior studies. Performing Technologist: Cordella Collet RVT  Examination Guidelines: A complete evaluation includes B-mode imaging, spectral Doppler, color Doppler, and power Doppler as needed of all accessible portions of each vessel. Bilateral testing is considered an integral  part of a complete examination. Limited examinations for reoccurring indications may be performed as noted. The reflux portion of the exam is performed with the patient in reverse Trendelenburg.  +---------+---------------+---------+-----------+----------+--------------+ RIGHT    CompressibilityPhasicitySpontaneityPropertiesThrombus Aging +---------+---------------+---------+-----------+----------+--------------+ CFV      Full           Yes      Yes                                 +---------+---------------+---------+-----------+----------+--------------+ SFJ      Full                                                        +---------+---------------+---------+-----------+----------+--------------+ FV Prox  Full                                                        +---------+---------------+---------+-----------+----------+--------------+ FV Mid   Full                                                        +---------+---------------+---------+-----------+----------+--------------+  FV DistalFull                                                        +---------+---------------+---------+-----------+----------+--------------+ PFV      Full                                                        +---------+---------------+---------+-----------+----------+--------------+ POP      Full           Yes      Yes                                 +---------+---------------+---------+-----------+----------+--------------+ PTV      Full                                                        +---------+---------------+---------+-----------+----------+--------------+ PERO     Full                                                        +---------+---------------+---------+-----------+----------+--------------+   +---------+---------------+---------+-----------+----------+--------------+ LEFT     CompressibilityPhasicitySpontaneityPropertiesThrombus Aging  +---------+---------------+---------+-----------+----------+--------------+ CFV      Full           Yes      Yes                                 +---------+---------------+---------+-----------+----------+--------------+ SFJ      Full                                                        +---------+---------------+---------+-----------+----------+--------------+ FV Prox  Full                                                        +---------+---------------+---------+-----------+----------+--------------+ FV Mid   Full                                                        +---------+---------------+---------+-----------+----------+--------------+ FV DistalFull                                                        +---------+---------------+---------+-----------+----------+--------------+  PFV      Full                                                        +---------+---------------+---------+-----------+----------+--------------+ POP      Full           Yes      Yes                                 +---------+---------------+---------+-----------+----------+--------------+ PTV      Full                                                        +---------+---------------+---------+-----------+----------+--------------+ PERO     Full                                                        +---------+---------------+---------+-----------+----------+--------------+    Summary: RIGHT: - There is no evidence of deep vein thrombosis in the lower extremity.  - No cystic structure found in the popliteal fossa.  LEFT: - There is no evidence of deep vein thrombosis in the lower extremity.  - No cystic structure found in the popliteal fossa.  *See table(s) above for measurements and observations.    Preliminary    PERIPHERAL VASCULAR CATHETERIZATION Result Date: 11/07/2024 DATE OF SERVICE: 11/07/2024  PATIENT:  Patrick [name]  89 y.o. 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 Magda, MD  ASSISTANT: none  ANESTHESIA:   local and IV sedation  ESTIMATED BLOOD LOSS: min  LOCAL MEDICATIONS USED:  LIDOCAINE   COUNTS: confirmed correct.  PATIENT DISPOSITION:  PACU - hemodynamically stable.  Delay start of Pharmacological VTE agent (>24hrs) due to surgical blood loss or risk of bleeding: no  INDICATION FOR PROCEDURE: Patrick [name] is a 89 y.o. male with bilateral lower extremity ischemic wounds that are limb threatening. After careful discussion of risks, benefits, and alternatives the patient was offered angiogram. The patient understood and wished to proceed.  OPERATIVE FINDINGS:  Aortogram: Renal arteries Left: patent      Right: patent Infrarenal aorta: patent Common iliac arteries: Left: patent      Right: patent Internal iliac arteries: Left: patent      Right: patent External iliac arteries: Left: patent      Right: patent  Left Lower Extremity Angiogram:             Common femoral artery: patent             Profunda femoris artery: patent  Superficial femoral artery: patent             Popliteal artery: patent             Anterior tibial artery: heavily diseased. Multifocal subtotal occlusion and calcification. Complex lesion.             Tibioperoneal trunk: patent             Peroneal artery: heavily diseased; patent proximally but then occludes shortly after origin             Posterior tibial artery: occluded             Pedal circulation: Stenosis noted in the dorsalis pedis  artery -similar to anterior tibial artery with calcification and severe stenosis (greatest 70%).  Pedal arch not intact; pedal flow otherwise mildly disadvantaged  GLASS score. FP: 0. IP: 3  WIfI score. Wound: 3; ischemia: 3; infection: 2. Stage: 4  DESCRIPTION OF PROCEDURE: After identification of the patient in the pre-operative holding area, the patient was transferred to the operating room. The patient was positioned supine on the operating room table.  Anesthesia was induced. The groins was prepped and draped in standard fashion. A surgical pause was performed confirming correct patient, procedure, and operative location.  The right groin was anesthetized with subcutaneous injection of 1% lidocaine . Using ultrasound guidance, the right common femoral artery was accessed with micropuncture technique.  Fluoroscopy was used to confirm cannulation over the femoral head. The 48F micropuncture sheath was upsized to 37F.  A Benson wire was advanced into the distal aorta. Over the wire an omni flush catheter was advanced to the level of L2. Aortogram was performed - see above for details.  The left common iliac artery was selected with an omniflush catheter and versacore guidewire. The wire was advanced into the common femoral artery. Over the wire the omni flush catheter was advanced into the external iliac artery. Selective angiography was performed - see above for details.  The decision was made to intervene. The patient was heparinized with 8000 units of heparin . The 37F sheath was exchanged for a 37F x 90cm sheath. Selective angiography of the left lower extremity performed prior to intervention from the popliteal artery.  Complex lesion was encountered in the anterior tibial artery and the dorsalis pedis artery.  The lesions were crossed with a V18 wire.  The lesions were treated with: Angioplasty with 3 x 150 mm balloon to the dorsalis pedis artery and the anterior tibial artery.  Completion angiography revealed:  Resolution of stenosis and inline flow to the foot.  A Mynx was used to close the arteriotomy. Hemostasis was excellent upon completion.  Conscious sedation was administered with the use of IV fentanyl  and midazolam  under continuous physician and nurse monitoring.  Heart rate, blood pressure, and oxygen saturation were continuously monitored.  Total sedation time was 37 minutes  Upon completion of the case instrument and sharps counts were confirmed correct. The patient was transferred to the PACU in good condition. I was present for all portions of the procedure.  PLAN: Aspirin , Plavix , statin therapies.  His left lower extremity is optimized.  Right lower extremity angiogram on Monday to evaluate and treat chronic limb threatening ischemia.  Debby SAILOR. Magda, MD [facility] Phone Number: [phone] 11/07/2024 9:49 AM   VAS US  ABI WITH/WO TBI Result Date: 11/06/2024  LOWER EXTREMITY DOPPLER STUDY Patient Name:  Patrick [name]  Date of Exam:  11/06/2024 Medical Rec #: [id number]           Accession #:    [id number] Date of Birth: 09/26/1935           Patient Gender: M Patient Age:   74 years Exam Location:  [facility] Procedure:      VAS US  ABI WITH/WO TBI Referring Phys: PENNE COLORADO --------------------------------------------------------------------------------  Indications: Peripheral artery disease. High Risk Factors: Past history of smoking.  Comparison Study: No previous exams Performing Technologist: Leigh Rom RVT/RDMS  Examination Guidelines: A complete evaluation includes at minimum, Doppler waveform signals and systolic blood pressure reading at the level of bilateral brachial, anterior tibial, and posterior tibial arteries, when vessel segments are accessible. Bilateral testing is considered an integral part of a complete examination. Photoelectric Plethysmograph (PPG) waveforms and toe systolic pressure readings are included as required and  additional duplex testing as needed. Limited examinations for reoccurring indications may be performed as noted.  ABI Findings: +---------+------------------+-----+----------+--------+ Right    Rt Pressure (mmHg)IndexWaveform  Comment  +---------+------------------+-----+----------+--------+ Brachial 148                    triphasic          +---------+------------------+-----+----------+--------+ PTA      105               0.71 monophasic         +---------+------------------+-----+----------+--------+ DP       85                0.57 monophasic         +---------+------------------+-----+----------+--------+ Great Toe50                0.34 Abnormal           +---------+------------------+-----+----------+--------+ +---------+------------------+-----+----------+-------+ Left     Lt Pressure (mmHg)IndexWaveform  Comment +---------+------------------+-----+----------+-------+ Brachial 140                    triphasic         +---------+------------------+-----+----------+-------+ PTA                             monophasic      +---------+------------------+-----+----------+-------+ DP       111               0.75 monophasic        +---------+------------------+-----+----------+-------+ Great Toe0                 0.00 Absent            +---------+------------------+-----+----------+-------+  Unable to tolerate cuff inflation for PTA pressure.  Summary: Right: Resting right ankle-brachial index indicates moderate right lower extremity arterial disease. The right toe-brachial index is abnormal.  Left: Resting left ankle-brachial index indicates moderate left lower extremity arterial disease. The left toe-brachial index is abnormal.  *See table(s) above for measurements and observations.     Preliminary       LOS: 4 days    Elgin Lam, MD [facility] 11/08/2024, 7:37 AM   If 7PM-7AM, please contact night-coverage [email or web address]  "

## 2024-11-08 NOTE — Progress Notes (Addendum)
" °  Progress Note    11/08/2024 9:29 AM 1 Day Post-Op  Subjective: Confused    Vitals:   11/08/24 0030 11/08/24 0454  BP: 115/85 117/63  Pulse: 80 72  Resp: 19 20  Temp: 98.5 F (36.9 C) 98.5 F (36.9 C)  SpO2: 100% 92%    Physical Exam: General: Sitting up in bed, no acute distress Cardiac: Regular Lungs: Nonlabored Incisions: Right groin access site bandaged without hematoma Extremities: Dry left heel ulceration.  Palpable left DP pulse   CBC    Component Value Date/Time   WBC 16.4 (H) 11/08/2024 0333   RBC 3.97 (L) 11/08/2024 0333   HGB 10.9 (L) 11/08/2024 0333   HCT 31.7 (L) 11/08/2024 0333   PLT 668 (H) 11/08/2024 0333   MCV 79.8 (L) 11/08/2024 0333   MCH 27.5 11/08/2024 0333   MCHC 34.4 11/08/2024 0333   RDW 15.0 11/08/2024 0333   LYMPHSABS 4.5 (H) 11/04/2024 1207   MONOABS 1.1 (H) 11/04/2024 1207   EOSABS 0.1 11/04/2024 1207   BASOSABS 0.1 11/04/2024 1207    BMET    Component Value Date/Time   NA 135 11/06/2024 0758   K 4.4 11/06/2024 0758   CL 100 11/06/2024 0758   CO2 20 (L) 11/06/2024 0758   GLUCOSE 103 (H) 11/06/2024 0758   BUN 12 11/06/2024 0758   CREATININE 1.05 11/06/2024 0758   CALCIUM  8.9 11/06/2024 0758   GFRNONAA >60 11/06/2024 0758    INR No results found for: INR   Intake/Output Summary (Last 24 hours) at 11/08/2024 9070 Last data filed at 11/08/2024 0506 Gross per 24 hour  Intake 1645.04 ml  Output 700 ml  Net 945.04 ml      Assessment/Plan:  89 y.o. male is 1 day postop, s/p: Left lower extremity angiogram with anterior tibial and dorsalis pedis angioplasty   - The patient is at his baseline this morning without any complaints -Right groin access site is soft without hematoma -Left lower extremity is maximally revascularized with a palpable DP pulse -Will plan for right lower extremity angiogram from a left common femoral approach in the Cath Lab on Monday -Continue aspirin , Plavix , and statin   Ahmed Holster,  PA-C [facility] [phone] 11/08/2024 9:29 AM  I have independently interviewed and examined patient and agree with PA assessment and plan above.  Successful left lower extremity revascularization however high risk for major amputation.  Will repeat angiography of the right lower extremity on Monday for possible limb salvage.  Akia Montalban C. Sheree, MD [facility] [place] Office: [phone] Pager: [phone]    "

## 2024-11-09 ENCOUNTER — Inpatient Hospital Stay (HOSPITAL_COMMUNITY)

## 2024-11-09 DIAGNOSIS — L0390 Cellulitis, unspecified: Secondary | ICD-10-CM | POA: Diagnosis not present

## 2024-11-09 DIAGNOSIS — R400 Somnolence: Secondary | ICD-10-CM

## 2024-11-09 DIAGNOSIS — A419 Sepsis, unspecified organism: Secondary | ICD-10-CM | POA: Diagnosis not present

## 2024-11-09 LAB — COMPREHENSIVE METABOLIC PANEL WITH GFR
ALT: 12 U/L (ref 0–44)
AST: 34 U/L (ref 15–41)
Albumin: 3.5 g/dL (ref 3.5–5.0)
Alkaline Phosphatase: 70 U/L (ref 38–126)
Anion gap: 11 (ref 5–15)
BUN: 16 mg/dL (ref 8–23)
CO2: 23 mmol/L (ref 22–32)
Calcium: 9 mg/dL (ref 8.9–10.3)
Chloride: 98 mmol/L (ref 98–111)
Creatinine, Ser: 1.05 mg/dL (ref 0.61–1.24)
GFR, Estimated: >60 mL/min
Glucose, Bld: 93 mg/dL (ref 70–99)
Potassium: 4.6 mmol/L (ref 3.5–5.1)
Sodium: 131 mmol/L — ABNORMAL LOW (ref 135–145)
Total Bilirubin: 0.3 mg/dL (ref 0.0–1.2)
Total Protein: 7.8 g/dL (ref 6.5–8.1)

## 2024-11-09 LAB — CBC
HCT: 34.1 % — ABNORMAL LOW (ref 39.0–52.0)
Hemoglobin: 11.6 g/dL — ABNORMAL LOW (ref 13.0–17.0)
MCH: 27.4 pg (ref 26.0–34.0)
MCHC: 34 g/dL (ref 30.0–36.0)
MCV: 80.4 fL (ref 80.0–100.0)
Platelets: 575 K/uL — ABNORMAL HIGH (ref 150–400)
RBC: 4.24 MIL/uL (ref 4.22–5.81)
RDW: 15.3 % (ref 11.5–15.5)
WBC: 16.4 K/uL — ABNORMAL HIGH (ref 4.0–10.5)
nRBC: 0 % (ref 0.0–0.2)

## 2024-11-09 LAB — GLUCOSE, CAPILLARY
Glucose-Capillary: 89 mg/dL (ref 70–99)
Glucose-Capillary: 87 mg/dL (ref 70–99)
Glucose-Capillary: 86 mg/dL (ref 70–99)
Glucose-Capillary: 92 mg/dL (ref 70–99)

## 2024-11-09 LAB — AMMONIA: Ammonia: 20 umol/L (ref 9–35)

## 2024-11-09 LAB — TSH: TSH: 1.56 u[IU]/mL (ref 0.350–4.500)

## 2024-11-09 LAB — VITAMIN B12: Vitamin B-12: 513 pg/mL (ref 180–914)

## 2024-11-09 LAB — FOLATE: Folate: 13.6 ng/mL

## 2024-11-09 MED ORDER — ASPIRIN 300 MG RE SUPP
300.0000 mg | Freq: Every day | RECTAL | Status: DC
  Administered 2024-11-09: 300 mg via RECTAL
  Filled 2024-11-09 (×2): qty 1

## 2024-11-09 NOTE — Plan of Care (Signed)
" °  Problem: Clinical Measurements: Goal: Ability to maintain clinical measurements within normal limits will improve Outcome: Progressing   Problem: Activity: Goal: Risk for activity intolerance will decrease Outcome: Progressing   Problem: Coping: Goal: Level of anxiety will decrease Outcome: Progressing   Problem: Safety: Goal: Ability to remain free from injury will improve Outcome: Progressing   Problem: Skin Integrity: Goal: Risk for impaired skin integrity will decrease Outcome: Progressing   Problem: Fluid Volume: Goal: Ability to maintain a balanced intake and output will improve Outcome: Progressing   Problem: Metabolic: Goal: Ability to maintain appropriate glucose levels will improve Outcome: Progressing   "

## 2024-11-09 NOTE — Progress Notes (Addendum)
 "  PODIATRY PROGRESS NOTE Patient Name: Patrick [name]  DOB 28-Oct-1935 DOA 11/03/2024  Hospital Day: 7  Assessment:  89 y.o. male with PMHx significant for DM2 with neuropathy and PVD suspected with infected ulceration right lateral midfoot at the fifth metatarsal base concern for underlying osteomyelitis as well as necrotic eschar left heel with concern for underlying abscess or osteomyelitis as well as critical limb ischemia bilateral lower extremity   WBC 16.4, downtrending ESR  62, CRP 22.3 MRI Left Heel: abscess right medial heel, osteomyelitis posterior inferior calcaneus MRI R foot: Ulcer R lateral midfoot with underlying osteomyelitis lateral 5th met base  Plan:  - Patient undergoing right lower extremity angiogram 1/5 - On right foot given successful vascular invention, likely plan for debridement, partial fifth metatarsal base resection, possible antibiotic beads and wound VAC -Regarding left heel, given extent of infection on MRI and lack of PT pulse, do not think left foot has good potential for salvage. Do think patient will need more proximal amputation. Have discussed this with son previously but will need to give consent going forward. - Continue IV abx broad spectrum pending further culture data - Anticoagulation: Defer to vascular recommendation - Wound care: Recommend Betadine and Band-Aid dressing to right foot - WB status: Weightbearing as tolerated - Will continue to follow        Delrick Dehart L. Lamount DPM [facility]    Subjective:  Patient seen at bedside today.  Somnolent at the time of my examination.  Otherwise seems comfortable.  He is undergoing right lower extremity angiogram tomorrow on 1/5.  Objective:   Vitals:   11/09/24 1051 11/09/24 1614  BP: (!) 153/63 133/71  Pulse:  66  Resp: 18 17  Temp: 98 F (36.7 C) 98.2 F (36.8 C)  SpO2:  96%       Latest Ref Rng & Units 11/09/2024    3:58 PM 11/08/2024    3:33 AM 11/06/2024    7:58 AM   CBC  WBC 4.0 - 10.5 K/uL 16.4  16.4  23.1   Hemoglobin 13.0 - 17.0 g/dL 88.3  89.0  88.2   Hematocrit 39.0 - 52.0 % 34.1  31.7  34.3   Platelets 150 - 400 K/uL 575  668  653        Latest Ref Rng & Units 11/09/2024    3:58 PM 11/06/2024    7:58 AM 11/04/2024    8:30 PM  BMP  Glucose 70 - 99 mg/dL 93  896  772   BUN 8 - 23 mg/dL 16  12  19    Creatinine 0.61 - 1.24 mg/dL 8.94  8.94  8.82   Sodium 135 - 145 mmol/L 131  135  134   Potassium 3.5 - 5.1 mmol/L 4.6  4.4  4.0   Chloride 98 - 111 mmol/L 98  100  99   CO2 22 - 32 mmol/L 23  20  23    Calcium  8.9 - 10.3 mg/dL 9.0  8.9  9.2     General: AAOx3, NAD  Lower Extremity Exam  LEFT: Decreased erytehma edema from prior of foot and leg though still moderated  DP 2+, non palpable PT  L foot necrotic area medial heel unchanged from previous.  Is tender on palpation.    RIGHT:  Right lateral midfoot fibronecrotic drainage over area fifth met base as previously pictured below.  Overall unchanged.  Limited surrounding edema.  Mild erythema to surrounding margin.  Radiology:  Results reviewed. See assessment for pertinent imaging results  "

## 2024-11-09 NOTE — Progress Notes (Addendum)
" °  Progress Note    11/09/2024 9:12 AM 2 Days Post-Op  Subjective: Resting    Vitals:   11/09/24 0337 11/09/24 0813  BP: 136/70 139/69  Pulse: 62 63  Resp: (!) 22 19  Temp: 97.6 F (36.4 C) 98.4 F (36.9 C)  SpO2: 100% 100%    Physical Exam: General: Resting, no acute distress Cardiac: Regular Lungs: Nonlabored Incisions: Right groin access site soft without hematoma Extremities: Palpable left DP pulse   CBC    Component Value Date/Time   WBC 16.4 (H) 11/08/2024 0333   RBC 3.97 (L) 11/08/2024 0333   HGB 10.9 (L) 11/08/2024 0333   HCT 31.7 (L) 11/08/2024 0333   PLT 668 (H) 11/08/2024 0333   MCV 79.8 (L) 11/08/2024 0333   MCH 27.5 11/08/2024 0333   MCHC 34.4 11/08/2024 0333   RDW 15.0 11/08/2024 0333   LYMPHSABS 4.5 (H) 11/04/2024 1207   MONOABS 1.1 (H) 11/04/2024 1207   EOSABS 0.1 11/04/2024 1207   BASOSABS 0.1 11/04/2024 1207    BMET    Component Value Date/Time   NA 135 11/06/2024 0758   K 4.4 11/06/2024 0758   CL 100 11/06/2024 0758   CO2 20 (L) 11/06/2024 0758   GLUCOSE 103 (H) 11/06/2024 0758   BUN 12 11/06/2024 0758   CREATININE 1.05 11/06/2024 0758   CALCIUM  8.9 11/06/2024 0758   GFRNONAA >60 11/06/2024 0758    INR No results found for: INR   Intake/Output Summary (Last 24 hours) at 11/09/2024 0912 Last data filed at 11/09/2024 0600 Gross per 24 hour  Intake 200 ml  Output 1100 ml  Net -900 ml      Assessment/Plan:  89 y.o. male is 2 days postop, s/p: Left lower extremity angiogram with anterior tibial and dorsalis pedis angioplasty    - He is resting comfortably this morning without any complaints -Left lower extremity remains well-perfused with a palpable DP pulse after angiography -Will plan on right lower extremity angiogram from the left common femoral approach tomorrow in the Cath Lab -Continue aspirin , Plavix , and statin -Will make n.p.o. at midnight   Ahmed Holster, PA-C [facility] [phone] 11/09/2024 9:12 AM  I have independently interviewed and examined patient and agree with PA assessment and plan above.  Continues to have palpable left dorsalis pedis pulse although high risk for major amputation on the left given heel ulceration.  Plan for repeat angiography of the right lower extremity possible revascularization to salvage right foot where he has likely osteo of the right fifth metatarsal.  I have attempted to reach out the patient's family via telephone but was unable to reach them.  Rheanna Sergent C. Sheree, MD [facility] [place] Office: [phone] Pager: [phone]    "

## 2024-11-09 NOTE — Progress Notes (Addendum)
 "  PROGRESS NOTE    Patrick [name]  FMW:[id number] DOB: 02-Jan-1935 DOA: 11/03/2024 PCP: Maree Leni Edyth DELENA, MD   Brief Narrative: Patrick [name] is a 89 y.o. male with a history of dementia, diabetes mellitus type 2, BPH/urinary retention s/p suprapubic catheter, pancreatic mass.  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 Vascular surgery and podiatry consulted. Empiric IV antibiotics started. Patient underwent angiogram with left dorsalis pedis and left anterior tibial angioplasty.   Assessment and Plan:  Sepsis Present on admission. Secondary to foot infection. Blood cultures obtained and patient started empirically on antibiotics. - Follow blood cultures - Continue antibiotics  Left foot abscess/osteomyelitis/myositis Noted on MRI. Podiatry and vascular surgery consulted on admission. Empiric Linezolid , Ceftriaxone  and Unasyn  started with transition to linezolid , cefepime  and metronidazole . Podiatry considering left below knee amputation. - Continue linezolid , cefepime  and metronidazole  - Follow-up podiatry recommendations: pending today  Right foot fifth metatarsal osteomyelitis Noted on MRI. Podiatry with plan for possible 5th ray resection I&D. - Follow-up podiatry recommendations: pending today - Continue antibiotics  Possible critical limb ischemia of bilateral lower extremity Vascular surgery consulted for management. ABIs performed on 1/1 and are significant for moderate bilateral disease. Patient underwent angiogram on 1/2, with vascular performing left dorsalis pedis and left anterior tibial angioplasty. Patient started on aspirin  and Plavix . - Vascular surgery recommendations: right LE angiogram on 1/5  Diabetes mellitus type 2 Well controlled based on hemoglobin A1C of 6.5%. - Continue SSI  BPH Urinary retention Patient is s/p history  of suprapubic catheter placement. Patient follows with urology as an outpatient.  Dementia Appears to be mild, however patient developed some evidence of delirium on 1/3. Patient is already on Risperdal . - Delirium precautions - Continue Risperdal   Addendum: Somnolence Significant somnolence today. Patient is responsive but does not sustain alertness. Complicated by history of dementia and likely recent delirium. No obvious focal deficits identified. - Check head CT - Check CBC, CMP, ammonia, TSH, B1, B12   DVT prophylaxis: Lovenox  Code Status:   Code Status: Full Code Family Communication: None at bedside. Disposition Plan: Discharge pending ongoing specialist recommendations and transition to outpatient antibiotic regimen as required   Consultants:  Vascular surgery Podiatry  Procedures:  LLE Angiogram  Antimicrobials: Linezolid  Ceftriaxone  Cefepime  Unasyn  Flagyl     Subjective: Some confusion overnight.  Objective: BP 139/69 (BP Location: Left Arm)   Pulse 63   Temp 98.4 F (36.9 C) (Oral)   Resp 19   Ht 5' 8 (1.727 m)   Wt 81.6 kg   SpO2 100%   BMI 27.35 kg/m   Examination:  General exam: Appears calm and comfortable. Resting in bed. Respiratory system: Respiratory effort normal.    Data Reviewed: I have personally reviewed following labs and imaging studies  CBC Lab Results  Component Value Date   WBC 16.4 (H) 11/08/2024   RBC 3.97 (L) 11/08/2024   HGB 10.9 (L) 11/08/2024   HCT 31.7 (L) 11/08/2024   MCV 79.8 (L) 11/08/2024   MCH 27.5 11/08/2024   PLT 668 (H) 11/08/2024   MCHC 34.4 11/08/2024   RDW 15.0 11/08/2024   LYMPHSABS 4.5 (H) 11/04/2024   MONOABS 1.1 (H) 11/04/2024   EOSABS 0.1 11/04/2024   BASOSABS 0.1 11/04/2024     Last metabolic panel Lab Results  Component Value Date   NA 135 11/06/2024   K 4.4 11/06/2024   CL 100 11/06/2024  CO2 20 (L) 11/06/2024   BUN 12 11/06/2024   CREATININE 1.05 11/06/2024   GLUCOSE 103 (H)  11/06/2024   GFRNONAA >60 11/06/2024   CALCIUM  8.9 11/06/2024   PHOS 2.6 11/04/2024   PROT 7.7 11/06/2024   ALBUMIN 3.5 11/06/2024   BILITOT 0.5 11/06/2024   ALKPHOS 72 11/06/2024   AST 25 11/06/2024   ALT 5 11/06/2024   ANIONGAP 14 11/06/2024    GFR: Estimated Creatinine Clearance: 48 mL/min (by C-G formula based on SCr of 1.05 mg/dL).  Recent Results (from the past 240 hours)  Blood culture (routine x 2)     Status: None (Preliminary result)   Collection Time: 11/04/24  8:30 PM   Specimen: BLOOD LEFT ARM  Result Value Ref Range Status   Specimen Description   Final    BLOOD LEFT ARM Performed at [facility], [address] [place], [place] [address]    Special Requests   Final    BOTTLES DRAWN AEROBIC AND ANAEROBIC Blood Culture adequate volume Performed at [facility], [address] [place], [place] [address]    Culture   Final    NO GROWTH 4 DAYS Performed at [facility], [address], [place], [place] [address]    Report Status PENDING  Incomplete      Radiology Studies: VAS US  LOWER EXTREMITY VENOUS (DVT) Result Date: 11/08/2024  Lower Venous DVT Study Patient Name:  Patrick [name]  Date of Exam:   11/07/2024 Medical Rec #: [id number]           Accession #:    [id number] Date of Birth: [birth year]           Patient Gender: M Patient Age:   78 years Exam Location:  [facility] Procedure:      VAS US  LOWER EXTREMITY VENOUS (DVT) Referring Phys: ERIC AUSTRIA --------------------------------------------------------------------------------  Indications: Edema.  Risk Factors: None identified. Limitations: Patient positioning and poor ultrasound/tissue interface. Comparison Study: No prior studies. Performing Technologist: Cordella Collet RVT  Examination Guidelines: A complete evaluation includes B-mode imaging, spectral Doppler, color Doppler, and power Doppler as needed of all accessible portions of each vessel.  Bilateral testing is considered an integral part of a complete examination. Limited examinations for reoccurring indications may be performed as noted. The reflux portion of the exam is performed with the patient in reverse Trendelenburg.  +---------+---------------+---------+-----------+----------+--------------+ RIGHT    CompressibilityPhasicitySpontaneityPropertiesThrombus Aging +---------+---------------+---------+-----------+----------+--------------+ CFV      Full           Yes      Yes                                 +---------+---------------+---------+-----------+----------+--------------+ SFJ      Full                                                        +---------+---------------+---------+-----------+----------+--------------+ FV Prox  Full                                                        +---------+---------------+---------+-----------+----------+--------------+  FV Mid   Full                                                        +---------+---------------+---------+-----------+----------+--------------+ FV DistalFull                                                        +---------+---------------+---------+-----------+----------+--------------+ PFV      Full                                                        +---------+---------------+---------+-----------+----------+--------------+ POP      Full           Yes      Yes                                 +---------+---------------+---------+-----------+----------+--------------+ PTV      Full                                                        +---------+---------------+---------+-----------+----------+--------------+ PERO     Full                                                        +---------+---------------+---------+-----------+----------+--------------+   +---------+---------------+---------+-----------+----------+--------------+ LEFT      CompressibilityPhasicitySpontaneityPropertiesThrombus Aging +---------+---------------+---------+-----------+----------+--------------+ CFV      Full           Yes      Yes                                 +---------+---------------+---------+-----------+----------+--------------+ SFJ      Full                                                        +---------+---------------+---------+-----------+----------+--------------+ FV Prox  Full                                                        +---------+---------------+---------+-----------+----------+--------------+ FV Mid   Full                                                        +---------+---------------+---------+-----------+----------+--------------+  FV DistalFull                                                        +---------+---------------+---------+-----------+----------+--------------+ PFV      Full                                                        +---------+---------------+---------+-----------+----------+--------------+ POP      Full           Yes      Yes                                 +---------+---------------+---------+-----------+----------+--------------+ PTV      Full                                                        +---------+---------------+---------+-----------+----------+--------------+ PERO     Full                                                        +---------+---------------+---------+-----------+----------+--------------+     Summary: RIGHT: - There is no evidence of deep vein thrombosis in the lower extremity.  - No cystic structure found in the popliteal fossa.  LEFT: - There is no evidence of deep vein thrombosis in the lower extremity.  - No cystic structure found in the popliteal fossa.  *See table(s) above for measurements and observations. Electronically signed by Penne Colorado MD on 11/08/2024 at 10:41:17 AM.    Final       LOS: 5 days     Elgin Lam, MD [facility] 11/09/2024, 9:15 AM   If 7PM-7AM, please contact night-coverage [email or web address]  "

## 2024-11-10 ENCOUNTER — Encounter (HOSPITAL_COMMUNITY)
Admission: EM | Disposition: A | Discharge status: Hospice | Payer: Self-pay | Source: Home / Self Care | Attending: Internal Medicine

## 2024-11-10 ENCOUNTER — Encounter (HOSPITAL_COMMUNITY): Payer: Self-pay | Admitting: Vascular Surgery

## 2024-11-10 DIAGNOSIS — L97519 Non-pressure chronic ulcer of other part of right foot with unspecified severity: Secondary | ICD-10-CM

## 2024-11-10 DIAGNOSIS — I70235 Atherosclerosis of native arteries of right leg with ulceration of other part of foot: Secondary | ICD-10-CM

## 2024-11-10 DIAGNOSIS — Z9889 Other specified postprocedural states: Secondary | ICD-10-CM

## 2024-11-10 HISTORY — PX: PERIPHERAL VASCULAR ATHERECTOMY: CATH118256

## 2024-11-10 HISTORY — PX: ABDOMINAL AORTOGRAM W/LOWER EXTREMITY: CATH118223

## 2024-11-10 HISTORY — PX: LOWER EXTREMITY ANGIOGRAPHY: CATH118251

## 2024-11-10 LAB — GLUCOSE, CAPILLARY
Glucose-Capillary: 89 mg/dL (ref 70–99)
Glucose-Capillary: 68 mg/dL — ABNORMAL LOW (ref 70–99)
Glucose-Capillary: 74 mg/dL (ref 70–99)
Glucose-Capillary: 74 mg/dL (ref 70–99)
Glucose-Capillary: 194 mg/dL — ABNORMAL HIGH (ref 70–99)

## 2024-11-10 LAB — CBC
HCT: 32.8 % — ABNORMAL LOW (ref 39.0–52.0)
Hemoglobin: 11.4 g/dL — ABNORMAL LOW (ref 13.0–17.0)
MCH: 27.7 pg (ref 26.0–34.0)
MCHC: 34.8 g/dL (ref 30.0–36.0)
MCV: 79.6 fL — ABNORMAL LOW (ref 80.0–100.0)
Platelets: 658 K/uL — ABNORMAL HIGH (ref 150–400)
RBC: 4.12 MIL/uL — ABNORMAL LOW (ref 4.22–5.81)
RDW: 15 % (ref 11.5–15.5)
WBC: 14.5 K/uL — ABNORMAL HIGH (ref 4.0–10.5)
nRBC: 0 % (ref 0.0–0.2)

## 2024-11-10 LAB — BASIC METABOLIC PANEL WITH GFR
Anion gap: 12 (ref 5–15)
BUN: 17 mg/dL (ref 8–23)
CO2: 22 mmol/L (ref 22–32)
Calcium: 8.8 mg/dL — ABNORMAL LOW (ref 8.9–10.3)
Chloride: 100 mmol/L (ref 98–111)
Creatinine, Ser: 0.98 mg/dL (ref 0.61–1.24)
GFR, Estimated: >60 mL/min
Glucose, Bld: 82 mg/dL (ref 70–99)
Potassium: 4.4 mmol/L (ref 3.5–5.1)
Sodium: 134 mmol/L — ABNORMAL LOW (ref 135–145)

## 2024-11-10 LAB — CULTURE, BLOOD (ROUTINE X 2)
Culture: NO GROWTH
Special Requests: ADEQUATE

## 2024-11-10 MED ORDER — CLOPIDOGREL BISULFATE 75 MG PO TABS
ORAL_TABLET | ORAL | Status: AC
  Filled 2024-11-10: qty 1

## 2024-11-10 MED ORDER — SODIUM CHLORIDE 0.9% FLUSH: 3.0000 mL | INTRAVENOUS | Status: DC | PRN

## 2024-11-10 MED ORDER — CLOPIDOGREL BISULFATE 75 MG PO TABS: 75.0000 mg | ORAL_TABLET | Freq: Every day | ORAL | Status: DC

## 2024-11-10 MED ORDER — CLOPIDOGREL BISULFATE 300 MG PO TABS
ORAL_TABLET | ORAL | Status: DC | PRN
  Administered 2024-11-10: 75 mg via ORAL

## 2024-11-10 MED ORDER — SODIUM CHLORIDE 0.9% FLUSH
3.0000 mL | Freq: Two times a day (BID) | INTRAVENOUS | Status: DC
  Administered 2024-11-10 – 2024-11-19 (×13): 3 mL via INTRAVENOUS

## 2024-11-10 MED ORDER — HEPARIN SODIUM (PORCINE) 1000 UNIT/ML IJ SOLN
INTRAMUSCULAR | Status: AC
  Filled 2024-11-10: qty 10

## 2024-11-10 MED ORDER — LIDOCAINE HCL (PF) 1 % IJ SOLN
INTRAMUSCULAR | Status: AC
  Filled 2024-11-10: qty 30

## 2024-11-10 MED ORDER — SODIUM CHLORIDE 0.9 % IV SOLN: 250.0000 mL | INTRAVENOUS | Status: AC | PRN

## 2024-11-10 MED ORDER — HEPARIN SODIUM (PORCINE) 1000 UNIT/ML IJ SOLN
INTRAMUSCULAR | Status: DC | PRN
  Administered 2024-11-10: 6000 [IU] via INTRAVENOUS

## 2024-11-10 MED ORDER — SODIUM CHLORIDE 0.9 % IV SOLN: INTRAVENOUS | Status: AC

## 2024-11-10 MED ORDER — LIDOCAINE HCL (PF) 1 % IJ SOLN
INTRAMUSCULAR | Status: DC | PRN
  Administered 2024-11-10: 10 mL via INTRADERMAL

## 2024-11-10 MED ORDER — IODIXANOL 320 MG/ML IV SOLN
INTRAVENOUS | Status: DC | PRN
  Administered 2024-11-10: 45 mL via INTRA_ARTERIAL

## 2024-11-10 MED ORDER — ASPIRIN 81 MG PO CHEW
CHEWABLE_TABLET | ORAL | Status: AC
  Filled 2024-11-10: qty 1

## 2024-11-10 MED ORDER — ASPIRIN 81 MG PO CHEW
CHEWABLE_TABLET | ORAL | Status: DC | PRN
  Administered 2024-11-10: 81 mg via ORAL

## 2024-11-10 MED ORDER — ASPIRIN 325 MG PO TABS
325.0000 mg | ORAL_TABLET | Freq: Every day | ORAL | Status: DC
  Administered 2024-11-11 – 2024-11-13 (×2): 325 mg via ORAL
  Filled 2024-11-10 (×2): qty 1

## 2024-11-10 MED ORDER — HEPARIN (PORCINE) IN NACL 1000-0.9 UT/500ML-% IV SOLN
INTRAVENOUS | Status: DC | PRN
  Administered 2024-11-10: 1000 mL

## 2024-11-10 NOTE — Care Management Important Message (Signed)
 Important Message  Patient Details  Name: Patrick [name] MRN: [id number] Date of Birth: April 06, 1935   Important Message Given:  Yes - Medicare IM     Vonzell Arrie Sharps 11/10/2024, 10:28 AM

## 2024-11-10 NOTE — Progress Notes (Signed)
 Pt arrived from ...cath.., A/ox 4...pt denies any pain, MD aware,CCMD called. CHG bath given,no further needs at this time

## 2024-11-10 NOTE — Progress Notes (Signed)
" °  Progress Note    11/10/2024 9:41 AM 3 Days Post-Op  Subjective: No overnight issues  Vitals:   11/09/24 2347 11/10/24 0900  BP: 122/65 115/62  Pulse: 67 65  Resp: 16 18  Temp: 98.2 F (36.8 C) 98.9 F (37.2 C)  SpO2: 97% 100%    Physical Exam: Awake alert and oriented Nonlabored respirations Palpable left dorsalis pedis pulse Right groin without hematoma  CBC    Component Value Date/Time   WBC 14.5 (H) 11/10/2024 0342   RBC 4.12 (L) 11/10/2024 0342   HGB 11.4 (L) 11/10/2024 0342   HCT 32.8 (L) 11/10/2024 0342   PLT 658 (H) 11/10/2024 0342   MCV 79.6 (L) 11/10/2024 0342   MCH 27.7 11/10/2024 0342   MCHC 34.8 11/10/2024 0342   RDW 15.0 11/10/2024 0342   LYMPHSABS 4.5 (H) 11/04/2024 1207   MONOABS 1.1 (H) 11/04/2024 1207   EOSABS 0.1 11/04/2024 1207   BASOSABS 0.1 11/04/2024 1207    BMET    Component Value Date/Time   NA 134 (L) 11/10/2024 0342   K 4.4 11/10/2024 0342   CL 100 11/10/2024 0342   CO2 22 11/10/2024 0342   GLUCOSE 82 11/10/2024 0342   BUN 17 11/10/2024 0342   CREATININE 0.98 11/10/2024 0342   CALCIUM  8.8 (L) 11/10/2024 0342   GFRNONAA >60 11/10/2024 0342    INR No results found for: INR   Intake/Output Summary (Last 24 hours) at 11/10/2024 0941 Last data filed at 11/10/2024 0750 Gross per 24 hour  Intake 400 ml  Output 350 ml  Net 50 ml     Assessment/plan:  89 y.o. male is s/p left lower extremity revascularization where he has heel ulceration with plans for angiography of the right lower extremity from a left common femoral approach today.  I discussed with the patient's son Cornelious) and he agrees to proceed after discussing the risk benefits alternatives as well as need for future surgical intervention for foot ulceration.    Stepheny Canal C. Sheree, MD [facility] [place] Office: [phone] Pager: [phone]  11/10/2024 9:41 AM  "

## 2024-11-10 NOTE — Op Note (Signed)
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 Sheree, MD Procedure Performed: 1.  Percutaneous ultrasound-guided cannulation left common femoral artery with Mynx device closure 2.  Catheter selection of aorta and aortogram and right lower extremity angiography 3.  Laser athrectomy right anterior tibial artery with 1.9 mm [place] and plain balloon angioplasty with 3 mm coyote  Indications: 89 year old male with bilateral lower extremity ulceration on the left side he has osteomyelitis of the posterior inferior calcaneus and on the right side he has lateral midfoot fifth metatarsal base osteomyelitis.  He has now undergone angiography with treatment of the left anterior tibial artery and has a palpable pulse and is indicated for right lower extremity angiography.  Findings: The aorta and bilateral common iliac external iliac arteries were patent.  The right common femoral artery and SFA were patent to the popliteal artery.  At the tibial trifurcation the anterior tibial artery is heavily diseased for approximately 2.5 cm with 1 area of subtotal occlusion and reconstitutes with brisk flow to the foot.  The tibioperoneal trunk is initially patent peroneal artery is heavily diseased and then frankly occludes in the posterior tibial artery is initially not identified but reconstitutes diminutive vessel at the ankle.  After treatment of the anterior tibial artery there is brisk flow with no residual stenosis or dissection and there is a palpable dorsalis pedis pulse at the foot.  Patient is optimized from a vascular standpoint.   Procedure:  The patient was identified in the holding area and taken to room 8.  The patient was then placed supine on the table and prepped and draped in the usual sterile fashion.  A time out  was called.  Ultrasound was used to evaluate the left common femoral artery which was noted to be large and patent.  The area was anesthetized 1% lidocaine  and cannulated with a micropuncture needle followed by wire and a sheath.  An ultrasound image was saved to the permanent record.  We placed a Bentson wire followed by a 5 French sheath and an Omni catheter was placed at the level of L1 and aortogram was performed followed by crossing the bifurcation and performing right lower extremity angiography.  We then placed the Glidewire advantage followed by a quick cross catheter to the level of the knee and performed additional angiographic views.  A long 6 French sheath was then placed and the patient was fully heparinized.  Using V18 wire and CXI catheter we crossed the stenosis and subtotally occluded proximal anterior tibial artery and confirmed intraluminal access.  We then began with laser atherectomy using a 1.9 mm laser and then primarily performed balloon angioplasty at nominal pressure for 2 minutes with a 3 mm coyote.  Completion demonstrated no residual stenosis or dissection with brisk flow via the anterior tibial artery and ultimately he had a palpable dorsalis pedis pulse in the foot.  We then exchanged for a short 6 French sheath in the left groin and deployed a minx device.  He tolerated the procedure without Ameeth complication.   Contrast: 45cc  Jestine Bicknell C. Sheree, MD [facility] [place] Office: [phone] Pager: [phone]   "

## 2024-11-10 NOTE — Progress Notes (Signed)
 "  PROGRESS NOTE    Patrick [name]  FMW:[id number] DOB: October 17, 1935 DOA: 11/03/2024 PCP: Maree Leni Edyth DELENA, MD   Brief Narrative: Patrick [name] is a 89 y.o. male with a history of dementia, diabetes mellitus type 2, BPH/urinary retention s/p suprapubic catheter, pancreatic mass.  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 Vascular surgery and podiatry consulted. Empiric IV antibiotics started. Patient underwent angiogram with left dorsalis pedis and left anterior tibial angioplasty.   Assessment and Plan:  Sepsis Present on admission. Secondary to foot infection. Blood cultures obtained and patient started empirically on antibiotics. - Follow blood cultures - Continue antibiotics  Left foot abscess/osteomyelitis/myositis Noted on MRI. Podiatry and vascular surgery consulted on admission. Empiric Linezolid , Ceftriaxone  and Unasyn  started with transition to linezolid , cefepime  and metronidazole . Podiatry considering left below knee amputation. - Continue linezolid , cefepime  and metronidazole  - Follow-up podiatry recommendations: pending today  Right foot fifth metatarsal osteomyelitis Noted on MRI. Podiatry with plan for possible 5th ray resection I&D. - Follow-up podiatry recommendations: pending today - Continue antibiotics  Possible critical limb ischemia of bilateral lower extremity Vascular surgery consulted for management. ABIs performed on 1/1 and are significant for moderate bilateral disease. Patient underwent angiogram on 1/2, with vascular performing left dorsalis pedis and left anterior tibial angioplasty. Patient started on aspirin  and Plavix . - Vascular surgery recommendations: Right LE angiogram on 1/5  Diabetes mellitus type 2 Well controlled based on hemoglobin A1C of 6.5%. - Continue SSI  BPH Urinary retention Patient is s/p history  of suprapubic catheter placement. Patient follows with urology as an outpatient.  Dementia Appears to be mild, however patient developed some evidence of delirium on 1/3. Patient is already on Risperdal . Patient with hyperactive and hypoactive delirium. CT head unremarkable for acute process. - Delirium precautions - Continue Risperdal    DVT prophylaxis: Lovenox  Code Status:   Code Status: Full Code Family Communication: None at bedside. Disposition Plan: Discharge pending ongoing specialist recommendations and transition to outpatient antibiotic regimen as required   Consultants:  Vascular surgery Podiatry  Procedures:  LLE Angiogram  Antimicrobials: Linezolid  Ceftriaxone  Cefepime  Unasyn  Flagyl     Subjective: Patient reports no specific issues from overnight. Did not realize he was confused. Still concerned about possible amputation and wants to seriously consider the decision.  Objective: BP 115/62 (BP Location: Left Arm)   Pulse 65   Temp 98.9 F (37.2 C) (Oral)   Resp 18   Ht 5' 8 (1.727 m)   Wt 81.6 kg   SpO2 100%   BMI 27.35 kg/m   Examination:  General exam: Appears calm and comfortable. Respiratory system: Clear to auscultation. Respiratory effort normal. Cardiovascular system: S1 & S2 heard, RRR.  Gastrointestinal system: Abdomen is nondistended, soft and nontender. Normal bowel sounds heard. Central nervous system: Alert and oriented. No focal neurological deficits. Musculoskeletal: No edema. No calf tenderness   Data Reviewed: I have personally reviewed following labs and imaging studies  CBC Lab Results  Component Value Date   WBC 14.5 (H) 11/10/2024   RBC 4.12 (L) 11/10/2024   HGB 11.4 (L) 11/10/2024   HCT 32.8 (L) 11/10/2024   MCV 79.6 (L) 11/10/2024   MCH 27.7 11/10/2024   PLT 658 (H) 11/10/2024   MCHC 34.8 11/10/2024   RDW 15.0 11/10/2024   LYMPHSABS 4.5 (H) 11/04/2024   MONOABS 1.1 (H) 11/04/2024   EOSABS 0.1 11/04/2024    BASOSABS  0.1 11/04/2024     Last metabolic panel Lab Results  Component Value Date   NA 134 (L) 11/10/2024   K 4.4 11/10/2024   CL 100 11/10/2024   CO2 22 11/10/2024   BUN 17 11/10/2024   CREATININE 0.98 11/10/2024   GLUCOSE 82 11/10/2024   GFRNONAA >60 11/10/2024   CALCIUM  8.8 (L) 11/10/2024   PHOS 2.6 11/04/2024   PROT 7.8 11/09/2024   ALBUMIN 3.5 11/09/2024   BILITOT 0.3 11/09/2024   ALKPHOS 70 11/09/2024   AST 34 11/09/2024   ALT 12 11/09/2024   ANIONGAP 12 11/10/2024    GFR: Estimated Creatinine Clearance: 51.4 mL/min (by C-G formula based on SCr of 0.98 mg/dL).  Recent Results (from the past 240 hours)  Blood culture (routine x 2)     Status: None   Collection Time: 11/04/24  8:30 PM   Specimen: BLOOD LEFT ARM  Result Value Ref Range Status   Specimen Description   Final    BLOOD LEFT ARM Performed at [facility], [address], [place], [place] [address]    Special Requests   Final    BOTTLES DRAWN AEROBIC AND ANAEROBIC Blood Culture adequate volume Performed at [facility], [address] [place], [place] [address]    Culture   Final    NO GROWTH 5 DAYS Performed at [facility], [address], [place], [place] [address]    Report Status 11/10/2024 FINAL  Final      Radiology Studies: CT HEAD WO CONTRAST ( ) Result Date: 11/09/2024 EXAM: CT HEAD WITHOUT 11/09/2024 07:58:34 PM TECHNIQUE: CT of the head was performed without the administration of intravenous contrast. Automated exposure control, iterative reconstruction, and/or weight based adjustment of the mA/kV was utilized to reduce the radiation dose to as low as reasonably achievable. COMPARISON: None available. CLINICAL HISTORY: Mental status change, unknown cause FINDINGS: BRAIN AND VENTRICLES: No acute intracranial hemorrhage. No mass effect or midline shift. No extra-axial fluid collection. No evidence of acute infarct. No hydrocephalus. Moderate  patchy and confluent white matter hypoattenuation, compatible with chronic microvascular ischemic change. ORBITS: No acute abnormality. SINUSES AND MASTOIDS: No acute abnormality. SOFT TISSUES AND SKULL: No acute skull fracture. No acute soft tissue abnormality. IMPRESSION: 1. No acute intracranial abnormality. 2. Moderate chronic microvascular ischemic disease. Electronically signed by: Gilmore Molt 11/09/2024 10:04 PM EST RP Workstation: [id number]      LOS: 6 days    Elgin Lam, MD [facility] 11/10/2024, 11:07 AM   If 7PM-7AM, please contact night-coverage [email or web address]  "

## 2024-11-10 NOTE — Progress Notes (Signed)
 "  PODIATRY PROGRESS NOTE Patient Name: Patrick [name]  DOB Nov 01, 1935 DOA 11/03/2024  Hospital Day: 8  Assessment:  89 y.o. male with PMHx significant for DM2 with neuropathy and PVD suspected with infected ulceration right lateral midfoot at the fifth metatarsal base with underlying osteomyelitis as well as necrotic eschar left heel with underlying abscess and calcaneal osteomyelitis as well as critical limb ischemia bilateral lower extremity   WBC  14.5 ESR  62, CRP 22.3 MRI Left Heel: abscess right medial heel, osteomyelitis posterior inferior calcaneus MRI R foot: Ulcer R lateral midfoot with underlying osteomyelitis lateral 5th met base  Plan:  - Continue to recommend LLE proximal limb amputation BKA/AKA due to extent of osseous infeciton in calc. Vascular is following appreciate recs.  - Going for angio RLE today  - On the right side patient will need R foot wound debridement, partial 5th met resection of the base, posssible abx beads/ wound vac - timing likely Wednesday, NPO mn prior, will see if can be coordinated with the amputation.    - Continue IV abx broad spectrum pending further culture data - Anticoagulation: Defer to vascular recommendation - Wound care: Recommend Betadine dsg right foot - WB status: Weightbearing as tolerated - Will continue to follow        Marolyn JULIANNA Honour, DPM [facility]    Subjective:  Pt seen briefly before being taken down to cath lab for angiogram RLE. Discussed I will follow up with his son via phone and him tmrw to discuss surgical plan but likely planning for right foot surgery on Wednesday.   Objective:   Vitals:   11/09/24 2347 11/10/24 0900  BP: 122/65 115/62  Pulse: 67 65  Resp: 16 18  Temp: 98.2 F (36.8 C) 98.9 F (37.2 C)  SpO2: 97% 100%       Latest Ref Rng & Units 11/10/2024    3:42 AM 11/09/2024    3:58 PM 11/08/2024    3:33 AM  CBC  WBC 4.0 - 10.5 K/uL 14.5  16.4  16.4   Hemoglobin 13.0 - 17.0  g/dL 88.5  88.3  89.0   Hematocrit 39.0 - 52.0 % 32.8  34.1  31.7   Platelets 150 - 400 K/uL 658  575  668        Latest Ref Rng & Units 11/10/2024    3:42 AM 11/09/2024    3:58 PM 11/06/2024    7:58 AM  BMP  Glucose 70 - 99 mg/dL 82  93  896   BUN 8 - 23 mg/dL 17  16  12    Creatinine 0.61 - 1.24 mg/dL 9.01  8.94  8.94   Sodium 135 - 145 mmol/L 134  131  135   Potassium 3.5 - 5.1 mmol/L 4.4  4.6  4.4   Chloride 98 - 111 mmol/L 100  98  100   CO2 22 - 32 mmol/L 22  23  20    Calcium  8.9 - 10.3 mg/dL 8.8  9.0  8.9     General: AAOx3, NAD  Lower Extremity Exam  LEFT: Decreased erytehma edema from prior of foot and leg though still moderated  DP 2+, non palpable PT  Large necrotic oval area of the left medial heel with pain on palpation with darkening of the skin,    RIGHT:  Lateral 5th met base ulceration with fibrotic tissue present Malodor present Non palpable DP/PT      Radiology:  Results reviewed. See  assessment for pertinent imaging results  "

## 2024-11-11 ENCOUNTER — Encounter (HOSPITAL_COMMUNITY): Payer: Self-pay | Admitting: Vascular Surgery

## 2024-11-11 DIAGNOSIS — I70201 Unspecified atherosclerosis of native arteries of extremities, right leg: Secondary | ICD-10-CM

## 2024-11-11 DIAGNOSIS — M86271 Subacute osteomyelitis, right ankle and foot: Secondary | ICD-10-CM

## 2024-11-11 DIAGNOSIS — Z9889 Other specified postprocedural states: Secondary | ICD-10-CM

## 2024-11-11 LAB — GLUCOSE, CAPILLARY
Glucose-Capillary: 112 mg/dL — ABNORMAL HIGH (ref 70–99)
Glucose-Capillary: 134 mg/dL — ABNORMAL HIGH (ref 70–99)
Glucose-Capillary: 137 mg/dL — ABNORMAL HIGH (ref 70–99)
Glucose-Capillary: 193 mg/dL — ABNORMAL HIGH (ref 70–99)

## 2024-11-11 MED ORDER — IPRATROPIUM-ALBUTEROL 0.5-2.5 (3) MG/3ML IN SOLN: 3.0000 mL | RESPIRATORY_TRACT | Status: DC | PRN

## 2024-11-11 MED ORDER — ORAL CARE MOUTH RINSE: 15.0000 mL | OROMUCOSAL | Status: DC | PRN

## 2024-11-11 MED ORDER — METRONIDAZOLE 500 MG PO TABS
500.0000 mg | ORAL_TABLET | Freq: Two times a day (BID) | ORAL | Status: DC
  Administered 2024-11-11 – 2024-11-14 (×5): 500 mg via ORAL
  Filled 2024-11-11 (×5): qty 1

## 2024-11-11 NOTE — Progress Notes (Signed)
" ° °  PODIATRY PROGRESS NOTE Patient Name: Patrick [name]  DOB 1935-03-15 DOA 11/03/2024  Hospital Day: 28  Assessment:  89 y.o. male with PMHx significant for DM2 with neuropathy and PVD suspected with infected ulceration right lateral midfoot at the fifth metatarsal base with underlying osteomyelitis as well as necrotic eschar left heel with underlying abscess and calcaneal osteomyelitis as well as critical limb ischemia bilateral lower extremity   WBC  14.5 ESR  62, CRP 22.3 MRI Left Heel: abscess right medial heel, osteomyelitis posterior inferior calcaneus MRI R foot: Ulcer R lateral midfoot with underlying osteomyelitis lateral 5th met base  Plan:  -NPO p MN tonight for OR tomorrow for right side patient will need RIGHT foot wound debridement, partial 5th met resection, posssible abx beads/ wound vac - Appreciate vascular surgery - s/p BLE angiogram, planning for LLE amputation underway  - Continue IV abx broad spectrum pending further culture data - Anticoagulation: Defer to vascular recommendation - Wound care: Recommend Betadine dsg right foot - WB status: Weightbearing as tolerated - Will continue to follow        Marolyn JULIANNA Honour, DPM [facility]    Subjective:  Pt seen  in room discussed plans for my surgical intervention on the RIGHT foot tomorrow. Pt states understanding I am not working on the left side and that I am recommending amputation there which will be per vascular surgery. All questions answered.   Objective:   Vitals:   11/11/24 1058 11/11/24 1100  BP: 127/72 127/72  Pulse: 68 69  Resp: 17   Temp: 98.4 F (36.9 C)   SpO2: 94% 99%       Latest Ref Rng & Units 11/10/2024    3:42 AM 11/09/2024    3:58 PM 11/08/2024    3:33 AM  CBC  WBC 4.0 - 10.5 K/uL 14.5  16.4  16.4   Hemoglobin 13.0 - 17.0 g/dL 88.5  88.3  89.0   Hematocrit 39.0 - 52.0 % 32.8  34.1  31.7   Platelets 150 - 400 K/uL 658  575  668        Latest Ref Rng & Units  11/10/2024    3:42 AM 11/09/2024    3:58 PM 11/06/2024    7:58 AM  BMP  Glucose 70 - 99 mg/dL 82  93  896   BUN 8 - 23 mg/dL 17  16  12    Creatinine 0.61 - 1.24 mg/dL 9.01  8.94  8.94   Sodium 135 - 145 mmol/L 134  131  135   Potassium 3.5 - 5.1 mmol/L 4.4  4.6  4.4   Chloride 98 - 111 mmol/L 100  98  100   CO2 22 - 32 mmol/L 22  23  20    Calcium  8.9 - 10.3 mg/dL 8.8  9.0  8.9     General: AAOx3, NAD  Lower Extremity Exam  LEFT: Decreased erytehma edema from prior of foot and leg though still moderated  DP 2+, non palpable PT  Large necrotic oval area of the left medial heel with pain on palpation with darkening of the skin,    RIGHT:  Lateral 5th met base ulceration with fibrotic tissue present Malodor present    Radiology:  Results reviewed. See assessment for pertinent imaging results  "

## 2024-11-11 NOTE — Progress Notes (Signed)
 "  PROGRESS NOTE    Patrick [name]  FMW:[id number] DOB: 27-Sep-1935 DOA: 11/03/2024 PCP: Maree Leni Edyth DELENA, MD   Brief Narrative: Patrick [name] is a 89 y.o. male with a history of dementia, diabetes mellitus type 2, BPH/urinary retention s/p suprapubic catheter, pancreatic mass.  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 Vascular surgery and podiatry consulted. Empiric IV antibiotics started. Patient underwent angiogram with left dorsalis pedis and left anterior tibial angioplasty.   Assessment and Plan:  Sepsis Present on admission. Secondary to foot infection. Blood cultures obtained and patient started empirically on antibiotics. Blood cultures no growth x5 days. - Continue antibiotics  Left foot abscess/osteomyelitis/myositis Noted on MRI. Podiatry and vascular surgery consulted on admission. Empiric Linezolid , Ceftriaxone  and Unasyn  started with transition to linezolid , cefepime  and metronidazole . Podiatry recommending leg amputation (AKA vs BKA). Vascular surgery aware and will work on timing. - Continue linezolid , cefepime  and metronidazole  until surgery - Vascular surgery recommendations: plan for amputation  Right foot fifth metatarsal osteomyelitis Noted on MRI. Podiatry with plan for possible 5th ray resection I&D. - Follow-up podiatry recommendations: trying to coordinate with vascular surgery for timing - Continue antibiotics  Possible critical limb ischemia of bilateral lower extremity Vascular surgery consulted for management. ABIs performed on 1/1 and are significant for moderate bilateral disease. Patient underwent angiogram on 1/2, with vascular performing left dorsalis pedis and left anterior tibial angioplasty. Patient started on aspirin  and Plavix . Right lower extremity angiogram performed on 1/5 with laser arthrectomy of right anterior  tibial artery and balloon angioplasty.  Diabetes mellitus type 2 Well controlled based on hemoglobin A1C of 6.5%. - Continue SSI  BPH Urinary retention Patient is s/p history of suprapubic catheter placement. Patient follows with urology as an outpatient.  Dementia Appears to be mild, however patient developed some evidence of delirium on 1/3. Patient is already on Risperdal . Patient with hyperactive and hypoactive delirium. CT head unremarkable for acute process. Unclear if episodes are related to risperidone . - Delirium precautions - Discontinue Risperdal    DVT prophylaxis: Lovenox  Code Status:   Code Status: Full Code Family Communication: None at bedside. Disposition Plan: Discharge pending ongoing specialist recommendations and transition to outpatient antibiotic regimen as required   Consultants:  Vascular surgery Podiatry  Procedures:  LLE Angiogram  Antimicrobials: Linezolid  Ceftriaxone  Cefepime  Unasyn  Flagyl     Subjective: Patient is unhappy that he hasn't seen his son. Per nursing, patient was more confused this morning.  Objective: BP 132/72 (BP Location: Left Arm)   Pulse 67   Temp 98.1 F (36.7 C)   Resp 19   Ht 5' 8 (1.727 m)   Wt 81.6 kg   SpO2 94%   BMI 27.35 kg/m   Examination:  General exam: Appears calm and comfortable. Respiratory system: Clear to auscultation. Respiratory effort normal. Cardiovascular system: S1 & S2 heard, RRR.  Gastrointestinal system: Abdomen is nondistended, soft and nontender. Normal bowel sounds heard. Central nervous system: Alert and oriented to person and place. No focal neurological deficits. Musculoskeletal: No edema. No calf tenderness   Data Reviewed: I have personally reviewed following labs and imaging studies  CBC Lab Results  Component Value Date   WBC 14.5 (H) 11/10/2024   RBC 4.12 (L) 11/10/2024   HGB 11.4 (L) 11/10/2024   HCT 32.8 (L) 11/10/2024   MCV 79.6 (L) 11/10/2024   MCH 27.7  11/10/2024   PLT 658 (H)  11/10/2024   MCHC 34.8 11/10/2024   RDW 15.0 11/10/2024   LYMPHSABS 4.5 (H) 11/04/2024   MONOABS 1.1 (H) 11/04/2024   EOSABS 0.1 11/04/2024   BASOSABS 0.1 11/04/2024     Last metabolic panel Lab Results  Component Value Date   NA 134 (L) 11/10/2024   K 4.4 11/10/2024   CL 100 11/10/2024   CO2 22 11/10/2024   BUN 17 11/10/2024   CREATININE 0.98 11/10/2024   GLUCOSE 82 11/10/2024   GFRNONAA >60 11/10/2024   CALCIUM  8.8 (L) 11/10/2024   PHOS 2.6 11/04/2024   PROT 7.8 11/09/2024   ALBUMIN 3.5 11/09/2024   BILITOT 0.3 11/09/2024   ALKPHOS 70 11/09/2024   AST 34 11/09/2024   ALT 12 11/09/2024   ANIONGAP 12 11/10/2024    GFR: Estimated Creatinine Clearance: 51.4 mL/min (by C-G formula based on SCr of 0.98 mg/dL).  Recent Results (from the past 240 hours)  Blood culture (routine x 2)     Status: None   Collection Time: 11/04/24  8:30 PM   Specimen: BLOOD LEFT ARM  Result Value Ref Range Status   Specimen Description   Final    BLOOD LEFT ARM Performed at [facility], [address] [place], [place] [address]    Special Requests   Final    BOTTLES DRAWN AEROBIC AND ANAEROBIC Blood Culture adequate volume Performed at [facility], [address], [place], [place] [address]    Culture   Final    NO GROWTH 5 DAYS Performed at [facility], [address], [place], [place] [address]    Report Status 11/10/2024 FINAL  Final      Radiology Studies: PERIPHERAL VASCULAR CATHETERIZATION Result Date: 11/10/2024 Images from the original result were not included. 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 Sheree, MD Procedure Performed: 1.  Percutaneous ultrasound-guided cannulation left common femoral artery with Mynx device  closure 2.  Catheter selection of aorta and aortogram and right lower extremity angiography 3.  Laser athrectomy right anterior tibial artery with 1.9 mm [place] and plain balloon angioplasty with 3 mm coyote Indications: 89 year old male with bilateral lower extremity ulceration on the left side he has osteomyelitis of the posterior inferior calcaneus and on the right side he has lateral midfoot fifth metatarsal base osteomyelitis.  He has now undergone angiography with treatment of the left anterior tibial artery and has a palpable pulse and is indicated for right lower extremity angiography. Findings: The aorta and bilateral common iliac external iliac arteries were patent.  The right common femoral artery and SFA were patent to the popliteal artery.  At the tibial trifurcation the anterior tibial artery is heavily diseased for approximately 2.5 cm with 1 area of subtotal occlusion and reconstitutes with brisk flow to the foot.  The tibioperoneal trunk is initially patent peroneal artery is heavily diseased and then frankly occludes in the posterior tibial artery is initially not identified but reconstitutes diminutive vessel at the ankle.  After treatment of the anterior tibial artery there is brisk flow with no residual stenosis or dissection and there is a palpable dorsalis pedis pulse at the foot. Patient is optimized from a vascular standpoint.  Procedure:  The patient was identified in the holding area and taken to room 8.  The patient was then placed supine on the table and prepped  and draped in the usual sterile fashion.  A time out was called.  Ultrasound was used to evaluate the left common femoral artery which was noted to be large and patent.  The area was anesthetized 1% lidocaine  and cannulated with a micropuncture needle followed by wire and a sheath.  An ultrasound image was saved to the permanent record.  We placed a Bentson wire followed by a 5 French sheath and an Omni catheter was placed at the  level of L1 and aortogram was performed followed by crossing the bifurcation and performing right lower extremity angiography.  We then placed the Glidewire advantage followed by a quick cross catheter to the level of the knee and performed additional angiographic views.  A long 6 French sheath was then placed and the patient was fully heparinized.  Using V18 wire and CXI catheter we crossed the stenosis and subtotally occluded proximal anterior tibial artery and confirmed intraluminal access.  We then began with laser atherectomy using a 1.9 mm laser and then primarily performed balloon angioplasty at nominal pressure for 2 minutes with a 3 mm coyote.  Completion demonstrated no residual stenosis or dissection with brisk flow via the anterior tibial artery and ultimately he had a palpable dorsalis pedis pulse in the foot.  We then exchanged for a short 6 French sheath in the left groin and deployed a minx device.  He tolerated the procedure without Ameeth complication. Contrast: 45cc Brandon C. Sheree, MD [facility] Office: [phone] Pager: [phone]   CT HEAD WO CONTRAST ( ) Result Date: 11/09/2024 EXAM: CT HEAD WITHOUT 11/09/2024 07:58:34 PM TECHNIQUE: CT of the head was performed without the administration of intravenous contrast. Automated exposure control, iterative reconstruction, and/or weight based adjustment of the mA/kV was utilized to reduce the radiation dose to as low as reasonably achievable. COMPARISON: None available. CLINICAL HISTORY: Mental status change, unknown cause FINDINGS: BRAIN AND VENTRICLES: No acute intracranial hemorrhage. No mass effect or midline shift. No extra-axial fluid collection. No evidence of acute infarct. No hydrocephalus. Moderate patchy and confluent white matter hypoattenuation, compatible with chronic microvascular ischemic change. ORBITS: No acute abnormality. SINUSES AND MASTOIDS: No acute abnormality. SOFT TISSUES AND SKULL: No  acute skull fracture. No acute soft tissue abnormality. IMPRESSION: 1. No acute intracranial abnormality. 2. Moderate chronic microvascular ischemic disease. Electronically signed by: Gilmore Molt 11/09/2024 10:04 PM EST RP Workstation: [id number]      LOS: 7 days    Elgin Lam, MD [facility] 11/11/2024, 12:43 PM   If 7PM-7AM, please contact night-coverage [email or web address]  "

## 2024-11-11 NOTE — Progress Notes (Addendum)
" °  Progress Note    11/11/2024 8:26 AM 1 Day Post-Op  Subjective:  no complaints   Vitals:   11/11/24 0357 11/11/24 0800  BP:  135/64  Pulse:  65  Resp:  18  Temp: 98 F (36.7 C) 98.6 F (37 C)  SpO2:  100%   Physical Exam Lungs:  non labored Incisions:  L groin without hematoma Extremities:  palpable DP pulses BLE Neurologic: A&O  CBC    Component Value Date/Time   WBC 14.5 (H) 11/10/2024 0342   RBC 4.12 (L) 11/10/2024 0342   HGB 11.4 (L) 11/10/2024 0342   HCT 32.8 (L) 11/10/2024 0342   PLT 658 (H) 11/10/2024 0342   MCV 79.6 (L) 11/10/2024 0342   MCH 27.7 11/10/2024 0342   MCHC 34.8 11/10/2024 0342   RDW 15.0 11/10/2024 0342   LYMPHSABS 4.5 (H) 11/04/2024 1207   MONOABS 1.1 (H) 11/04/2024 1207   EOSABS 0.1 11/04/2024 1207   BASOSABS 0.1 11/04/2024 1207    BMET    Component Value Date/Time   NA 134 (L) 11/10/2024 0342   K 4.4 11/10/2024 0342   CL 100 11/10/2024 0342   CO2 22 11/10/2024 0342   GLUCOSE 82 11/10/2024 0342   BUN 17 11/10/2024 0342   CREATININE 0.98 11/10/2024 0342   CALCIUM  8.8 (L) 11/10/2024 0342   GFRNONAA >60 11/10/2024 0342    INR No results found for: INR   Intake/Output Summary (Last 24 hours) at 11/11/2024 0826 Last data filed at 11/11/2024 0550 Gross per 24 hour  Intake 320 ml  Output 975 ml  Net -655 ml     Assessment/Plan:  89 y.o. male is s/p aortogram right lower extremity runoff with laser atherectomy of the right anterior tibial artery and balloon angioplasty 1 Day Post-Op   Successful revascularization of the right anterior tibial artery yesterday.  Patient now has a palpable right DP pulse.  Plans noted for right foot debridement, partial fifth metatarsal resection and VAC with Dr. Malvin tomorrow.  Left lower extremity MR demonstrates calcaneus osteomyelitis with possible abscess medially.  He continues to have an elevated white count.  Despite revascularization, he will likely require left leg amputation.  He is  aware of this and willing to proceed.  Will discuss timing with Dr. Sheree.     Donnice Sender, PA-C [facility] [phone] 11/11/2024 8:26 AM  VASCULAR STAFF ADDENDUM: I have independently interviewed and examined the patient. I agree with the above.   Debby SAILOR. Magda, MD [facility] Phone Number: [phone] 11/11/2024 3:03 PM      "

## 2024-11-12 ENCOUNTER — Inpatient Hospital Stay (HOSPITAL_COMMUNITY): Admitting: Registered Nurse

## 2024-11-12 ENCOUNTER — Inpatient Hospital Stay (HOSPITAL_COMMUNITY)

## 2024-11-12 ENCOUNTER — Encounter (HOSPITAL_COMMUNITY)
Admission: EM | Disposition: A | Discharge status: Hospice | Payer: Self-pay | Source: Home / Self Care | Attending: Internal Medicine

## 2024-11-12 ENCOUNTER — Encounter (HOSPITAL_COMMUNITY): Admitting: Registered Nurse

## 2024-11-12 DIAGNOSIS — E8809 Other disorders of plasma-protein metabolism, not elsewhere classified: Secondary | ICD-10-CM | POA: Diagnosis not present

## 2024-11-12 DIAGNOSIS — E871 Hypo-osmolality and hyponatremia: Secondary | ICD-10-CM

## 2024-11-12 DIAGNOSIS — D649 Anemia, unspecified: Secondary | ICD-10-CM | POA: Diagnosis not present

## 2024-11-12 DIAGNOSIS — A419 Sepsis, unspecified organism: Secondary | ICD-10-CM | POA: Diagnosis not present

## 2024-11-12 DIAGNOSIS — M869 Osteomyelitis, unspecified: Secondary | ICD-10-CM

## 2024-11-12 DIAGNOSIS — M86271 Subacute osteomyelitis, right ankle and foot: Secondary | ICD-10-CM | POA: Diagnosis not present

## 2024-11-12 DIAGNOSIS — D75839 Thrombocytosis, unspecified: Secondary | ICD-10-CM

## 2024-11-12 DIAGNOSIS — M86171 Other acute osteomyelitis, right ankle and foot: Secondary | ICD-10-CM

## 2024-11-12 DIAGNOSIS — L0390 Cellulitis, unspecified: Secondary | ICD-10-CM | POA: Diagnosis not present

## 2024-11-12 DIAGNOSIS — F03918 Unspecified dementia, unspecified severity, with other behavioral disturbance: Secondary | ICD-10-CM | POA: Diagnosis not present

## 2024-11-12 HISTORY — PX: AMPUTATION: SHX166

## 2024-11-12 LAB — GLUCOSE, CAPILLARY
Glucose-Capillary: 160 mg/dL — ABNORMAL HIGH (ref 70–99)
Glucose-Capillary: 142 mg/dL — ABNORMAL HIGH (ref 70–99)
Glucose-Capillary: 99 mg/dL (ref 70–99)
Glucose-Capillary: 145 mg/dL — ABNORMAL HIGH (ref 70–99)

## 2024-11-12 LAB — BASIC METABOLIC PANEL WITH GFR
Anion gap: 9 (ref 5–15)
BUN: 26 mg/dL — ABNORMAL HIGH (ref 8–23)
CO2: 25 mmol/L (ref 22–32)
Calcium: 9.5 mg/dL (ref 8.9–10.3)
Chloride: 97 mmol/L — ABNORMAL LOW (ref 98–111)
Creatinine, Ser: 1.17 mg/dL (ref 0.61–1.24)
GFR, Estimated: >60 mL/min
Glucose, Bld: 172 mg/dL — ABNORMAL HIGH (ref 70–99)
Potassium: 4.6 mmol/L (ref 3.5–5.1)
Sodium: 132 mmol/L — ABNORMAL LOW (ref 135–145)

## 2024-11-12 LAB — CBC
HCT: 31.2 % — ABNORMAL LOW (ref 39.0–52.0)
Hemoglobin: 11 g/dL — ABNORMAL LOW (ref 13.0–17.0)
MCH: 27.4 pg (ref 26.0–34.0)
MCHC: 35.3 g/dL (ref 30.0–36.0)
MCV: 77.6 fL — ABNORMAL LOW (ref 80.0–100.0)
Platelets: 554 K/uL — ABNORMAL HIGH (ref 150–400)
RBC: 4.02 MIL/uL — ABNORMAL LOW (ref 4.22–5.81)
RDW: 15 % (ref 11.5–15.5)
WBC: 18.7 K/uL — ABNORMAL HIGH (ref 4.0–10.5)
nRBC: 0 % (ref 0.0–0.2)

## 2024-11-12 MED ORDER — TOBRAMYCIN SULFATE 80 MG/2ML IJ SOLN
INTRAMUSCULAR | Status: AC
  Filled 2024-11-12: qty 4

## 2024-11-12 MED ORDER — VANCOMYCIN HCL 500 MG IV SOLR
INTRAVENOUS | Status: DC | PRN
  Administered 2024-11-12: 1000 mg via TOPICAL

## 2024-11-12 MED ORDER — OXYCODONE HCL 5 MG/5ML PO SOLN: 5.0000 mg | Freq: Once | ORAL | Status: DC | PRN

## 2024-11-12 MED ORDER — TOBRAMYCIN SULFATE 80 MG/2ML IJ SOLN
INTRAMUSCULAR | Status: DC | PRN
  Administered 2024-11-12: 80 mg via INTRAMUSCULAR

## 2024-11-12 MED ORDER — LIDOCAINE HCL 1 % IJ SOLN
INTRAMUSCULAR | Status: DC | PRN
  Administered 2024-11-12: 10 mL via INTRAMUSCULAR

## 2024-11-12 MED ORDER — ORAL CARE MOUTH RINSE: 15.0000 mL | Freq: Once | OROMUCOSAL | Status: AC

## 2024-11-12 MED ORDER — FENTANYL CITRATE (PF) 100 MCG/2ML IJ SOLN: 25.0000 ug | INTRAMUSCULAR | Status: DC | PRN

## 2024-11-12 MED ORDER — OXYCODONE HCL 5 MG PO TABS
2.5000 mg | ORAL_TABLET | Freq: Four times a day (QID) | ORAL | Status: DC | PRN
  Administered 2024-11-13 – 2024-11-16 (×3): 5 mg via ORAL
  Filled 2024-11-12 (×3): qty 1

## 2024-11-12 MED ORDER — FENTANYL CITRATE (PF) 100 MCG/2ML IJ SOLN
INTRAMUSCULAR | Status: DC | PRN
  Administered 2024-11-12: 25 ug via INTRAVENOUS

## 2024-11-12 MED ORDER — PROPOFOL 10 MG/ML IV BOLUS
INTRAVENOUS | Status: DC | PRN
  Administered 2024-11-12: 30 mg via INTRAVENOUS
  Administered 2024-11-12: 75 ug/kg/min via INTRAVENOUS

## 2024-11-12 MED ORDER — OXYCODONE-ACETAMINOPHEN 5-325 MG PO TABS
1.0000 | ORAL_TABLET | ORAL | Status: DC | PRN
  Administered 2024-11-12 – 2024-11-17 (×7): 1 via ORAL
  Filled 2024-11-12 (×7): qty 1

## 2024-11-12 MED ORDER — LIDOCAINE HCL (PF) 1 % IJ SOLN
INTRAMUSCULAR | Status: AC
  Filled 2024-11-12: qty 30

## 2024-11-12 MED ORDER — OXYCODONE HCL 5 MG PO TABS: 5.0000 mg | ORAL_TABLET | Freq: Once | ORAL | Status: DC | PRN

## 2024-11-12 MED ORDER — VANCOMYCIN HCL 500 MG IV SOLR
INTRAVENOUS | Status: AC
  Filled 2024-11-12: qty 10

## 2024-11-12 MED ORDER — FENTANYL CITRATE (PF) 100 MCG/2ML IJ SOLN
INTRAMUSCULAR | Status: AC
  Filled 2024-11-12: qty 2

## 2024-11-12 MED ORDER — BUPIVACAINE HCL (PF) 0.5 % IJ SOLN
INTRAMUSCULAR | Status: AC
  Filled 2024-11-12: qty 30

## 2024-11-12 MED ORDER — PROPOFOL 10 MG/ML IV BOLUS
INTRAVENOUS | Status: AC
  Filled 2024-11-12: qty 20

## 2024-11-12 MED ORDER — PHENYLEPHRINE 80 MCG/ML (10ML) SYRINGE FOR IV PUSH (FOR BLOOD PRESSURE SUPPORT)
PREFILLED_SYRINGE | INTRAVENOUS | Status: DC | PRN
  Administered 2024-11-12: 80 ug via INTRAVENOUS

## 2024-11-12 MED ORDER — LACTATED RINGERS IV SOLN: INTRAVENOUS | Status: DC

## 2024-11-12 MED ORDER — SODIUM CHLORIDE 0.9 % IR SOLN
Status: DC | PRN
  Administered 2024-11-12: 1000 mL

## 2024-11-12 MED ORDER — INSULIN ASPART 100 UNIT/ML IJ SOLN: 0.0000 [IU] | INTRAMUSCULAR | Status: DC | PRN

## 2024-11-12 MED ORDER — CHLORHEXIDINE GLUCONATE 0.12 % MT SOLN
15.0000 mL | Freq: Once | OROMUCOSAL | Status: AC
  Administered 2024-11-12: 15 mL via OROMUCOSAL
  Filled 2024-11-12: qty 15

## 2024-11-12 MED ORDER — ONDANSETRON HCL 4 MG/2ML IJ SOLN: 4.0000 mg | Freq: Four times a day (QID) | INTRAMUSCULAR | Status: DC | PRN

## 2024-11-12 NOTE — Progress Notes (Signed)
 " PROGRESS NOTE    Patrick [name]  FMW:[id number] DOB: 11-10-1934 DOA: 11/03/2024 PCP: Maree Leni Edyth DELENA, MD   Brief Narrative:  Patrick [name] is a 89 y.o. male with a history of dementia, diabetes mellitus type 2, BPH/urinary retention s/p suprapubic catheter, pancreatic mass.  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 Vascular surgery and podiatry consulted. Empiric IV antibiotics started. Patient underwent angiogram with left dorsalis pedis and left anterior tibial angioplasty.  He is now status post I&D with fifth metatarsal base resection of the right foot.  Current plan is for left lower extremity BKA with timing to be determined by Vascular.  Assessment and Plan:  Sepsis: Present on admission. Secondary to foot infection and Osteomyelitis. Blood cultures obtained and patient started empirically on antibiotics. Blood cultures no growth x5 days. Continue Antibiotics as below   Left Foot Abscess/Osteomyelitis/Myositis: Noted on MRI. Podiatry and vascular surgery consulted on admission. Empiric Linezolid , Ceftriaxone  and Unasyn  started with transition to linezolid , cefepime  and metronidazole . Podiatry recommending leg amputation (AKA vs BKA). Vascular surgery aware and will work on timing of amputation of LLE. Continue linezolid , cefepime  and metronidazole  until surgery. Vascular Surgery to follow up 11/13/24 about the timing of BKA. WBC Trend:  Recent Labs  Lab 11/03/24 1940 11/04/24 1207 11/06/24 0758 11/08/24 0333 11/09/24 1558 11/10/24 0342 11/12/24 0518  WBC 20.2* 23.1* 23.1* 16.4* 16.4* 14.5* 18.7*    Right Foot Fifth Metatarsal Osteomyelitis: Noted on MRI. Podiatry with plan for 5th ray resection I&D and he is postoperative day 0 with antibiotic bead application of vancomycin  tobramycin  as well as application of negative pressure wound.  However negative  pressure wound did not work properly and started getting blockages and there is oozing noted from the surgical site.  The VAC was discontinued and he is now switched to saline wet-to-dry packing with wound and dry gauze 4 x 4 ABD pad Kerlix Ace wrapped with compression.  The podiatry team feels that the wound VAC is likely be reapplied later this week by the wound ostomy nurse in the room consulting for wound VAC change on Friday. Continue Antibiotics w/ po Linezolid  and po Metronidazole  as well as IV Cefepime .  -Aerobic/Anaerobic Cx pending    Possible Critical limb ischemia of bilateral lower extremity: Vascular surgery consulted for management. ABIs performed on 1/1 and are significant for moderate bilateral disease. Patient underwent angiogram on 1/2, with vascular performing left dorsalis pedis and left anterior tibial angioplasty. Patient started on Aspirin  325 mg po Daily and Clopidogrel  75 mg po Daily. Right lower extremity angiogram performed on 1/5 with laser arthrectomy of right anterior tibial artery and balloon angioplasty. Patient underwent Surgery as above and timing of LLE Amputation to be determined by Vascular Surgery   Diabetes Mellitus Type 2: Well controlled based on hemoglobin A1C of 6.5%. Continue Very Sensitive Novolog  SSI AC/HS. CTM CBGs per Protocol. CBG Trend:  Recent Labs  Lab 11/10/24 2115 11/11/24 0548 11/11/24 1101 11/11/24 1554 11/11/24 2109 11/12/24 0746 11/12/24 0933  GLUCAP 194* 112* 134* 137* 193* 160* 142*   BPH / Urinary Retention: Patient is s/p history of suprapubic catheter placement. Patient follows with Urology as an outpatient.  HypoNatremia: Na+ Trend:  Recent Labs  Lab 11/03/24 1940 11/04/24 2030 11/06/24 0758 11/09/24 1558 11/10/24 0342 11/12/24 0625  NA 133* 134* 135 131* 134* 132*  -CTM and Trend and Repeat CMP in the  AM    Dementia: Appears to be mild, however patient developed some evidence of delirium on 1/3. Patient is already on  Risperdal . Patient with hyperactive and hypoactive delirium. CT head unremarkable for acute process. Unclear if episodes are related to Risperidone  so this was discontinued. C/w Delirium precautions  Microcytic Anemia: Hgb/Hct Trend:  Recent Labs  Lab 11/03/24 1940 11/04/24 1207 11/06/24 0758 11/08/24 0333 11/09/24 1558 11/10/24 0342 11/12/24 0518  HGB 12.7* 13.4 11.7* 10.9* 11.6* 11.4* 11.0*  HCT 37.5* 39.8 34.3* 31.7* 34.1* 32.8* 31.2*  MCV 81.0 80.9 80.3 79.8* 80.4 79.6* 77.6*  -Check Anemia Panel in the AM. CTM for S/Sx of Bleeding. No overt bleeding noted. Repeat CBC in the AM  Thrombocytosis: Improving. In the setting of Infection. Plt Count Trend:  Recent Labs  Lab 11/03/24 1940 11/04/24 1207 11/06/24 0758 11/08/24 0333 11/09/24 1558 11/10/24 0342 11/12/24 0518  PLT 733* 717* 653* 668* 575* 658* 554*  -CTM and Trend and repeat CBC in the AM   Overweight: Complicates overall prognosis and care. Estimated body mass index is 27.35 kg/m as calculated from the following:   Height as of this encounter: 5' 8 (1.727 m).   Weight as of this encounter: 81.6 kg. Weight Loss and Dietary Counseling given   DVT prophylaxis: None given current surgical intervention     Code Status: Full Code Family Communication: No family present @ bedside  Disposition Plan:  Level of care: Med-Surg Status is: Inpatient Remains inpatient appropriate because: Needs further clinical improvement and clearance by the specialists   Consultants:  Podiatry Vascular Surgery  Procedures:  Procedure by Dr. Marsa Honour 11/12/24:  1.  Irrigation and debridement with fifth metatarsal base resection, right foot 2.  Application antibiotic beads with vancomycin  and tobramycin , right foot 3.  Application negative pressure wound therapy 4 x 3.5 x 2 cm, right foot    Procedure Performed by Dr. Penne Colorado 11/10/24: 1.  Percutaneous ultrasound-guided cannulation left common femoral artery with  Mynx device closure 2.  Catheter selection of aorta and aortogram and right lower extremity angiography 3.  Laser athrectomy right anterior tibial artery with 1.9 mm [place] and plain balloon angioplasty with 3 mm coyote  Antimicrobials:  Anti-infectives (From admission, onward)    Start     Dose/Rate Route Frequency Ordered Stop   11/12/24 1102  tobramycin  (NEBCIN ) injection  Status:  Discontinued          As needed 11/12/24 1133 11/12/24 1134   11/12/24 1043  vancomycin  (VANCOCIN ) powder  Status:  Discontinued          As needed 11/12/24 1043 11/12/24 1115   11/11/24 2200  metroNIDAZOLE  (FLAGYL ) tablet 500 mg        500 mg Oral Every 12 hours 11/11/24 0943     11/05/24 2200  linezolid  (ZYVOX ) tablet 600 mg       Placed in And Linked Group   600 mg Oral Every 12 hours 11/05/24 1341     11/05/24 1445  ceFEPIme  (MAXIPIME ) 2 g in sodium chloride  0.9 % 100 mL IVPB        2 g 200 mL/hr over 30 Minutes Intravenous Every 12 hours 11/05/24 1350     11/05/24 1445  metroNIDAZOLE  (FLAGYL ) IVPB 500 mg  Status:  Discontinued        500 mg 100 mL/hr over 60 Minutes Intravenous Every 12 hours 11/05/24 1352 11/11/24 0943   11/04/24 1430  Ampicillin -Sulbactam (UNASYN ) 3 g in sodium chloride  0.9 %  100 mL IVPB  Status:  Discontinued        3 g 200 mL/hr over 30 Minutes Intravenous Every 6 hours 11/04/24 1405 11/05/24 1340   11/04/24 1400  linezolid  (ZYVOX ) tablet 600 mg  Status:  Discontinued       Placed in And Linked Group   600 mg Oral Every 12 hours 11/04/24 1355 11/05/24 1341   11/04/24 1200  cefTRIAXone  (ROCEPHIN ) 2 g in sodium chloride  0.9 % 100 mL IVPB        2 g 200 mL/hr over 30 Minutes Intravenous  Once 11/04/24 1157 11/04/24 1241       Subjective: Seen and examined at bedside after surgical intervention and was complain of some right foot pain.  No nausea or vomiting. A little confused but is alert and oriented x 2.  Denies any other concerns or complaints at this  time.  Objective: Vitals:   11/12/24 1130 11/12/24 1145 11/12/24 1218 11/12/24 1417  BP: (!) 105/59 125/60 (!) 140/64 123/79  Pulse: (!) 59 62 61 67  Resp: 16 18 16 16   Temp:  98.3 F (36.8 C) 98.3 F (36.8 C) 97.7 F (36.5 C)  TempSrc:   Oral Oral  SpO2: 100% 100% 100% 100%  Weight:      Height:        Intake/Output Summary (Last 24 hours) at 11/12/2024 1557 Last data filed at 11/12/2024 1120 Gross per 24 hour  Intake 300 ml  Output 555 ml  Net -255 ml   Filed Weights   11/04/24 1400 11/12/24 0930  Weight: 81.6 kg 81.6 kg   Examination: Physical Exam:  Constitutional: WN/WD overweight elderly African-American male in no acute distress Respiratory: Diminished to auscultation bilaterally, no wheezing, rales, rhonchi or crackles. Normal respiratory effort and patient is not tachypenic. No accessory muscle use.  Unlabored breathing but is wearing supplemental oxygen nasal cannula Cardiovascular: RRR, no murmurs / rubs / gallops. S1 and S2 auscultated.  Right foot is wrapped Abdomen: Soft, non-tender, slightly distended secondary to body habitus. Bowel sounds positive.  GU: Deferred. Musculoskeletal: Right leg foot is wrapped. Neurologic: CN 2-12 grossly intact with no focal deficits. Romberg sign cerebellar reflexes not assessed.  Psychiatric: He is awake and alert and oriented x 2.   Data Reviewed: I have personally reviewed following labs and imaging studies  CBC: Recent Labs  Lab 11/06/24 0758 11/08/24 0333 11/09/24 1558 11/10/24 0342 11/12/24 0518  WBC 23.1* 16.4* 16.4* 14.5* 18.7*  HGB 11.7* 10.9* 11.6* 11.4* 11.0*  HCT 34.3* 31.7* 34.1* 32.8* 31.2*  MCV 80.3 79.8* 80.4 79.6* 77.6*  PLT 653* 668* 575* 658* 554*   Basic Metabolic Panel: Recent Labs  Lab 11/06/24 0758 11/09/24 1558 11/10/24 0342 11/12/24 0625  NA 135 131* 134* 132*  K 4.4 4.6 4.4 4.6  CL 100 98 100 97*  CO2 20* 23 22 25   GLUCOSE 103* 93 82 172*  BUN 12 16 17  26*  CREATININE 1.05 1.05  0.98 1.17  CALCIUM  8.9 9.0 8.8* 9.5   GFR: Estimated Creatinine Clearance: 43 mL/min (by C-G formula based on SCr of 1.17 mg/dL). Liver Function Tests: Recent Labs  Lab 11/06/24 0758 11/09/24 1558  AST 25 34  ALT 5 12  ALKPHOS 72 70  BILITOT 0.5 0.3  PROT 7.7 7.8  ALBUMIN 3.5 3.5   No results for input(s): LIPASE, AMYLASE in the last 168 hours. Recent Labs  Lab 11/09/24 1558  AMMONIA 20   Coagulation Profile: No results for input(s):  INR, PROTIME in the last 168 hours. Cardiac Enzymes: No results for input(s): CKTOTAL, CKMB, CKMBINDEX, TROPONINI in the last 168 hours. BNP (last 3 results) No results for input(s): PROBNP in the last 8760 hours. HbA1C: No results for input(s): HGBA1C in the last 72 hours. CBG: Recent Labs  Lab 11/11/24 1101 11/11/24 1554 11/11/24 2109 11/12/24 0746 11/12/24 0933  GLUCAP 134* 137* 193* 160* 142*   Lipid Profile: No results for input(s): CHOL, HDL, LDLCALC, TRIG, CHOLHDL, LDLDIRECT in the last 72 hours. Thyroid Function Tests: Recent Labs    11/09/24 1558  TSH 1.560   Anemia Panel: Recent Labs    11/09/24 1558  VITAMINB12 513  FOLATE 13.6   Sepsis Labs: Recent Labs  Lab 11/06/24 0758  LATICACIDVEN 1.3   Recent Results (from the past 240 hours)  Blood culture (routine x 2)     Status: None   Collection Time: 11/04/24  8:30 PM   Specimen: BLOOD LEFT ARM  Result Value Ref Range Status   Specimen Description   Final    BLOOD LEFT ARM Performed at [facility], [address], [place], [place] [address]    Special Requests   Final    BOTTLES DRAWN AEROBIC AND ANAEROBIC Blood Culture adequate volume Performed at [facility], [address], [place], [place] [address]    Culture   Final    NO GROWTH 5 DAYS Performed at [facility], [address], [place], [place] [address]    Report Status 11/10/2024 FINAL  Final    Radiology  Studies: No results found.  Scheduled Meds:  aspirin   325 mg Oral Daily   clopidogrel   75 mg Oral Q breakfast   feeding supplement  237 mL Oral BID WC   insulin  aspart  0-5 Units Subcutaneous QHS   insulin  aspart  0-6 Units Subcutaneous TID WC   linezolid   600 mg Oral Q12H   metroNIDAZOLE   500 mg Oral Q12H   multivitamin with minerals  1 tablet Oral Daily   nutrition supplement (JUVEN)  1 packet Oral BID BM   rosuvastatin   20 mg Oral Daily   sodium chloride  flush  3 mL Intravenous Q12H   sodium chloride  flush  3 mL Intravenous Q12H   Continuous Infusions:  ceFEPime  (MAXIPIME ) IV 2 g (11/12/24 1433)    LOS: 8 days   Alejandro Marker, DO [facility] Available via Epic secure chat 7am-7pm After these hours, please refer to coverage provider listed on [email or web address] 11/12/2024, 3:57 PM  "

## 2024-11-12 NOTE — Progress Notes (Signed)
 Orthopedic Tech Progress Note Patient Details:  Patrick [name] [birth year] [id number]  Ortho Devices Type of Ortho Device: Postop shoe/boot Ortho Device/Splint Location: RLE Ortho Device/Splint Interventions: Ordered, Application, Adjustment, Removal  Fitted patient with large shoe, medium was a little to small Post Interventions Patient Tolerated: Well Instructions Provided: Care of device  Delanna LITTIE Pac 11/12/2024, 12:34 PM

## 2024-11-12 NOTE — Progress Notes (Signed)
 Notified by patient's bedside RN that the wound VAC was alerting blockage.  Went to assess with him and change VAC.  Unfortunately VAC again initially worked and then stopped working with pressure dropping and alerting blockage.  Upon further investigation it appears that the patient is clotting in the [facility] tubing which is causing blockage.  Mild venous oozing from the surgical site attempted to use battery electrocautery to decrease bleeding at the margins.  For now we will discontinue the wound VAC and switch to saline wet-to-dry packing in the wound with dry gauze 4 x 4 ABD pad Kerlix Ace wrapped with compression.  Will see if VAC can be reapplied later this week by wound ostomy care nurse will consult them for VAC change on Friday.  Continue elevation and bedrest for now given bleeding concern hold anticoagulation and if further bleeding occurs please reinforce dressing as needed

## 2024-11-12 NOTE — Plan of Care (Signed)

## 2024-11-12 NOTE — Progress Notes (Signed)
 " PROGRESS NOTE    Patrick [name]  FMW:[id number] DOB: May 26, 1935 DOA: 11/03/2024 PCP: Maree Leni Edyth DELENA, MD   Brief Narrative:  Patrick [name] is a 89 y.o. male with a history of dementia, diabetes mellitus type 2, BPH/urinary retention s/p suprapubic catheter, pancreatic mass.  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 Vascular surgery and podiatry consulted. Empiric IV antibiotics started. Patient underwent angiogram with left dorsalis pedis and left anterior tibial angioplasty.  He is now status post I&D with fifth metatarsal base resection of the right foot.  Current plan is for left lower extremity BKA and he has tentatively scheduled for Left BKA late AM on 11/14/24.   Assessment and Plan:  Sepsis: Present on admission. Secondary to foot infection and Osteomyelitis. Blood cultures obtained and patient started empirically on antibiotics. Blood cultures no growth x5 days. Continue Antibiotics as below   Left Foot Abscess/Osteomyelitis/Myositis: Noted on MRI. Podiatry and vascular surgery consulted on admission. Empiric Linezolid , Ceftriaxone  and Unasyn  started with transition to linezolid , cefepime  and metronidazole . Podiatry recommending leg amputation (AKA vs BKA). Vascular surgery aware and will work on timing of amputation of LLE. Continue Linezolid , cefepime  and metronidazole  until surgery. Vascular Surgery to follow up 11/13/24 about the timing of BKA. WBC Trend:  Recent Labs  Lab 11/04/24 1207 11/06/24 0758 11/08/24 0333 11/09/24 1558 11/10/24 0342 11/12/24 0518 11/13/24 0204  WBC 23.1* 23.1* 16.4* 16.4* 14.5* 18.7* 17.6*    Right Foot Fifth Metatarsal Osteomyelitis: Noted on MRI. Podiatry with plan for 5th ray resection I&D and he is postoperative day 0 with antibiotic bead application of vancomycin  tobramycin  as well as application of negative pressure  wound.  However negative pressure wound did not work properly and started getting blockages and there is oozing noted from the surgical site.  The VAC was discontinued and he is now switched to saline wet-to-dry packing with wound and dry gauze 4 x 4 ABD pad Kerlix Ace wrapped with compression.  The podiatry team feels that the wound VAC is likely be reapplied later this week by the wound ostomy nurse in the room consulting for wound VAC change on Friday. Continue Antibiotics w/ po Linezolid  and po Metronidazole  as well as IV Cefepime .  -Aerobic/Anaerobic Cx pending    Possible Critical limb ischemia of bilateral lower extremity: Vascular surgery consulted for management. ABIs performed on 1/1 and are significant for moderate bilateral disease. Patient underwent angiogram on 1/2, with vascular performing left dorsalis pedis and left anterior tibial angioplasty. Patient started on Aspirin  325 mg po Daily and Clopidogrel  75 mg po Daily. Right lower extremity angiogram performed on 1/5 with laser arthrectomy of right anterior tibial artery and balloon angioplasty. Patient underwent Surgery as above and timing of LLE Amputation to be determined by Vascular Surgery and they are anticipating tentative Left BKA late AM 11/14/24  Hyponatremia: Na+ Trend:  Recent Labs  Lab 11/03/24 1940 11/04/24 2030 11/06/24 0758 11/09/24 1558 11/10/24 0342 11/12/24 0625 11/13/24 0204  NA 133* 134* 135 131* 134* 132* 131*  -Fluctuating. CTM and Trend and repeat CMP in the AM   Diabetes Mellitus Type 2: Well controlled based on hemoglobin A1C of 6.5%. Continue Very Sensitive Novolog  SSI AC/HS. CTM CBGs per Protocol. CBG Trend:  Recent Labs  Lab 11/11/24 2109 11/12/24 0746 11/12/24 0933 11/12/24 1627 11/12/24 2130 11/13/24 0746 11/13/24 1139  GLUCAP 193* 160* 142* 99 145* 119* 110*  BPH / Urinary Retention: Patient is s/p history of suprapubic catheter placement. Patient follows with Urology as an  outpatient.  HypoNatremia: Na+ Trend:  Recent Labs  Lab 11/03/24 1940 11/04/24 2030 11/06/24 0758 11/09/24 1558 11/10/24 0342 11/12/24 0625 11/13/24 0204  NA 133* 134* 135 131* 134* 132* 131*  -CTM and Trend and Repeat CMP in the AM    Dementia: Appears to be mild, however patient developed some evidence of delirium on 1/3. Patient is already on Risperdal . Patient with hyperactive and hypoactive delirium. CT head unremarkable for acute process. Unclear if episodes are related to Risperidone  so this was discontinued. C/w Delirium precautions and started Atarax  25 mg TIDprn Anxiety   Microcytic Anemia: Hgb/Hct Trend:  Recent Labs  Lab 11/04/24 1207 11/06/24 0758 11/08/24 0333 11/09/24 1558 11/10/24 0342 11/12/24 0518 11/13/24 0204  HGB 13.4 11.7* 10.9* 11.6* 11.4* 11.0* 10.3*  HCT 39.8 34.3* 31.7* 34.1* 32.8* 31.2* 29.5*  MCV 80.9 80.3 79.8* 80.4 79.6* 77.6* 79.1*  -Checked Anemia Panel and showed an iron level of 35, UIBC 197, TIBC of 232, saturation ratios of 50%, ferritin level of 107 prolactin level of 9.1 and vitamin B12 584. CTM for S/Sx of Bleeding. No overt bleeding noted. Repeat CBC in the AM  Thrombocytosis: Improving. In the setting of Infection. Current Plt Count Trend:  Recent Labs  Lab 11/04/24 1207 11/06/24 0758 11/08/24 0333 11/09/24 1558 11/10/24 0342 11/12/24 0518 11/13/24 0204  PLT 717* 653* 668* 575* 658* 554* 530*  -CTM and Trend and repeat CBC in the AM   Hypoalbuminemia: Patient's Albumin Lvl Trend went from 3.9 x2 -> 3.5 x2 -> 3.4. CTM & Trend & Repeat CMP in the AM  Overweight: Complicates overall prognosis and care. Estimated body mass index is 27.35 kg/m as calculated from the following:   Height as of this encounter: 5' 8 (1.727 m).   Weight as of this encounter: 81.6 kg. Weight Loss and Dietary Counseling given   DVT prophylaxis: SCDs; Holding Pharmacologic Prophylaxis in anticipation of LLE BKA in the AM    Code Status: Full  Code Family Communication: No family present @ bedside   Disposition Plan:  Level of care: Med-Surg Status is: Inpatient Remains inpatient appropriate because: Needs further clinical improvement and undergoing tentative LLE BKA on 1/9 and will need further clearance by the specialists   Consultants:  Podiatry Vascular Surgery  Procedures:  Procedure by Dr. Marsa Honour 11/12/24:  1.  Irrigation and debridement with fifth metatarsal base resection, right foot 2.  Application antibiotic beads with vancomycin  and tobramycin , right foot 3.  Application negative pressure wound therapy 4 x 3.5 x 2 cm, right foot    Procedure Performed by Dr. Penne Colorado 11/10/24: 1.  Percutaneous ultrasound-guided cannulation left common femoral artery with Mynx device closure 2.  Catheter selection of aorta and aortogram and right lower extremity angiography 3.  Laser athrectomy right anterior tibial artery with 1.9 mm [place] and plain balloon angioplasty with 3 mm coyote  Antimicrobials:  Anti-infectives (From admission, onward)    Start     Dose/Rate Route Frequency Ordered Stop   11/12/24 1102  tobramycin  (NEBCIN ) injection  Status:  Discontinued          As needed 11/12/24 1133 11/12/24 1134   11/12/24 1043  vancomycin  (VANCOCIN ) powder  Status:  Discontinued          As needed 11/12/24 1043 11/12/24 1115   11/11/24 2200  metroNIDAZOLE  (FLAGYL ) tablet 500 mg  500 mg Oral Every 12 hours 11/11/24 0943     11/05/24 2200  linezolid  (ZYVOX ) tablet 600 mg       Placed in And Linked Group   600 mg Oral Every 12 hours 11/05/24 1341     11/05/24 1445  ceFEPIme  (MAXIPIME ) 2 g in sodium chloride  0.9 % 100 mL IVPB        2 g 200 mL/hr over 30 Minutes Intravenous Every 12 hours 11/05/24 1350     11/05/24 1445  metroNIDAZOLE  (FLAGYL ) IVPB 500 mg  Status:  Discontinued        500 mg 100 mL/hr over 60 Minutes Intravenous Every 12 hours 11/05/24 1352 11/11/24 0943   11/04/24 1430   Ampicillin -Sulbactam (UNASYN ) 3 g in sodium chloride  0.9 % 100 mL IVPB  Status:  Discontinued        3 g 200 mL/hr over 30 Minutes Intravenous Every 6 hours 11/04/24 1405 11/05/24 1340   11/04/24 1400  linezolid  (ZYVOX ) tablet 600 mg  Status:  Discontinued       Placed in And Linked Group   600 mg Oral Every 12 hours 11/04/24 1355 11/05/24 1341   11/04/24 1200  cefTRIAXone  (ROCEPHIN ) 2 g in sodium chloride  0.9 % 100 mL IVPB        2 g 200 mL/hr over 30 Minutes Intravenous  Once 11/04/24 1157 11/04/24 1241       Subjective: Seen and examined at bedside and was little confused and overall emotional and tearful.  Complaining of some right foot pain.  No nausea or vomiting.  Denied any other concerns or complaints this time but intermittently confused and a little delirious.  Objective: Vitals:   11/12/24 1417 11/12/24 1946 11/13/24 0440 11/13/24 0747  BP: 123/79 137/63 116/77 139/66  Pulse: 67 67 69 62  Resp: 16 17 17 18   Temp: 97.7 F (36.5 C) 98.4 F (36.9 C) 98.4 F (36.9 C) (!) 97.5 F (36.4 C)  TempSrc: Oral Axillary Axillary Oral  SpO2: 100% 100% 95% 95%  Weight:      Height:        Intake/Output Summary (Last 24 hours) at 11/13/2024 1515 Last data filed at 11/13/2024 1156 Gross per 24 hour  Intake 400.06 ml  Output 550 ml  Net -149.94 ml   Filed Weights   11/04/24 1400 11/12/24 0930  Weight: 81.6 kg 81.6 kg   Examination: Physical Exam:  Constitutional: Overweight elderly chronically ill-appearing African-American male who appears little uncomfortable and appears somewhat confused Respiratory: Diminished to auscultation bilaterally with some coarse breath sounds, no wheezing, rales, rhonchi or crackles. Normal respiratory effort and patient is not tachypenic. No accessory muscle use.  Unlabored breathing  Cardiovascular: RRR, no murmurs / rubs / gallops. S1 and S2 auscultated.  Right foot is wrapped Abdomen: Soft, non-tender, mildly distended secondary body habitus.  Bowel sounds positive.  GU: Deferred. Musculoskeletal: Right foot is wrapped Neurologic: CN 2-12 grossly intact with no focal deficits. Romberg sign and cerebellar reflexes not assessed.  Psychiatric: Awake but is confused  Data Reviewed: I have personally reviewed following labs and imaging studies  CBC: Recent Labs  Lab 11/08/24 0333 11/09/24 1558 11/10/24 0342 11/12/24 0518 11/13/24 0204  WBC 16.4* 16.4* 14.5* 18.7* 17.6*  NEUTROABS  --   --   --   --  8.8*  HGB 10.9* 11.6* 11.4* 11.0* 10.3*  HCT 31.7* 34.1* 32.8* 31.2* 29.5*  MCV 79.8* 80.4 79.6* 77.6* 79.1*  PLT 668* 575* 658* 554* 530*  Basic Metabolic Panel: Recent Labs  Lab 11/09/24 1558 11/10/24 0342 11/12/24 0625 11/13/24 0204  NA 131* 134* 132* 131*  K 4.6 4.4 4.6 4.3  CL 98 100 97* 98  CO2 23 22 25 26   GLUCOSE 93 82 172* 135*  BUN 16 17 26* 23  CREATININE 1.05 0.98 1.17 1.08  CALCIUM  9.0 8.8* 9.5 9.5  MG  --   --   --  2.1  PHOS  --   --   --  2.7   GFR: Estimated Creatinine Clearance: 46.6 mL/min (by C-G formula based on SCr of 1.08 mg/dL). Liver Function Tests: Recent Labs  Lab 11/09/24 1558 11/13/24 0204  AST 34 19  ALT 12 10  ALKPHOS 70 57  BILITOT 0.3 0.3  PROT 7.8 7.1  ALBUMIN 3.5 3.4*   No results for input(s): LIPASE, AMYLASE in the last 168 hours. Recent Labs  Lab 11/09/24 1558  AMMONIA 20   Coagulation Profile: No results for input(s): INR, PROTIME in the last 168 hours. Cardiac Enzymes: No results for input(s): CKTOTAL, CKMB, CKMBINDEX, TROPONINI in the last 168 hours. BNP (last 3 results) No results for input(s): PROBNP in the last 8760 hours. HbA1C: No results for input(s): HGBA1C in the last 72 hours. CBG: Recent Labs  Lab 11/12/24 0933 11/12/24 1627 11/12/24 2130 11/13/24 0746 11/13/24 1139  GLUCAP 142* 99 145* 119* 110*   Lipid Profile: No results for input(s): CHOL, HDL, LDLCALC, TRIG, CHOLHDL, LDLDIRECT in the last 72  hours. Thyroid Function Tests: No results for input(s): TSH, T4TOTAL, FREET4, T3FREE, THYROIDAB in the last 72 hours.  Anemia Panel: Recent Labs    11/13/24 0204  VITAMINB12 584  FOLATE 9.1  FERRITIN 107  TIBC 232*  IRON 35*  RETICCTPCT 1.6   Sepsis Labs: No results for input(s): PROCALCITON, LATICACIDVEN in the last 168 hours.  Recent Results (from the past 240 hours)  Blood culture (routine x 2)     Status: None   Collection Time: 11/04/24  8:30 PM   Specimen: BLOOD LEFT ARM  Result Value Ref Range Status   Specimen Description   Final    BLOOD LEFT ARM Performed at [facility], [address] [place], [place] [address]    Special Requests   Final    BOTTLES DRAWN AEROBIC AND ANAEROBIC Blood Culture adequate volume Performed at [facility], [address], [place], [place] [address]    Culture   Final    NO GROWTH 5 DAYS Performed at [facility], [address] [place], [place] [address]    Report Status 11/10/2024 FINAL  Final  Aerobic/Anaerobic Culture w Gram Stain (surgical/deep wound)     Status: None (Preliminary result)   Collection Time: 11/12/24 11:04 AM   Specimen: Foot, Right; Tissue  Result Value Ref Range Status   Specimen Description TISSUE  Final   Special Requests RIGHT FIFTH METATARSAL BASE RESECTION  Final   Gram Stain   Final    FEW WBC PRESENT,BOTH PMN AND MONONUCLEAR RARE GRAM POSITIVE COCCI    Culture   Final    NO GROWTH < 24 HOURS Performed at [facility], [address] [place], [place] [address]    Report Status PENDING  Incomplete    Radiology Studies: DG Foot 2 Views Right Result Date: 11/12/2024 EXAM: 1 OR 2 VIEW(S) XRAY OF THE _LATERALITY_ FOOT 11/12/2024 12:25:04 PM COMPARISON: MRI 11/05/2024. CLINICAL HISTORY: Post-operative state. FINDINGS: BONES AND JOINTS: Antibiotic  beads in place at the site of the resected 5th metatarsal base. No malalignment. SOFT  TISSUES: Vacuum wound apparatus noted. Arterial calcifications. IMPRESSION: 1. Antibiotic beads at the resected fifth metatarsal base with a vacuum wound apparatus in place. 2. Chronic arterial calcifications. Electronically signed by: Ryan Salvage MD MD 11/12/2024 04:18 PM EST RP Workstation: [id number]   Scheduled Meds:  aspirin   325 mg Oral Daily   clopidogrel   75 mg Oral Q breakfast   feeding supplement  237 mL Oral BID WC   insulin  aspart  0-5 Units Subcutaneous QHS   insulin  aspart  0-6 Units Subcutaneous TID WC   linezolid   600 mg Oral Q12H   metroNIDAZOLE   500 mg Oral Q12H   multivitamin with minerals  1 tablet Oral Daily   nutrition supplement (JUVEN)  1 packet Oral BID BM   rosuvastatin   20 mg Oral Daily   sodium chloride  flush  3 mL Intravenous Q12H   sodium chloride  flush  3 mL Intravenous Q12H   Continuous Infusions:  ceFEPime  (MAXIPIME ) IV 2 g (11/13/24 1455)    LOS: 9 days   Alejandro Marker, DO [facility] Available via Epic secure chat 7am-7pm After these hours, please refer to coverage provider listed on [email or web address] 11/13/2024, 3:15 PM  "

## 2024-11-12 NOTE — Op Note (Signed)
 Full Operative Report  Date of Operation: 11:31 AM, 11/12/2024   Patient: Patrick [name] Pouch - 89 y.o. male  Surgeon: Malvin Marsa F[facility], DPM   Assistant: None  Diagnosis: Osteomeylitis of right fifth metatarsal, ulceration  Procedure:  1.  Irrigation and debridement with fifth metatarsal base resection, right foot 2.  Application antibiotic beads with vancomycin  and tobramycin , right foot 3.  Application negative pressure wound therapy 4 x 3.5 x 2 cm, right foot    Anesthesia: Monitor Anesthesia Care  No responsible provider has been recorded for the case.  Anesthesiologist: Maryclare Cornet, MD CRNA: Christopher Comings, CRNA   Estimated Blood Loss: 30 mL  Hemostasis: 1) Anatomical dissection, mechanical compression, electrocautery 2) no tourniquet was used during procedure  Implants: Implant Name Type Inv. Item Serial No. Manufacturer Lot No. LRB No. Used Action  BEADS BIO ARTH CALC SULFAT 5CC - [id number] Bone Implant BEADS BIO ARTH CALC SULFAT 5CC  [employer]  Right 1 Implanted    Materials: Wound VAC black sponge  Injectables: 1) Pre-operatively: 10 cc of 50:50 mixture 1%lidocaine  plain and 0.5% marcaine  plain 2) Post-operatively: None   Specimens: - Pathology: Fifth metatarsal base - Microbiology: Tissue culture including ulceration and bone fifth metatarsal right foot   Antibiotics: IV antibiotics given per schedule on the floor  Drains: None  Complications: Patient tolerated the procedure well without complication.   Operative findings: As below in detailed report  Indications for Procedure: QUEST TAVENNER presents to [facility], [place] with a chief complaint of chronic ulceration right lateral foot over the fifth metatarsal base.  Concern for underlying osteomyelitis on MRI.  The patient has failed conservative treatments of various modalities. At this time the patient has elected to proceed with surgical correction. All alternatives, risks, and  complications of the procedures were thoroughly explained to the patient. Patient exhibits appropriate understanding of all discussion points and informed consent was signed and obtained in the chart with no guarantees to surgical outcome given or implied.  Description of Procedure: Patient was brought to the operating room. Patient remained on their hospital bed in the supine position. A surgical timeout was performed and all members of the operating room, the procedure, and the surgical site were identified. anesthesia occurred as per anesthesia record. Local anesthetic as previously described was then injected about the operative field in a local infiltrative block.  The operative lower extremity as noted above was then prepped and draped in the usual sterile manner. The following procedure then began.  Attention was directed to the ulceration of the lateral aspect of the right midfoot.  The ulceration was centered over the fifth metatarsal base styloid process.  An elliptical incision was then made around the ulceration so as to excise the entire ulceration.  The ulceration underlying tissue was sent to micro for culture.  The fifth metatarsal base was then exposed and dissected with 15 blade.  A rongeur was used to harvest a bone culture from the lateral aspect of the fifth metatarsal base.  This was sent with the above tissue culture.  Next using a sagittal saw a transverse osteotomy was made approximately 2 cm from the fifth TMT J.  The fifth metatarsal base was resected in entirety with  transection of the peroneus brevis tendon.  The fifth metatarsal base bone fragment was sent for pathology with the distal margin being the cut surface.  There was no purulence noted and the bone seemed overall relatively normal and possibly slightly soft.  The area was then thoroughly irrigated with sterile saline.  Further debridement was carried out with rongeur.  Hemostasis was then performed with electrocautery  as needed.  Good bleeding was seen at the resection site.  Antibiotic beads were then mixed on the back table.  Calcium  sulfate antibiotic beads were mixed with vancomycin  and tobramycin  per manufacture guidelines.  Small beads were made.  These beads were then implanted within the surgical cavity distal to the articulation of the fifth met with the cuboid.  Next decision was made to apply wound VAC given soft tissue void after resection of the ulceration.  There would have been too much tension to close the wound.  The wound was measured and found to be approximately 4 x 3.5 x 2 cm.  Wound VAC black sponge was then cut to this measurement and placed within the cavity where the fifth metatarsal base was.  The black sponge was then secured with derma tack drape.  The wound VAC was attached and set to 125 mm continuous suction.  No leaks were detected.  The surgical site was then dressed with 4 x 4 Kerlix Ace. The patient tolerated both the procedure and anesthesia well with vital signs stable throughout. The patient was transferred in good condition and all vital signs stable  from the OR to recovery under the discretion of anesthesia.  Condition: Vital signs stable, neurovascular status unchanged from preoperative   Surgical plan:  Expect clean surgical margin at the resection site follow-up pathology however.  Patient will need prolonged wound VAC therapy on the right side.  Still high risk for further surgical intervention and limb loss on the right.  Likely okay to de-escalate to 1 to 2 weeks p.o. antibiotics once cultures and pathology result.  The patient will be weightbearing as tolerated in a postop shoe to the operative limb until further instructed. The dressing is to remain clean, dry, and intact. Will continue to follow unless noted elsewhere.   Marsa Honour, DPM [facility]

## 2024-11-12 NOTE — Progress Notes (Signed)
 History and Physical Interval Note:  11/12/2024 9:30 AM  Patrick [name]  has presented today for surgery, with the diagnosis of osteomyeltis of right 5th met base.  The various methods of treatment have been discussed with the patient and family. After consideration of risks, benefits and other options for treatment, the patient has consented to   Procedures with comments: AMPUTATION, FOOT, RAY (Right) - Right midfoot ulcer debridement. partial 5th met resection, possible wound vac/ abx beads as a surgical intervention.  The patient's history has been reviewed, patient examined, no change in status, stable for surgery.  I have reviewed the patient's chart and labs.  Questions were answered to the patient's satisfaction.     Patrick [name]

## 2024-11-12 NOTE — Anesthesia Preprocedure Evaluation (Signed)
"                                    Anesthesia Evaluation  Patient identified by MRN, date of birth, ID band Patient awake    Reviewed: Allergy & Precautions, H&P , NPO status , Patient's Chart, lab work & pertinent test results  Airway Mallampati: II   Neck ROM: full    Dental   Pulmonary former smoker   breath sounds clear to auscultation       Cardiovascular negative cardio ROS  Rhythm:regular Rate:Normal     Neuro/Psych  PSYCHIATRIC DISORDERS     Dementia    GI/Hepatic   Endo/Other    Renal/GU      Musculoskeletal   Abdominal   Peds  Hematology   Anesthesia Other Findings   Reproductive/Obstetrics                              Anesthesia Physical Anesthesia Plan  ASA: 3  Anesthesia Plan: MAC   Post-op Pain Management:    Induction: Intravenous  PONV Risk Score and Plan: 1 and Propofol  infusion and Treatment may vary due to age or medical condition  Airway Management Planned: Simple Face Mask  Additional Equipment:   Intra-op Plan:   Post-operative Plan:   Informed Consent: I have reviewed the patients History and Physical, chart, labs and discussed the procedure including the risks, benefits and alternatives for the proposed anesthesia with the patient or authorized representative who has indicated his/her understanding and acceptance.     Dental advisory given  Plan Discussed with: CRNA, Anesthesiologist and Surgeon  Anesthesia Plan Comments:         Anesthesia Quick Evaluation  "

## 2024-11-12 NOTE — Transfer of Care (Signed)
 Immediate Anesthesia Transfer of Care Note  Patient: Patrick [name]  Procedure(s) Performed: AMPUTATION, FOOT, RAY DEBRIDEMENT FIFTH METATARSAL BASE WITH WOUND VACAND BEADS (Right)  Patient Location: PACU  Anesthesia Type:MAC  Level of Consciousness: drowsy  Airway & Oxygen Therapy: Patient Spontanous Breathing  Post-op Assessment: Report given to RN and Post -op Vital signs reviewed and stable  Post vital signs: Reviewed and stable  Last Vitals:  Vitals Value Taken Time  BP 109/68 11/12/24 11:18  Temp    Pulse 65 11/12/24 11:18  Resp 26 11/12/24 11:19  SpO2 100 % 11/12/24 11:18  Vitals shown include unfiled device data.  Last Pain:  Vitals:   11/12/24 0945  TempSrc:   PainSc: 0-No pain         Complications: There were no known notable events for this encounter.

## 2024-11-12 NOTE — Progress Notes (Addendum)
" °  Progress Note    11/12/2024 7:41 AM 2 Days Post-Op  Subjective:  no major complaints. Tearful when discussing amputation   Vitals:   11/11/24 2037 11/12/24 0438  BP: 123/69 (!) 141/78  Pulse: 87 75  Resp: 18 18  Temp: 98.4 F (36.9 C) 98.5 F (36.9 C)  SpO2: 100% 100%   Physical Exam: Cardiac:  regular Lungs:  non labored Extremities:  BLE well perfused and warm with palpable DP Neurologic: alert and oriented  CBC    Component Value Date/Time   WBC 18.7 (H) 11/12/2024 0518   RBC 4.02 (L) 11/12/2024 0518   HGB 11.0 (L) 11/12/2024 0518   HCT 31.2 (L) 11/12/2024 0518   PLT 554 (H) 11/12/2024 0518   MCV 77.6 (L) 11/12/2024 0518   MCH 27.4 11/12/2024 0518   MCHC 35.3 11/12/2024 0518   RDW 15.0 11/12/2024 0518   LYMPHSABS 4.5 (H) 11/04/2024 1207   MONOABS 1.1 (H) 11/04/2024 1207   EOSABS 0.1 11/04/2024 1207   BASOSABS 0.1 11/04/2024 1207    BMET    Component Value Date/Time   NA 132 (L) 11/12/2024 0625   K 4.6 11/12/2024 0625   CL 97 (L) 11/12/2024 0625   CO2 25 11/12/2024 0625   GLUCOSE 172 (H) 11/12/2024 0625   BUN 26 (H) 11/12/2024 0625   CREATININE 1.17 11/12/2024 0625   CALCIUM  9.5 11/12/2024 0625   GFRNONAA >60 11/12/2024 0625    INR No results found for: INR   Intake/Output Summary (Last 24 hours) at 11/12/2024 0741 Last data filed at 11/12/2024 0400 Gross per 24 hour  Intake 1 ml  Output 350 ml  Net -349 ml     Assessment/Plan:  89 y.o. male is s/p aortogram right lower extremity runoff with laser atherectomy of the right anterior tibial artery and balloon angioplasty  2 Days Post-Op   BLE well perfused and warm with palpable DP  Leukocytosis 18K. afebrile On Linezolid  and Metronidazole  Scheduled to go to OR today with Dr. Malvin for right foot debridement, 5th met resection and VAC Pending timing of Left Leg BKA   Teretha Damme, PA-C [facility] [phone] 11/12/2024 7:41 AM  I have independently interviewed  and examined patient and agree with PA assessment and plan above.  He will have anesthesia today and we will follow-up tomorrow about timing of BKA.  Tanganyika Bowlds C. Sheree, MD [facility] [place] Office: [phone] Pager: [phone]  "

## 2024-11-13 ENCOUNTER — Encounter (HOSPITAL_COMMUNITY): Payer: Self-pay | Admitting: Podiatry

## 2024-11-13 DIAGNOSIS — F03918 Unspecified dementia, unspecified severity, with other behavioral disturbance: Secondary | ICD-10-CM | POA: Diagnosis not present

## 2024-11-13 DIAGNOSIS — E871 Hypo-osmolality and hyponatremia: Secondary | ICD-10-CM | POA: Diagnosis not present

## 2024-11-13 DIAGNOSIS — E8809 Other disorders of plasma-protein metabolism, not elsewhere classified: Secondary | ICD-10-CM | POA: Diagnosis not present

## 2024-11-13 DIAGNOSIS — L0390 Cellulitis, unspecified: Secondary | ICD-10-CM | POA: Diagnosis not present

## 2024-11-13 LAB — PHOSPHORUS: Phosphorus: 2.7 mg/dL (ref 2.5–4.6)

## 2024-11-13 LAB — COMPREHENSIVE METABOLIC PANEL WITH GFR
ALT: 10 U/L (ref 0–44)
AST: 19 U/L (ref 15–41)
Albumin: 3.4 g/dL — ABNORMAL LOW (ref 3.5–5.0)
Alkaline Phosphatase: 57 U/L (ref 38–126)
Anion gap: 7 (ref 5–15)
BUN: 23 mg/dL (ref 8–23)
CO2: 26 mmol/L (ref 22–32)
Calcium: 9.5 mg/dL (ref 8.9–10.3)
Chloride: 98 mmol/L (ref 98–111)
Creatinine, Ser: 1.08 mg/dL (ref 0.61–1.24)
GFR, Estimated: >60 mL/min
Glucose, Bld: 135 mg/dL — ABNORMAL HIGH (ref 70–99)
Potassium: 4.3 mmol/L (ref 3.5–5.1)
Sodium: 131 mmol/L — ABNORMAL LOW (ref 135–145)
Total Bilirubin: 0.3 mg/dL (ref 0.0–1.2)
Total Protein: 7.1 g/dL (ref 6.5–8.1)

## 2024-11-13 LAB — GLUCOSE, CAPILLARY
Glucose-Capillary: 119 mg/dL — ABNORMAL HIGH (ref 70–99)
Glucose-Capillary: 110 mg/dL — ABNORMAL HIGH (ref 70–99)
Glucose-Capillary: 112 mg/dL — ABNORMAL HIGH (ref 70–99)
Glucose-Capillary: 166 mg/dL — ABNORMAL HIGH (ref 70–99)

## 2024-11-13 LAB — CBC WITH DIFFERENTIAL/PLATELET
Abs Immature Granulocytes: 0.22 K/uL — ABNORMAL HIGH (ref 0.00–0.07)
Basophils Absolute: 0.1 K/uL (ref 0.0–0.1)
Basophils Relative: 1 %
Eosinophils Absolute: 0.4 K/uL (ref 0.0–0.5)
Eosinophils Relative: 2 %
HCT: 29.5 % — ABNORMAL LOW (ref 39.0–52.0)
Hemoglobin: 10.3 g/dL — ABNORMAL LOW (ref 13.0–17.0)
Immature Granulocytes: 1 %
Lymphocytes Relative: 38 %
Lymphs Abs: 6.8 K/uL — ABNORMAL HIGH (ref 0.7–4.0)
MCH: 27.6 pg (ref 26.0–34.0)
MCHC: 34.9 g/dL (ref 30.0–36.0)
MCV: 79.1 fL — ABNORMAL LOW (ref 80.0–100.0)
Monocytes Absolute: 1.4 K/uL — ABNORMAL HIGH (ref 0.1–1.0)
Monocytes Relative: 8 %
Neutro Abs: 8.8 K/uL — ABNORMAL HIGH (ref 1.7–7.7)
Neutrophils Relative %: 50 %
Platelets: 530 K/uL — ABNORMAL HIGH (ref 150–400)
RBC: 3.73 MIL/uL — ABNORMAL LOW (ref 4.22–5.81)
RDW: 14.9 % (ref 11.5–15.5)
Smear Review: NORMAL
WBC: 17.6 K/uL — ABNORMAL HIGH (ref 4.0–10.5)
nRBC: 0 % (ref 0.0–0.2)

## 2024-11-13 LAB — RETICULOCYTES
Immature Retic Fract: 23.9 % — ABNORMAL HIGH (ref 2.3–15.9)
RBC.: 3.78 MIL/uL — ABNORMAL LOW (ref 4.22–5.81)
Retic Count, Absolute: 60.5 K/uL (ref 19.0–186.0)
Retic Ct Pct: 1.6 % (ref 0.4–3.1)

## 2024-11-13 LAB — FERRITIN: Ferritin: 107 ng/mL (ref 24–336)

## 2024-11-13 LAB — VITAMIN B12: Vitamin B-12: 584 pg/mL (ref 180–914)

## 2024-11-13 LAB — FOLATE: Folate: 9.1 ng/mL

## 2024-11-13 LAB — IRON AND TIBC
Iron: 35 ug/dL — ABNORMAL LOW (ref 45–182)
Saturation Ratios: 15 % — ABNORMAL LOW (ref 17.9–39.5)
TIBC: 232 ug/dL — ABNORMAL LOW (ref 250–450)
UIBC: 197 ug/dL

## 2024-11-13 LAB — MAGNESIUM: Magnesium: 2.1 mg/dL (ref 1.7–2.4)

## 2024-11-13 MED ORDER — HYDROXYZINE HCL 25 MG PO TABS: 25.0000 mg | ORAL_TABLET | Freq: Three times a day (TID) | ORAL | Status: DC | PRN

## 2024-11-13 NOTE — Consult Note (Addendum)
 WOC team consulted to reapply NPWT R foot post I&D and partial 5th ray resection by podiatry 11/12/2024; NPWT was placed in OR 1/7 but had to be removed due to bleeding.  Podiatry has followed with patient 1/8 with instructions to leave compression dressing on at this time and reapply NPWT 1/9.   WOC team will follow 11/15/2023 for application of NPWT R foot as requested.   Thank you,    Powell Bar MSN, RN-BC, [employer]

## 2024-11-13 NOTE — Plan of Care (Signed)

## 2024-11-13 NOTE — Anesthesia Postprocedure Evaluation (Addendum)
"   Anesthesia Post Note  Patient: Patrick [name]  Procedure(s) Performed: AMPUTATION, FOOT, RAY DEBRIDEMENT FIFTH METATARSAL BASE WITH WOUND VACAND BEADS (Right: Foot)     Patient location during evaluation: PACU Anesthesia Type: MAC Level of consciousness: awake and alert Pain management: pain level controlled Vital Signs Assessment: post-procedure vital signs reviewed and stable Respiratory status: spontaneous breathing, nonlabored ventilation, respiratory function stable and patient connected to nasal cannula oxygen Cardiovascular status: stable and blood pressure returned to baseline Postop Assessment: no apparent nausea or vomiting Anesthetic complications: no   There were no known notable events for this encounter.  Last Vitals:  Vitals:   11/13/24 0440 11/13/24 0747  BP: 116/77 139/66  Pulse: 69 62  Resp: 17 18  Temp: 36.9 C (!) 36.4 C  SpO2: 95% 95%    Last Pain:  Vitals:   11/13/24 0747  TempSrc: Oral  PainSc: 0-No pain                 Katlyne Nishida S      "

## 2024-11-13 NOTE — Evaluation (Signed)
 Physical Therapy Evaluation Patient Details Name: Patrick [name] MRN: [id number] DOB: 24-Jan-1935 Today's Date: 11/13/2024  History of Present Illness  Patrick [name] is a 89 y.o. M adm 11/03/24 for sepsis d/t LLE cellulitis of LLE. 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 Pt s/p aortogram RLE runoff with laser atherectomy of the right anterior tibial artery and balloon angioplasty 1/5. Pt s/p right foot I&D with fifth metatarsal base resection, application of antibiotic beads with vancomycin  and tobramycin , and wound vac application 1/7. Plan for left BKA 1/9. PMHx: dementia, T2DM, BPH/urinary retention s/p suprapubic catheter, PVD, neuropathy, and pancreatic mass.   Clinical Impression  Pt admitted with above diagnosis. Examination limited by impaired cognition, pt being a poor historian, and lack of family/caregiver present. Unsure of PLOF or home set-up. Pt currently with functional limitations due to the deficits listed below (see PT Problem List). He required maxA to roll and totalA for bed mobility. Unable to assess BLE d/t lack of command following, impaired understanding, and pain. Pt sat EOB with modA d/t posterior and right lateral lean. Unable to attempt transfer d/t safety concerns with lack of command follow, absence of +2 assist available, and pt becoming increasingly restless intermittently swinging at PT/RN. Pt will benefit from acute skilled PT to increase his independence and safety with mobility to allow discharge. Recommend continued inpatient follow up therapy, <3 hours/day.    If plan is discharge home, recommend the following: Supervision due to cognitive status;Direct supervision/assist for medications management;Direct supervision/assist for financial management;Two people to help with walking and/or transfers;A lot of help with  bathing/dressing/bathroom;Assistance with cooking/housework;Assist for transportation;Help with stairs or ramp for entrance   Can travel by private vehicle   No    Equipment Recommendations Hoyer lift;Wheelchair (measurements PT);Wheelchair cushion (measurements PT);BSC/3in1  Recommendations for Other Services       Functional Status Assessment Patient has had a recent decline in their functional status and demonstrates the ability to make significant improvements in function in a reasonable and predictable amount of time.     Precautions / Restrictions Precautions Precautions: Fall Recall of Precautions/Restrictions: Impaired Required Braces or Orthoses: Other Brace Other Brace: R post-op shoe Restrictions Weight Bearing Restrictions Per Provider Order: Yes RLE Weight Bearing Per Provider Order: Weight bearing as tolerated (in post-op shoe)      Mobility  Bed Mobility Overal bed mobility: Needs Assistance Bed Mobility: Supine to Sit, Sit to Supine, Rolling Rolling: Max assist   Supine to sit: Total assist Sit to supine: Total assist   General bed mobility comments: Pt sat up on right side of bed with increased time. Less than 25% of active engagement/participation by pt. He primarily utilized BUE with guidance to bed rail. Pt intermittently swung at therapist becoming restless and possibly fearful or with increased pain. Returned pt to bed. Rolled R/L to fix bed pad. Repositioned with use of bed pad, bed features, and +2 assist.    Transfers                   General transfer comment: Deferred d/t safety, lack of +2 assist available, and pt becoming restless intermittently swinging at therapist.    Ambulation/Gait               General Gait Details: Unable  Stairs            Wheelchair Mobility     Tilt Bed  Modified Rankin (Stroke Patients Only)       Balance Overall balance assessment: Needs assistance Sitting-balance support: Feet  supported, Bilateral upper extremity supported Sitting balance-Leahy Scale: Poor Sitting balance - Comments: Pt sat EOB with modA to maintain upright posture. He demonstrated a right and posterior lean. Unsure if he was attempting to lay back down in bed. Postural control: Posterior lean, Right lateral lean                                   Pertinent Vitals/Pain Pain Assessment Pain Assessment: PAINAD Breathing: occasional labored breathing, short period of hyperventilation Negative Vocalization: occasional moan/groan, low speech, negative/disapproving quality Facial Expression: sad, frightened, frown Body Language: rigid, fists clenched, knees up, pushing/pulling away, strikes out Consolability: distracted or reassured by voice/touch PAINAD Score: 6 Pain Intervention(s): Monitored during session    Home Living Family/patient expects to be discharged to:: Unsure Living Arrangements: Alone   Type of Home: House             Additional Comments: Pt very confused, unable to answer questions, or articulate any information.    Prior Function Prior Level of Function : Patient poor historian/Family not available               ADLs Comments: Patient unable to articulate prior level for ADL,iADL, meals, driving, medication management.     Extremity/Trunk Assessment   Upper Extremity Assessment Upper Extremity Assessment: Defer to OT evaluation    Lower Extremity Assessment Lower Extremity Assessment: Difficult to assess due to impaired cognition;RLE deficits/detail;LLE deficits/detail RLE Deficits / Details: Pt with limited active movement. Unable to assess strength d/t lack of command following or understanding of instructions. RLE: Unable to fully assess due to pain RLE Sensation: history of peripheral neuropathy RLE Coordination:  (Unable to assess - pt not understanding instructions.) LLE Deficits / Details: Pt with limited active movement. Unable to  assess strength d/t lack of command following or understanding of instructions. LLE: Unable to fully assess due to pain LLE Sensation: history of peripheral neuropathy LLE Coordination:  (Unable to assess - pt not understanding instructions.)    Cervical / Trunk Assessment Cervical / Trunk Assessment: Kyphotic  Communication   Communication Communication: Impaired Factors Affecting Communication: Reduced clarity of speech    Cognition Arousal: Lethargic, Alert Behavior During Therapy: Flat affect, Lability, Restless   PT - Cognitive impairments: History of cognitive impairments, No family/caregiver present to determine baseline, Orientation (Dementia)   Orientation impairments: Place, Time, Situation (Pt only able to state his name and DOB)                   PT - Cognition Comments: Pt initially drowsy, greeted asleep in bed. He demonstrated emotional lability frequently becoming teary. Alertness improved upon sitting EOB; however, pt became restless swinging BLE at thearpist/RN. Minimal to no command following noted. Pt with nonsensical speech. Following commands: Impaired Following commands impaired: Follows one step commands inconsistently     Cueing Cueing Techniques: Verbal cues, Gestural cues, Tactile cues     General Comments General comments (skin integrity, edema, etc.): RN entered towards end of session to help reposition pt.    Exercises     Assessment/Plan    PT Assessment Patient needs continued PT services  PT Problem List Decreased strength;Decreased activity tolerance;Decreased balance;Decreased mobility;Decreased knowledge of use of DME;Decreased safety awareness;Decreased knowledge of precautions;Decreased skin integrity;Pain  PT Treatment Interventions DME instruction;Gait training;Functional mobility training;Therapeutic activities;Therapeutic exercise;Balance training;Cognitive remediation;Patient/family education;Wheelchair mobility training     PT Goals (Current goals can be found in the Care Plan section)  Acute Rehab PT Goals PT Goal Formulation: Patient unable to participate in goal setting Time For Goal Achievement: 11/27/24 Potential to Achieve Goals: Fair    Frequency Min 2X/week     Co-evaluation               AM-PAC PT 6 Clicks Mobility  Outcome Measure Help needed turning from your back to your side while in a flat bed without using bedrails?: A Lot Help needed moving from lying on your back to sitting on the side of a flat bed without using bedrails?: Total Help needed moving to and from a bed to a chair (including a wheelchair)?: Total Help needed standing up from a chair using your arms (e.g., wheelchair or bedside chair)?: Total Help needed to walk in hospital room?: Total Help needed climbing 3-5 steps with a railing? : Total 6 Click Score: 7    End of Session   Activity Tolerance: Patient limited by pain Patient left: in bed;with call bell/phone within reach;with bed alarm set Nurse Communication: Mobility status;Other (comment) (Pt's change in behavior, increased confusion, potential worsening pain) PT Visit Diagnosis: Pain;Difficulty in walking, not elsewhere classified (R26.2);Other abnormalities of gait and mobility (R26.89);Unsteadiness on feet (R26.81) Pain - Right/Left: Right Pain - part of body: Ankle and joints of foot    Time: 8854-8794 PT Time Calculation (min) (ACUTE ONLY): 20 min   Charges:   PT Evaluation $PT Eval Moderate Complexity: 1 Mod   PT General Charges $$ ACUTE PT VISIT: 1 Visit         Randall SAUNDERS, PT, DPT Acute Rehabilitation Services Office: [phone] Secure Chat Preferred  Delon CHRISTELLA Callander 11/13/2024, 1:38 PM

## 2024-11-13 NOTE — Progress Notes (Signed)
" °  °  He is tentatively scheduled for left below-knee amputation tomorrow late morning however I have been unable to contact his son to obtain consent this will need to be done prior to proceeding.  Cartrell Bentsen C. Sheree, MD [facility] [place] Office: [phone] Pager: [phone]  "

## 2024-11-13 NOTE — Progress Notes (Signed)
"  °  Subjective:  Patient ID: Patrick [name], male    DOB: 15-Nov-1934,  MRN: [id number]  No chief complaint on file.   DOS: 11/12/2024 Procedure: 1.  Irrigation and debridement with fifth metatarsal base resection, right foot 2.  Application antibiotic beads with vancomycin  and tobramycin , right foot 3.  Application negative pressure wound therapy 4 x 3.5 x 2 cm, right foot  89 y.o. male seen for post op check.  Patient seen this morning having some pain in the right foot.  Discussed that we will plan to put the wound VAC on probably tomorrow.  He had no other questions.  All concerns addressed and I asked the nurse to see if we can get him some pain medication  Review of Systems: Negative except as noted in the HPI. Denies N/V/F/Ch.   Objective:   Constitutional Well developed. Well nourished.  Vascular Foot warm and well perfused. Capillary refill normal to all digits.   No calf pain with palpation  Neurologic Normal speech. Oriented to person, place, and time. Epicritic sensation intact to right foot  Dermatologic Compression bandage clean dry and intact to right foot no bleeding or strikethrough noted  Orthopedic: Status post right partial fifth metatarsal base resection with antibiotic beads   Radiographs: 1. Antibiotic beads at the resected fifth metatarsal base with a vacuum wound apparatus in place. 2. Chronic arterial calcifications.   Pathology: Pending  Micro: Rare GPC's pending  Assessment:   1. SIRS (systemic inflammatory response syndrome) (HCC)   2. 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  POD # 1 s/p partial fifth metatarsal base resection antibiotic beads and wound VAC application -Progressing well this morning still working on pain control on the right  side. -VAC had to be discontinued yesterday due to venous bleeding and clotting in the VAC tubing.  Will plan to consult wound ostomy care nurse to reapply wound VAC bedside tomorrow, I still believe he will benefit from long-term VAC therapy on the right foot -Plan for BKA on the left lower extremity timing per vascular  -WB Status: Weightbearing as tolerated in postop shoe on the right -Sutures: None present. -Medications/ABX: Hold for clean margin at the fifth metatarsal base resection site though would defer antibiotic therapy to infectious disease given his bilateral infection and risk profile -Dressing: Leave compression dressing clean dry and intact today plan for wound VAC application tomorrow - F/u Plan: Will continue to follow        Patrick [name], DPM [facility]  "

## 2024-11-13 NOTE — Evaluation (Signed)
 Occupational Therapy Evaluation Patient Details Name: Patrick [name] MRN: [id number] DOB: Oct 30, 1935 Today's Date: 11/13/2024   History of Present Illness   89 y.o. M adm 11/03/24 for sepsis d/t LLE cellulitis of LLE. 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 Pt s/p aortogram RLE runoff with laser atherectomy of the right anterior tibial artery and balloon angioplasty 1/5. Pt s/p right foot I&D with fifth metatarsal base resection, application of antibiotic beads with vancomycin  and tobramycin , and wound vac application 1/7. Plan for left BKA with timing to be determined by Vascular. PMHx: dementia, T2DM, BPH/urinary retention s/p suprapubic catheter, PVD, neuropathy, and pancreatic mass.     Clinical Impressions Cleared for dangle on the edge of bed via RN.  Patient unable to achieve full sitting due to pain in both feet/ankles.  Patient very confused, speech unintelligible, and unable to give any prior level of function, or details of his home environment.  Patient did state he lived alone.  Patient left in bed, bed alarm screen not working, RN notified.  OT can continue efforts in the acute setting to address deficits, and Patient will benefit from continued inpatient follow up therapy, <3 hours/day.     If plan is discharge home, recommend the following:   Assistance with cooking/housework;Assist for transportation;A lot of help with bathing/dressing/bathroom;Two people to help with walking and/or transfers;Supervision due to cognitive status     Functional Status Assessment   Patient has had a recent decline in their functional status and demonstrates the ability to make significant improvements in function in a reasonable and predictable amount of time.     Equipment Recommendations   None recommended by OT     Recommendations for Other Services          Precautions/Restrictions   Precautions Precautions: Fall Recall of Precautions/Restrictions: Impaired Precaution/Restrictions Comments: Surgical shoe in the room, bedrest, but cleared to dangle via [employer]. Restrictions Weight Bearing Restrictions Per Provider Order: No     Mobility Bed Mobility Overal bed mobility: Needs Assistance Bed Mobility: Supine to Sit, Sit to Supine     Supine to sit: Total assist     General bed mobility comments: unable to get fully to sitting due to pain    Transfers                   General transfer comment: NT      Balance Overall balance assessment: Needs assistance Sitting-balance support: Feet supported, Bilateral upper extremity supported Sitting balance-Leahy Scale: Poor   Postural control: Posterior lean, Right lateral lean                                 ADL either performed or assessed with clinical judgement   ADL Overall ADL's : Needs assistance/impaired Eating/Feeding: Minimal assistance;Bed level   Grooming: Wash/dry hands;Wash/dry face;Supervision/safety;Bed level   Upper Body Bathing: Moderate assistance;Bed level   Lower Body Bathing: Total assistance;Bed level;Maximal assistance   Upper Body Dressing : Moderate assistance;Bed level   Lower Body Dressing: Maximal assistance;Total assistance;Bed level     Toilet Transfer Details (indicate cue type and reason): unable Toileting- Clothing Manipulation and Hygiene: Total assistance;Bed level               Vision   Vision Assessment?: No apparent visual deficits     Perception Perception: Not tested  Praxis Praxis: Not tested       Pertinent Vitals/Pain Pain Assessment Pain Assessment: Faces Faces Pain Scale: Hurts whole lot Pain Location: B feet Pain Descriptors / Indicators: Crying, Grimacing, Guarding Pain Intervention(s): Monitored during session     Extremity/Trunk Assessment Upper Extremity Assessment Upper  Extremity Assessment: Generalized weakness   Lower Extremity Assessment Lower Extremity Assessment: Defer to PT evaluation   Cervical / Trunk Assessment Cervical / Trunk Assessment: Kyphotic   Communication Communication Communication: Impaired Factors Affecting Communication: Reduced clarity of speech   Cognition Arousal: Alert Behavior During Therapy: Flat affect Cognition: Cognition impaired   Orientation impairments: Place, Time, Situation Awareness: Intellectual awareness impaired, Online awareness impaired Memory impairment (select all impairments): Short-term memory, Working memory Attention impairment (select first level of impairment): Focused attention Executive functioning impairment (select all impairments): Initiation, Organization, Sequencing, Reasoning, Problem solving                   Following commands: Impaired Following commands impaired: Follows one step commands inconsistently     Cueing  General Comments   Cueing Techniques: Verbal cues;Tactile cues   VSS    Exercises     Shoulder Instructions      Home Living Family/patient expects to be discharged to:: Private residence Living Arrangements: Alone   Type of Home: House                           Additional Comments: Patient very confused, unable to answer any home setup questions - ? memory impairment.      Prior Functioning/Environment                 ADLs Comments: Patient unable to articulate prior level for ADL,iADL, meals, driving, medication management.    OT Problem List: Decreased strength;Decreased range of motion;Decreased activity tolerance;Impaired balance (sitting and/or standing);Decreased safety awareness;Decreased cognition;Pain   OT Treatment/Interventions: Self-care/ADL training;Therapeutic activities;Balance training;Cognitive remediation/compensation;Patient/family education      OT Goals(Current goals can be found in the care plan section)    Acute Rehab OT Goals Patient Stated Goal: None stated OT Goal Formulation: Patient unable to participate in goal setting Time For Goal Achievement: 11/27/24 Potential to Achieve Goals: Fair ADL Goals Pt Will Perform Grooming: with supervision;sitting Pt Will Perform Upper Body Bathing: with supervision;sitting Pt Will Perform Lower Body Bathing: with min assist;sitting/lateral leans Pt Will Perform Upper Body Dressing: with supervision;sitting Pt Will Perform Lower Body Dressing: with min assist;sitting/lateral leans Pt Will Transfer to Toilet: with mod assist;stand pivot transfer;bedside commode   OT Frequency:  Min 2X/week    Co-evaluation              AM-PAC OT 6 Clicks Daily Activity     Outcome Measure Help from another person eating meals?: A Little Help from another person taking care of personal grooming?: A Little Help from another person toileting, which includes using toliet, bedpan, or urinal?: A Lot Help from another person bathing (including washing, rinsing, drying)?: A Lot Help from another person to put on and taking off regular upper body clothing?: A Lot Help from another person to put on and taking off regular lower body clothing?: Total 6 Click Score: 13   End of Session Nurse Communication: Other (comment) (Bed alarm screen not working on either side to set bed alarm)  Activity Tolerance: Patient limited by pain Patient left: in bed;with call bell/phone within reach  OT Visit Diagnosis: Unsteadiness on feet (R26.81);Muscle weakness (  generalized) (M62.81);Other symptoms and signs involving cognitive function;Pain Pain - part of body: Ankle and joints of foot                Time: 9062-9049 OT Time Calculation (min): 13 min Charges:  OT General Charges $OT Visit: 1 Visit OT Evaluation $OT Eval Moderate Complexity: 1 Mod  11/13/2024  RP, OTR/L  Acute Rehabilitation Services  Office:  [phone]   Charlie JONETTA Halsted 11/13/2024, 10:35 AM

## 2024-11-14 ENCOUNTER — Encounter (HOSPITAL_COMMUNITY): Admission: EM | Disposition: A | Payer: Self-pay | Source: Home / Self Care | Attending: Family Medicine

## 2024-11-14 DIAGNOSIS — K869 Disease of pancreas, unspecified: Secondary | ICD-10-CM | POA: Diagnosis not present

## 2024-11-14 DIAGNOSIS — F0390 Unspecified dementia without behavioral disturbance: Secondary | ICD-10-CM

## 2024-11-14 DIAGNOSIS — E8809 Other disorders of plasma-protein metabolism, not elsewhere classified: Secondary | ICD-10-CM | POA: Diagnosis not present

## 2024-11-14 DIAGNOSIS — L0390 Cellulitis, unspecified: Secondary | ICD-10-CM | POA: Diagnosis not present

## 2024-11-14 DIAGNOSIS — E1169 Type 2 diabetes mellitus with other specified complication: Secondary | ICD-10-CM | POA: Diagnosis not present

## 2024-11-14 DIAGNOSIS — I739 Peripheral vascular disease, unspecified: Secondary | ICD-10-CM

## 2024-11-14 DIAGNOSIS — L089 Local infection of the skin and subcutaneous tissue, unspecified: Secondary | ICD-10-CM

## 2024-11-14 DIAGNOSIS — F03918 Unspecified dementia, unspecified severity, with other behavioral disturbance: Secondary | ICD-10-CM | POA: Diagnosis not present

## 2024-11-14 DIAGNOSIS — E871 Hypo-osmolality and hyponatremia: Secondary | ICD-10-CM | POA: Diagnosis not present

## 2024-11-14 LAB — CBC WITH DIFFERENTIAL/PLATELET
Abs Immature Granulocytes: 0.19 K/uL — ABNORMAL HIGH (ref 0.00–0.07)
Basophils Absolute: 0.1 K/uL (ref 0.0–0.1)
Basophils Relative: 1 %
Eosinophils Absolute: 0.2 K/uL (ref 0.0–0.5)
Eosinophils Relative: 1 %
HCT: 34.7 % — ABNORMAL LOW (ref 39.0–52.0)
Hemoglobin: 11.9 g/dL — ABNORMAL LOW (ref 13.0–17.0)
Immature Granulocytes: 1 %
Lymphocytes Relative: 40 %
Lymphs Abs: 7.4 K/uL — ABNORMAL HIGH (ref 0.7–4.0)
MCH: 27.3 pg (ref 26.0–34.0)
MCHC: 34.3 g/dL (ref 30.0–36.0)
MCV: 79.6 fL — ABNORMAL LOW (ref 80.0–100.0)
Monocytes Absolute: 1.3 K/uL — ABNORMAL HIGH (ref 0.1–1.0)
Monocytes Relative: 7 %
Neutro Abs: 9.2 K/uL — ABNORMAL HIGH (ref 1.7–7.7)
Neutrophils Relative %: 50 %
Platelets: 556 K/uL — ABNORMAL HIGH (ref 150–400)
RBC: 4.36 MIL/uL (ref 4.22–5.81)
RDW: 15.3 % (ref 11.5–15.5)
WBC: 18.4 K/uL — ABNORMAL HIGH (ref 4.0–10.5)
nRBC: 0 % (ref 0.0–0.2)

## 2024-11-14 LAB — GLUCOSE, CAPILLARY
Glucose-Capillary: 120 mg/dL — ABNORMAL HIGH (ref 70–99)
Glucose-Capillary: 108 mg/dL — ABNORMAL HIGH (ref 70–99)
Glucose-Capillary: 98 mg/dL (ref 70–99)
Glucose-Capillary: 97 mg/dL (ref 70–99)

## 2024-11-14 LAB — COMPREHENSIVE METABOLIC PANEL WITH GFR
ALT: 12 U/L (ref 0–44)
AST: 24 U/L (ref 15–41)
Albumin: 3.6 g/dL (ref 3.5–5.0)
Alkaline Phosphatase: 66 U/L (ref 38–126)
Anion gap: 11 (ref 5–15)
BUN: 21 mg/dL (ref 8–23)
CO2: 24 mmol/L (ref 22–32)
Calcium: 9.4 mg/dL (ref 8.9–10.3)
Chloride: 96 mmol/L — ABNORMAL LOW (ref 98–111)
Creatinine, Ser: 0.99 mg/dL (ref 0.61–1.24)
GFR, Estimated: >60 mL/min
Glucose, Bld: 114 mg/dL — ABNORMAL HIGH (ref 70–99)
Potassium: 5.1 mmol/L (ref 3.5–5.1)
Sodium: 131 mmol/L — ABNORMAL LOW (ref 135–145)
Total Bilirubin: 0.5 mg/dL (ref 0.0–1.2)
Total Protein: 7.9 g/dL (ref 6.5–8.1)

## 2024-11-14 LAB — SURGICAL PATHOLOGY

## 2024-11-14 LAB — MAGNESIUM: Magnesium: 2.1 mg/dL (ref 1.7–2.4)

## 2024-11-14 LAB — PHOSPHORUS: Phosphorus: 2.4 mg/dL — ABNORMAL LOW (ref 2.5–4.6)

## 2024-11-14 SURGERY — AMPUTATION BELOW KNEE
Anesthesia: General | Site: Knee | Laterality: Left

## 2024-11-14 MED ORDER — ENOXAPARIN SODIUM 40 MG/0.4ML IJ SOSY
40.0000 mg | PREFILLED_SYRINGE | INTRAMUSCULAR | Status: DC
  Administered 2024-11-15 – 2024-11-19 (×5): 40 mg via SUBCUTANEOUS
  Filled 2024-11-14 (×5): qty 0.4

## 2024-11-14 MED ORDER — AMOXICILLIN-POT CLAVULANATE 875-125 MG PO TABS
1.0000 | ORAL_TABLET | Freq: Two times a day (BID) | ORAL | Status: DC
  Administered 2024-11-15 – 2024-11-19 (×8): 1 via ORAL
  Filled 2024-11-14 (×8): qty 1

## 2024-11-14 MED ORDER — ASPIRIN 81 MG PO TBEC
81.0000 mg | DELAYED_RELEASE_TABLET | Freq: Every day | ORAL | Status: DC
  Administered 2024-11-14 – 2024-11-19 (×5): 81 mg via ORAL
  Filled 2024-11-14 (×5): qty 1

## 2024-11-14 MED ORDER — DOXYCYCLINE HYCLATE 100 MG PO TABS
100.0000 mg | ORAL_TABLET | Freq: Two times a day (BID) | ORAL | Status: DC
  Administered 2024-11-15 – 2024-11-19 (×8): 100 mg via ORAL
  Filled 2024-11-14 (×8): qty 1

## 2024-11-14 MED ORDER — CYCLOBENZAPRINE HCL 10 MG PO TABS: 5.0000 mg | ORAL_TABLET | Freq: Three times a day (TID) | ORAL | Status: DC | PRN

## 2024-11-14 NOTE — Progress Notes (Signed)
 " PROGRESS NOTE    Patrick [name]  FMW:[id number] DOB: 05/23/1935 DOA: 11/03/2024 PCP: Maree Leni Edyth DELENA, MD   Brief Narrative:  Patrick [name] is a 89 y.o. male with a history of dementia, diabetes mellitus type 2, BPH/urinary retention s/p suprapubic catheter, pancreatic mass.  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 Vascular surgery and podiatry consulted. Empiric IV antibiotics started. Patient underwent angiogram with left dorsalis pedis and left anterior tibial angioplasty.  He is now status post I&D with fifth metatarsal base resection of the right foot.  Current plan is for left lower extremity BKA and he has tentatively scheduled for Left BKA late AM on 11/14/24 however family did not consent to amputation.  ID has not been consulted and they are recommending palliative care consultation and changing IV antibiotics to oral doxycycline  and Augmentin  and plan for 4 weeks from 1/7 to 12/10/2024 with ID follow-up on 2026.    Vascular recommends outpatient follow-up as well for further evaluation.  Palliative was reconsulted for further goals of care discussion.  Wound VAC was placed and the podiatry team recommends that this need to be changed twice weekly with VAC therapy for 1 to 3 months.  Assessment and Plan:  Sepsis: Present on admission. Secondary to foot infection and Osteomyelitis. Blood cultures obtained and patient started empirically on antibiotics. Blood cultures no growth x5 days. Continue Antibiotics as below being changed   Left Foot Abscess/Osteomyelitis/Myositis: Noted on MRI. Podiatry and vascular surgery consulted on admission. Empiric Linezolid , Ceftriaxone  and Unasyn  started with transition to linezolid , cefepime  and metronidazole . Podiatry recommending leg amputation (AKA vs BKA). Vascular surgery aware and will work on timing of amputation of LLE.   -Continued Linezolid , cefepime  and metronidazole  until surgery however will discontinue and changed to oral doxycycline  and Augmentin  per ID recommendations. Vascular Surgery to follow up 11/13/24 about the timing of BKA however family has declined BKA so will not pursue this. WBC Trend:  Recent Labs  Lab 11/04/24 1207 11/06/24 0758 11/08/24 0333 11/09/24 1558 11/10/24 0342 11/12/24 0518 11/13/24 0204  WBC 23.1* 23.1* 16.4* 16.4* 14.5* 18.7* 17.6*  -ID consulted and recommend no changes for antibiotics and I 4 weeks of treatment and recommending palliative involvement given that he needs source control  Right Foot Fifth Metatarsal Osteomyelitis s/p POD2 Partial fifth metatarsal base resection antibiotic beads and wound VAC application: Noted on MRI. Podiatry with plan for 5th ray resection I&D and he is postoperative day 0 with antibiotic bead application of vancomycin  tobramycin  as well as application of negative pressure wound.  However negative pressure wound did not work properly and started getting blockages and there is oozing noted from the surgical site.  The VAC was discontinued and he is now switched to saline wet-to-dry packing with wound and dry gauze 4 x 4 ABD pad Kerlix Ace wrapped with compression.  The podiatry team feels that the wound VAC is likely be reapplied later this week by the WOCN in the room consulting for wound VAC change on Friday for application. Continue Antibiotics w/ po Linezolid  and po Metronidazole  as well as IV Cefepime .  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 ABIs performed on 1/1  and are significant for moderate bilateral disease. Patient underwent angiogram on 1/2, with vascular performing left  dorsalis pedis and left anterior tibial angioplasty. Patient started on Aspirin  325 mg po Daily and Clopidogrel  75 mg po Daily. Right lower extremity angiogram performed on 1/5 with laser arthrectomy of right anterior tibial artery and balloon angioplasty. Patient underwent Surgery as above on the R - Podiatry followed up again and applied wound VAC and recommending wound VAC for 1 to 3 months with changing twice weekly. - Palliative care consulted for consulted for further goals of care discussion  Hyponatremia: Na+ Trend:  Recent Labs  Lab 11/03/24 1940 11/04/24 2030 11/06/24 0758 11/09/24 1558 11/10/24 0342 11/12/24 0625 11/13/24 0204  NA 133* 134* 135 131* 134* 132* 131*  -Fluctuating. CTM and Trend and repeat CMP in the AM   Diabetes Mellitus Type 2: Well controlled based on hemoglobin A1C of 6.5%. Continue Very Sensitive Novolog  SSI AC/HS. CTM CBGs per Protocol. CBG Trend:  Recent Labs  Lab 11/12/24 1627 11/12/24 2130 11/13/24 0746 11/13/24 1139 11/13/24 1657 11/13/24 2059 11/14/24 0824  GLUCAP 99 145* 119* 110* 112* 166* 120*   BPH / Urinary Retention: Patient is s/p history of suprapubic catheter placement. Patient follows with Urology as an outpatient.  HypoNatremia: Na+ Trend:  Recent Labs  Lab 11/03/24 1940 11/04/24 2030 11/06/24 0758 11/09/24 1558 11/10/24 0342 11/12/24 0625 11/13/24 0204  NA 133* 134* 135 131* 134* 132* 131*  -CTM and Trend and Repeat CMP in the AM    Dementia: Appears to be mild, however patient developed some evidence of delirium on 1/3. Patient is already on Risperdal . Patient with hyperactive and hypoactive delirium. CT head unremarkable for acute process. Unclear if episodes are related to Risperidone  so this was discontinued. C/w Delirium precautions and started Atarax  25 mg TIDprn Anxiety   Pancreatic Mass: Had a CT abdomen pelvis urogram with and without IV contrast back in May 2025 at [facility] which showed a complex lesion involving  the pancreatic body measuring upwards of 1.7 x 1.3 x 1.7 cm.  This was recommended to be further eval with pancreatic protocol MRI or EUS.  He is referred to GI and unclear if he ever followed up on this.  Palliative care has been consulted for further goals of care discussion  Microcytic Anemia: Hgb/Hct Trend:  Recent Labs  Lab 11/04/24 1207 11/06/24 0758 11/08/24 0333 11/09/24 1558 11/10/24 0342 11/12/24 0518 11/13/24 0204  HGB 13.4 11.7* 10.9* 11.6* 11.4* 11.0* 10.3*  HCT 39.8 34.3* 31.7* 34.1* 32.8* 31.2* 29.5*  MCV 80.9 80.3 79.8* 80.4 79.6* 77.6* 79.1*  -Checked Anemia Panel and showed an iron level of 35, UIBC 197, TIBC of 232, saturation ratios of 50%, ferritin level of 107 prolactin level of 9.1 and vitamin B12 584. CTM for S/Sx of Bleeding. No overt bleeding noted. Repeat CBC in the AM  Thrombocytosis: Improving. In the setting of Infection. Current Plt Count Trend:  Recent Labs  Lab 11/04/24 1207 11/06/24 0758 11/08/24 0333 11/09/24 1558 11/10/24 0342 11/12/24 0518 11/13/24 0204  PLT 717* 653* 668* 575* 658* 554* 530*  -CTM and Trend and repeat CBC in the AM   Hypoalbuminemia: Patient's Albumin Lvl Trend went from 3.9 x2 -> 3.5 x2 -> 3.4. CTM & Trend & Repeat CMP in the AM  Overweight: Complicates overall prognosis and care. Estimated body mass index is 27.35 kg/m as calculated from the following:   Height as of this encounter: 5' 8 (1.727 m).   Weight as  of this encounter: 81.6 kg. Weight Loss and Dietary Counseling given   DVT prophylaxis: SCDs; Will resume Enoxaparin  40 mg sq    Code Status: Full Code Family Communication: No family present @ bedside   Disposition Plan:  Level of care: Med-Surg Status is: Inpatient Remains inpatient appropriate because: Needs further clinical improvement and goals of care discussion   Consultants:  Podiatry Vascular Surgery Palliative Care Medicine  Procedures:  As delineated as above  Antimicrobials:   Anti-infectives (From admission, onward)    Start     Dose/Rate Route Frequency Ordered Stop   11/14/24 2200  doxycycline  (VIBRA -TABS) tablet 100 mg        100 mg Oral Every 12 hours 11/14/24 1606 12/11/24 0959   11/14/24 2200  amoxicillin -clavulanate (AUGMENTIN ) 875-125 MG per tablet 1 tablet        1 tablet Oral Every 12 hours 11/14/24 1606 12/11/24 0959   11/12/24 1102  tobramycin  (NEBCIN ) injection  Status:  Discontinued          As needed 11/12/24 1133 11/12/24 1134   11/12/24 1043  vancomycin  (VANCOCIN ) powder  Status:  Discontinued          As needed 11/12/24 1043 11/12/24 1115   11/11/24 2200  metroNIDAZOLE  (FLAGYL ) tablet 500 mg  Status:  Discontinued        500 mg Oral Every 12 hours 11/11/24 0943 11/14/24 1345   11/05/24 2200  linezolid  (ZYVOX ) tablet 600 mg  Status:  Discontinued       Placed in And Linked Group   600 mg Oral Every 12 hours 11/05/24 1341 11/14/24 1606   11/05/24 1445  ceFEPIme  (MAXIPIME ) 2 g in sodium chloride  0.9 % 100 mL IVPB  Status:  Discontinued        2 g 200 mL/hr over 30 Minutes Intravenous Every 12 hours 11/05/24 1350 11/14/24 1606   11/05/24 1445  metroNIDAZOLE  (FLAGYL ) IVPB 500 mg  Status:  Discontinued        500 mg 100 mL/hr over 60 Minutes Intravenous Every 12 hours 11/05/24 1352 11/11/24 0943   11/04/24 1430  Ampicillin -Sulbactam (UNASYN ) 3 g in sodium chloride  0.9 % 100 mL IVPB  Status:  Discontinued        3 g 200 mL/hr over 30 Minutes Intravenous Every 6 hours 11/04/24 1405 11/05/24 1340   11/04/24 1400  linezolid  (ZYVOX ) tablet 600 mg  Status:  Discontinued       Placed in And Linked Group   600 mg Oral Every 12 hours 11/04/24 1355 11/05/24 1341   11/04/24 1200  cefTRIAXone  (ROCEPHIN ) 2 g in sodium chloride  0.9 % 100 mL IVPB        2 g 200 mL/hr over 30 Minutes Intravenous  Once 11/04/24 1157 11/04/24 1241       Subjective: Seen and examined at bedside he was resting and had just gotten some pain medication so he was asleep.   Nursing states that he had a difficult time with his VAC change.  Family had refused surgical intervention so this was canceled.  Palliative consulted for further goals of care discussion and awaiting discussion.  Objective: Vitals:   11/14/24 0231 11/14/24 0822 11/14/24 1729 11/14/24 2015  BP: 127/88 (!) 141/81 118/76 120/72  Pulse: 79 71 70 69  Resp: 18     Temp: 97.9 F (36.6 C) 98.4 F (36.9 C)  97.6 F (36.4 C)  TempSrc:    Oral  SpO2: 100% 90% 100% 100%  Weight:  Height:        Intake/Output Summary (Last 24 hours) at 11/14/2024 2059 Last data filed at 11/14/2024 1817 Gross per 24 hour  Intake 200 ml  Output 1050 ml  Net -850 ml   Filed Weights   11/04/24 1400 11/12/24 0930  Weight: 81.6 kg 81.6 kg   Examination: Physical Exam:  Constitutional: Overweight elderly chronically ill-appearing African-American male who is resting Respiratory: Diminished to auscultation bilaterally, no wheezing, rales, rhonchi or crackles. Normal respiratory effort and patient is not tachypenic. No accessory muscle use.  Unlabored breathing Cardiovascular: RRR, no murmurs / rubs / gallops. S1 and S2 auscultated. No extremity edema.  Abdomen: Soft, non-tender, non-distended. Bowel sounds positive.  GU: Deferred. Musculoskeletal: Right leg is wrapped connected to wound VAC Skin: Left-sided calcaneal ulcer with covered Neurologic: Somnolent and drowsy and asleep Psychiatric: Appears calm  Data Reviewed: I have personally reviewed following labs and imaging studies  CBC: Recent Labs  Lab 11/09/24 1558 11/10/24 0342 11/12/24 0518 11/13/24 0204 11/14/24 1320  WBC 16.4* 14.5* 18.7* 17.6* 18.4*  NEUTROABS  --   --   --  8.8* 9.2*  HGB 11.6* 11.4* 11.0* 10.3* 11.9*  HCT 34.1* 32.8* 31.2* 29.5* 34.7*  MCV 80.4 79.6* 77.6* 79.1* 79.6*  PLT 575* 658* 554* 530* 556*   Basic Metabolic Panel: Recent Labs  Lab 11/09/24 1558 11/10/24 0342 11/12/24 0625 11/13/24 0204 11/14/24 1320  NA  131* 134* 132* 131* 131*  K 4.6 4.4 4.6 4.3 5.1  CL 98 100 97* 98 96*  CO2 23 22 25 26 24   GLUCOSE 93 82 172* 135* 114*  BUN 16 17 26* 23 21  CREATININE 1.05 0.98 1.17 1.08 0.99  CALCIUM  9.0 8.8* 9.5 9.5 9.4  MG  --   --   --  2.1 2.1  PHOS  --   --   --  2.7 2.4*   GFR: Estimated Creatinine Clearance: 50.9 mL/min (by C-G formula based on SCr of 0.99 mg/dL). Liver Function Tests: Recent Labs  Lab 11/09/24 1558 11/13/24 0204 11/14/24 1320  AST 34 19 24  ALT 12 10 12   ALKPHOS 70 57 66  BILITOT 0.3 0.3 0.5  PROT 7.8 7.1 7.9  ALBUMIN 3.5 3.4* 3.6   No results for input(s): LIPASE, AMYLASE in the last 168 hours. Recent Labs  Lab 11/09/24 1558  AMMONIA 20   Coagulation Profile: No results for input(s): INR, PROTIME in the last 168 hours. Cardiac Enzymes: No results for input(s): CKTOTAL, CKMB, CKMBINDEX, TROPONINI in the last 168 hours. BNP (last 3 results) No results for input(s): PROBNP in the last 8760 hours. HbA1C: No results for input(s): HGBA1C in the last 72 hours. CBG: Recent Labs  Lab 11/13/24 2059 11/14/24 0824 11/14/24 1243 11/14/24 1731 11/14/24 2046  GLUCAP 166* 120* 108* 98 97   Lipid Profile: No results for input(s): CHOL, HDL, LDLCALC, TRIG, CHOLHDL, LDLDIRECT in the last 72 hours. Thyroid Function Tests: No results for input(s): TSH, T4TOTAL, FREET4, T3FREE, THYROIDAB in the last 72 hours. Anemia Panel: Recent Labs    11/13/24 0204  VITAMINB12 584  FOLATE 9.1  FERRITIN 107  TIBC 232*  IRON 35*  RETICCTPCT 1.6   Sepsis Labs: No results for input(s): PROCALCITON, LATICACIDVEN in the last 168 hours.  Recent Results (from the past 240 hours)  Aerobic/Anaerobic Culture w Gram Stain (surgical/deep wound)     Status: None (Preliminary result)   Collection Time: 11/12/24 11:04 AM   Specimen: Foot, Right; Tissue  Result  Value Ref Range Status   Specimen Description TISSUE  Final   Special  Requests RIGHT FIFTH METATARSAL BASE RESECTION  Final   Gram Stain   Final    FEW WBC PRESENT,BOTH PMN AND MONONUCLEAR RARE GRAM POSITIVE COCCI    Culture   Final    CULTURE REINCUBATED FOR BETTER GROWTH Performed at [facility], [address], [place], [place] [address]    Report Status PENDING  Incomplete    Radiology Studies: No results found.  Scheduled Meds:  amoxicillin -clavulanate  1 tablet Oral Q12H   aspirin  EC  81 mg Oral Daily   clopidogrel   75 mg Oral Q breakfast   doxycycline   100 mg Oral Q12H   feeding supplement  237 mL Oral BID WC   insulin  aspart  0-5 Units Subcutaneous QHS   insulin  aspart  0-6 Units Subcutaneous TID WC   multivitamin with minerals  1 tablet Oral Daily   nutrition supplement (JUVEN)  1 packet Oral BID BM   rosuvastatin   20 mg Oral Daily   sodium chloride  flush  3 mL Intravenous Q12H   sodium chloride  flush  3 mL Intravenous Q12H   Continuous Infusions:   LOS: 10 days   Alejandro Marker, DO [facility] Available via Epic secure chat 7am-7pm After these hours, please refer to coverage provider listed on [email or web address] 11/14/2024, 8:59 PM  "

## 2024-11-14 NOTE — Consult Note (Signed)
 "        [facility]    Date of Admission:  11/03/2024     Reason for Consult: diabetic foot infection    Referring Provider: Sherrill     Lines:  Peripheral iv's  Abx: 12/30-c linezolid  12/31-1/6 metronidazole  12/31-c cefepime   Assessment: Diabetic foot infection Pancreatic mass Dementia Pad Dm2   S/p bilateral lower vascular intervention and I&D right foot 5th ray Left calcaneal om and abscess refused bka    Plan: With comorbidity agree with palliative care/comfort care transition if family agreeable Change iv abx to oral doxycycline  and amox/clav Plan 4 weeks from 1/7-12/10/24 Id clinic f/u on 2/19 @ 345 Maintain standard isolation precaution D/c when cleared by vascular/podiatry Will sign off     ------------------------------------------------ Principal Problem:   Sepsis due to cellulitis [facility]) Active Problems:   Thrombocytosis   Hyponatremia   Hyperproteinemia   Normocytic anemia   Dementia with behavioral disturbance (HCC)   Subacute osteomyelitis of right foot (HCC)   Pyogenic inflammation of bone (HCC)    HPI: Patrick [name] is a 89 y.o. male demented, dm2, chronic bilateral foot diabetic ulcer, supraputic cath, pancreatic mass, admitted 12/29 with bilateral foot pain/worsening swelling and id asked to comanage   Patient demented; hx via chart review Bcx negative on admission 1/7 operative right foot cx rare gpc  1/2 procedure 1) Ultrasound guided right common femoral artery access 2) Aortogram  3) Left lower extremity angiogram with second order cannulation  4) Additional left lower extremity angiogram with third order cannulation 5) Left dorsalis pedis angioplasty (3x149mm [place]) 6) Left anterior tibial angioplasty (3x172mm [place])  1/5 procedure 1.  Percutaneous ultrasound-guided cannulation left common femoral artery with Mynx device closure 2.  Catheter selection of aorta and aortogram and  right lower extremity angiography 3.  Laser athrectomy right anterior tibial artery with 1.9 mm [place] and plain balloon angioplasty with 3 mm coyote  1/7 procedure 1.  Irrigation and debridement with fifth metatarsal base resection, right foot 2.  Application antibiotic beads with vancomycin  and tobramycin , right foot 3.  Application negative pressure wound therapy 4 x 3.5 x 2 cm, right foot   History reviewed. No pertinent family history.  Social History[1]  Allergies[2]  Review of Systems: ROS All Other ROS was negative, except mentioned above   History reviewed. No pertinent past medical history.     Scheduled Meds:  aspirin  EC  81 mg Oral Daily   clopidogrel   75 mg Oral Q breakfast   feeding supplement  237 mL Oral BID WC   insulin  aspart  0-5 Units Subcutaneous QHS   insulin  aspart  0-6 Units Subcutaneous TID WC   linezolid   600 mg Oral Q12H   multivitamin with minerals  1 tablet Oral Daily   nutrition supplement (JUVEN)  1 packet Oral BID BM   rosuvastatin   20 mg Oral Daily   sodium chloride  flush  3 mL Intravenous Q12H   sodium chloride  flush  3 mL Intravenous Q12H   Continuous Infusions:  ceFEPime  (MAXIPIME ) IV Stopped (11/14/24 0345)   PRN Meds:.acetaminophen  **OR** acetaminophen , cyclobenzaprine , hydrALAZINE , hydrOXYzine , labetalol , LORazepam , ondansetron  **OR** ondansetron  (ZOFRAN ) IV, mouth rinse, oxyCODONE , oxyCODONE -acetaminophen , sodium chloride  flush, sodium chloride  flush   OBJECTIVE: Blood pressure (!) 141/81, pulse 71, temperature 98.4 F (36.9 C), resp. rate 18, height 5' 8 (1.727 m), weight 81.6 kg, SpO2 90%.  Physical Exam General/constitutional: arousable from sleeping HEENT: Normocephalic, PER, Conj Clear, EOMI, Oropharynx clear Neck supple CV:  rrr no mrg Lungs: clear to auscultation, normal respiratory effort Abd: Soft, Nontender Ext: no edema Skin: No Rash Neuro: sleepy MSK: right foot dressing c/d; left foot ulcer no  purulence/unstageable and necrotic eschar appearing    Lab Results Lab Results  Component Value Date   WBC 18.4 (H) 11/14/2024   HGB 11.9 (L) 11/14/2024   HCT 34.7 (L) 11/14/2024   MCV 79.6 (L) 11/14/2024   PLT 556 (H) 11/14/2024    Lab Results  Component Value Date   CREATININE 0.99 11/14/2024   BUN 21 11/14/2024   NA 131 (L) 11/14/2024   K 5.1 11/14/2024   CL 96 (L) 11/14/2024   CO2 24 11/14/2024    Lab Results  Component Value Date   ALT 12 11/14/2024   AST 24 11/14/2024   ALKPHOS 66 11/14/2024   BILITOT 0.5 11/14/2024      Microbiology: Recent Results (from the past 240 hours)  Blood culture (routine x 2)     Status: None   Collection Time: 11/04/24  8:30 PM   Specimen: BLOOD LEFT ARM  Result Value Ref Range Status   Specimen Description   Final    BLOOD LEFT ARM Performed at [facility], [address] [place], [place] [address]    Special Requests   Final    BOTTLES DRAWN AEROBIC AND ANAEROBIC Blood Culture adequate volume Performed at [facility], [address], [place], [place] [address]    Culture   Final    NO GROWTH 5 DAYS Performed at [facility], [address], [place], [place] [address]    Report Status 11/10/2024 FINAL  Final  Aerobic/Anaerobic Culture w Gram Stain (surgical/deep wound)     Status: None (Preliminary result)   Collection Time: 11/12/24 11:04 AM   Specimen: Foot, Right; Tissue  Result Value Ref Range Status   Specimen Description TISSUE  Final   Special Requests RIGHT FIFTH METATARSAL BASE RESECTION  Final   Gram Stain   Final    FEW WBC PRESENT,BOTH PMN AND MONONUCLEAR RARE GRAM POSITIVE COCCI    Culture   Final    CULTURE REINCUBATED FOR BETTER GROWTH Performed at [facility], [address] [place], [place] [address]    Report Status PENDING  Incomplete     Serology:    Imaging: If present, new imagings (plain films, ct scans, and mri) have been  personally visualized and interpreted; radiology reports have been reviewed. Decision making incorporated into the Impression / Recommendations.  12/31 mri left foot 1. Soft tissue wound extending along the medial heel. There is a 2.9 x 2.8 x 0.7 cm complex fluid collection most compatible with an abscess extending along the medial undersurface of the posterior calcaneus and the origin of the central cord of the plantar fascia. Possible early sinus tract formation to the overlying soft tissue wound at the medial heel. 2. 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 3. Increased signal of the intrinsic foot musculature may reflect chronic denervation changes, however, myositis can not be excluded. 4. Diffuse subcutaneous and soft tissue edema of the ankle. Subcutaneous edema extends along the dorsal hindfoot and midfoot.   12/31 mri right foot 1. Ulceration at the lateral midfoot overlying the fifth metatarsal base. Marrow edema of the underlying lateral half of the fifth metatarsal base could reflect early/mild osteomyelitis versus reactive marrow changes. 2. No fluid collection. 3. Increased signal of the intrinsic forefoot musculature may reflect chronic denervation  changes, however, myositis can not be excluded. 4. Subcutaneous edema of the dorsal forefoot.    Constance ONEIDA Passer, MD [facility] [phone] pager    11/14/2024, 3:50 PM     [1]  Social History Tobacco Use   Smoking status: Former    Types: Cigarettes   Smokeless tobacco: Former  Substance Use Topics   Alcohol use: Never   Drug use: Never  [2] No Known Allergies  "

## 2024-11-14 NOTE — Progress Notes (Signed)
 This nurse called materials for prevalon boots and was told they do not have any in stock now but are expecting a shipment of them today. I will call back a bit later

## 2024-11-14 NOTE — Progress Notes (Signed)
 "  Subjective:  Patient ID: Patrick [name], male    DOB: Jan 11, 1935,  MRN: [id number]  No chief complaint on file.   DOS: 11/12/2024 Procedure: 1.  Irrigation and debridement with fifth metatarsal base resection, right foot 2.  Application antibiotic beads with vancomycin  and tobramycin , right foot 3.  Application negative pressure wound therapy 4 x 3.5 x 2 cm, right foot  89 y.o. male seen for post op check.   Patient seen this morning for VAC change.  He was noted to have his right leg in a bent position at the knee with the lateral aspect of the right foot towards the bed.  Suspect this is how he developed the ulceration in the first place and is developing contracture in the right knee.  He has significant spasms and pain when trying to straighten the right side.  Discussed adding pain medications like Flexeril  and encouraged him to take pain meds as needed.  Discussed plan for [facility] therapy on the right.  Did not discuss the left side with him this morning.  Review of Systems: Negative except as noted in the HPI. Denies N/V/F/Ch.   Objective:   Constitutional Well developed. Well nourished.  Vascular Foot warm and well perfused. Capillary refill normal to all digits.   No calf pain with palpation  Neurologic Normal speech. Oriented to person, place, and time. Epicritic sensation intact to right foot  Dermatologic  right fifth metatarsal base surgical site with antibiotic beads present wound bed mostly granular red tissue present no evidence of purulence or infection.  Still with mild venous oozing at the distal margin after I remove the wet-to-dry dressing.  Overall looking very healthy no evidence of residual infection there.  Looks like it is viable and could improve with VAC therapy    Orthopedic: Status post right partial fifth metatarsal base resection with antibiotic beads   Radiographs: 1. Antibiotic beads at the resected fifth metatarsal base with a vacuum wound apparatus  in place. 2. Chronic arterial calcifications.   Pathology: Pending  Micro: Rare GPC's pending  Assessment:   1. SIRS (systemic inflammatory response syndrome) (HCC)   2. 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  POD # 2 s/p partial fifth metatarsal base resection antibiotic beads and wound VAC application -Progressing well this morning, struggling with pain control having spasms will add Flexeril  for this. -AC was reapplied with myself and wound ostomy care nurse appreciate assistance with this.  Recommend continue twice weekly VAC changes will need VAC therapy for 1 to 3 months. -He does have contracture on the right lower extremity or at least is developing contracture and any effort to keep the right leg straight and use a Prevalon boot to offload the lateral midfoot on the right side would improve his chance at wound healing.  If he sleeps with his knee in a flexed position and with the lateral aspect of the foot towards the bed unlikely to achieve healing there. -Wound VAC next change will be next week Tuesday will maintain Tuesday Friday schedule. -Recommendation for BKA on the left lower extremity, though after discussion with Dr. Sheree this morning question if patient's son will be agreeable to this -WB Status: Weightbearing as tolerated in postop shoe on the right -Sutures: None present. -Medications/ABX: ABX per ID -Dressing: Continue VAC  therapy to the right lower extremity twice weekly VAC change per wound ostomy care nurse next change next week Tuesday  - Overall patient seems to be in severe pain on the right lower extremity and worried that he will also have severe pain on the left side if he has amputation.  May be beneficial to involve palliative care to assist with goals of  care and decision making processes in regards to the left lower extremity. Requires BKA for source control of infeciton, without this prognosis is poor.  - F/u Plan: Will sign off at this time patient to follow-up in 2 weeks from discharge        Patrick [name], DPM [facility]  "

## 2024-11-14 NOTE — Consult Note (Addendum)
 WOC Nurse wound follow up Wound type: surgical right lateral foot Measurement: 4 cm x 3 cm x 2 cm Wound bed: 100% red, moist, with antibiotic beads visible in wound bed Drainage (amount, consistency, odor) sanguinous Periwound: intact Dressing procedure/placement/frequency:  Cleansed wound with normal saline Filled wound with  ___1_ piece of black foam, barrier ring applied circumferentially to help achieve seal. Sealed NPWT dressing at HG Patient tolerated procedure well    WOC nurse will continue to provide NPWT dressing changed due to the complexity of the dressing change.      Thank you,  Doyal Polite, MSN, RN, [facility] WOC Team [phone] (Available Mon-Fri 0700-1500)

## 2024-11-14 NOTE — Consult Note (Signed)
 WOC Nurse wound follow up  WOC team notified by nursing, NPWT machine is alarming with low suction levels.  Presented to bedside, frank blood in cannister and track pad causing blockage of NPWT.  Discussed with Dr Malvin, NPWT to be stopped with plan for wet to dry dressing changes.  Wound type: surgical  Measurement: R lateral foot Wound bed: 100 % red, with antibiotic beads visible Drainage (amount, consistency, odor) sanguinous Periwound: intact Dressing procedure/placement/frequency:  Cleanse wound with NS.  Pack wound with saline soaked gauze and cover with dry gauze and wrap with Kerlix and ace wrap.  Change daily.  WOC team will not follow patient at this time, please re consult if new needs arise.  Thank you, Doyal Polite, MSN, RN, [facility] WOC Team [phone] (Available Mon-Fri 0700-1500)

## 2024-11-14 NOTE — TOC Progression Note (Addendum)
 Transition of Care [facility]) - Progression Note    Patient Details  Name: Patrick [name] MRN: [id number] Date of Birth: Jan 04, 1935  Transition of Care [facility]) CM/SW Contact  Julio Storr SHAUNNA [place], [place] Phone Number: 11/14/2024, 3:52 PM  Clinical Narrative:     CSW called pt's son, [name], to discuss disposition; left voicemail requesting return call.    Expected Discharge Plan: Skilled Nursing Facility Barriers to Discharge: Continued Medical Work up               Expected Discharge Plan and Services                                               Social Drivers of Health (SDOH) Interventions SDOH Screenings   Food Insecurity: Patient Unable To Answer (11/05/2024)  Housing: Low Risk (03/18/2024)   Received from [facility]  Transportation Needs: Patient Unable To Answer (11/05/2024)  Utilities: Patient Unable To Answer (11/05/2024)  Financial Resource Strain: Low Risk (03/18/2024)   Received from [facility]  Physical Activity: Unknown (03/18/2024)   Received from [facility]  Social Connections: Patient Unable To Answer (11/05/2024)  Stress: No Stress Concern Present (03/18/2024)   Received from [facility]  Tobacco Use: Medium Risk (11/08/2024)    Readmission Risk Interventions     No data to display

## 2024-11-14 NOTE — Progress Notes (Signed)
" °  Progress Note    11/14/2024 1:05 PM 2 Days Post-Op  Subjective: Patient is sleeping but wakes easily  Vitals:   11/14/24 0231 11/14/24 0822  BP: 127/88 (!) 141/81  Pulse: 79 71  Resp: 18   Temp: 97.9 F (36.6 C) 98.4 F (36.9 C)  SpO2: 100% 90%    Physical Exam: Palpable anterior tibial pulses bilaterally Dressing right foot clean dry intact and there is a wound VAC of the right foot  CBC    Component Value Date/Time   WBC 17.6 (H) 11/13/2024 0204   RBC 3.73 (L) 11/13/2024 0204   RBC 3.78 (L) 11/13/2024 0204   HGB 10.3 (L) 11/13/2024 0204   HCT 29.5 (L) 11/13/2024 0204   PLT 530 (H) 11/13/2024 0204   MCV 79.1 (L) 11/13/2024 0204   MCH 27.6 11/13/2024 0204   MCHC 34.9 11/13/2024 0204   RDW 14.9 11/13/2024 0204   LYMPHSABS 6.8 (H) 11/13/2024 0204   MONOABS 1.4 (H) 11/13/2024 0204   EOSABS 0.4 11/13/2024 0204   BASOSABS 0.1 11/13/2024 0204    BMET    Component Value Date/Time   NA 131 (L) 11/13/2024 0204   K 4.3 11/13/2024 0204   CL 98 11/13/2024 0204   CO2 26 11/13/2024 0204   GLUCOSE 135 (H) 11/13/2024 0204   BUN 23 11/13/2024 0204   CREATININE 1.08 11/13/2024 0204   CALCIUM  9.5 11/13/2024 0204   GFRNONAA >60 11/13/2024 0204    INR No results found for: INR   Intake/Output Summary (Last 24 hours) at 11/14/2024 1305 Last data filed at 11/14/2024 1142 Gross per 24 hour  Intake 200 ml  Output 1100 ml  Net -900 ml     Assessment/plan:  89 y.o. male is s/p bilateral lower extremity revascularization now status post debridement of the right foot and we have discussed proceeding with left below-knee amputation given that he has calcaneal osteomyelitis with overlying eschar and persistent leukocytosis.  I was able to speak with both of his sons via telephone today and at this time they do not want any further surgeries unless absolutely necessary.  We discussed that at this time the ulcer is non-life-threatening although unlikely to heal with antibiotics.   They are hopeful that their father would never require amputation.  As such we will not plan for below-knee amputation as previously scheduled today and we will see him as an outpatient for further evaluation.   Devlin Mcveigh C. Sheree, MD [facility] Office: [phone] Pager: [phone]  11/14/2024 1:05 PM  "

## 2024-11-15 DIAGNOSIS — F03918 Unspecified dementia, unspecified severity, with other behavioral disturbance: Secondary | ICD-10-CM | POA: Diagnosis not present

## 2024-11-15 DIAGNOSIS — E871 Hypo-osmolality and hyponatremia: Secondary | ICD-10-CM | POA: Diagnosis not present

## 2024-11-15 DIAGNOSIS — E8809 Other disorders of plasma-protein metabolism, not elsewhere classified: Secondary | ICD-10-CM | POA: Diagnosis not present

## 2024-11-15 DIAGNOSIS — L0390 Cellulitis, unspecified: Secondary | ICD-10-CM | POA: Diagnosis not present

## 2024-11-15 LAB — PHOSPHORUS: Phosphorus: 2.8 mg/dL (ref 2.5–4.6)

## 2024-11-15 LAB — CBC WITH DIFFERENTIAL/PLATELET
Abs Immature Granulocytes: 0.16 K/uL — ABNORMAL HIGH (ref 0.00–0.07)
Basophils Absolute: 0.1 K/uL (ref 0.0–0.1)
Basophils Relative: 1 %
Eosinophils Absolute: 0.2 K/uL (ref 0.0–0.5)
Eosinophils Relative: 1 %
HCT: 36.4 % — ABNORMAL LOW (ref 39.0–52.0)
Hemoglobin: 12.5 g/dL — ABNORMAL LOW (ref 13.0–17.0)
Immature Granulocytes: 1 %
Lymphocytes Relative: 37 %
Lymphs Abs: 6.2 K/uL — ABNORMAL HIGH (ref 0.7–4.0)
MCH: 27.5 pg (ref 26.0–34.0)
MCHC: 34.3 g/dL (ref 30.0–36.0)
MCV: 80.2 fL (ref 80.0–100.0)
Monocytes Absolute: 1 K/uL (ref 0.1–1.0)
Monocytes Relative: 6 %
Neutro Abs: 9.2 K/uL — ABNORMAL HIGH (ref 1.7–7.7)
Neutrophils Relative %: 54 %
Platelets: 551 K/uL — ABNORMAL HIGH (ref 150–400)
RBC: 4.54 MIL/uL (ref 4.22–5.81)
RDW: 15.8 % — ABNORMAL HIGH (ref 11.5–15.5)
Smear Review: NORMAL
WBC: 16.8 K/uL — ABNORMAL HIGH (ref 4.0–10.5)
nRBC: 0 % (ref 0.0–0.2)

## 2024-11-15 LAB — COMPREHENSIVE METABOLIC PANEL WITH GFR
ALT: 9 U/L (ref 0–44)
AST: 28 U/L (ref 15–41)
Albumin: 3.3 g/dL — ABNORMAL LOW (ref 3.5–5.0)
Alkaline Phosphatase: 63 U/L (ref 38–126)
Anion gap: 10 (ref 5–15)
BUN: 18 mg/dL (ref 8–23)
CO2: 24 mmol/L (ref 22–32)
Calcium: 9.1 mg/dL (ref 8.9–10.3)
Chloride: 95 mmol/L — ABNORMAL LOW (ref 98–111)
Creatinine, Ser: 0.99 mg/dL (ref 0.61–1.24)
GFR, Estimated: >60 mL/min
Glucose, Bld: 100 mg/dL — ABNORMAL HIGH (ref 70–99)
Potassium: 5.1 mmol/L (ref 3.5–5.1)
Sodium: 129 mmol/L — ABNORMAL LOW (ref 135–145)
Total Bilirubin: 0.4 mg/dL (ref 0.0–1.2)
Total Protein: 7.4 g/dL (ref 6.5–8.1)

## 2024-11-15 LAB — GLUCOSE, CAPILLARY
Glucose-Capillary: 97 mg/dL (ref 70–99)
Glucose-Capillary: 93 mg/dL (ref 70–99)
Glucose-Capillary: 102 mg/dL — ABNORMAL HIGH (ref 70–99)
Glucose-Capillary: 256 mg/dL — ABNORMAL HIGH (ref 70–99)

## 2024-11-15 LAB — MAGNESIUM: Magnesium: 2.2 mg/dL (ref 1.7–2.4)

## 2024-11-15 MED ORDER — FENTANYL CITRATE (PF) 50 MCG/ML IJ SOSY
12.5000 ug | PREFILLED_SYRINGE | INTRAMUSCULAR | Status: DC | PRN
  Administered 2024-11-17: 12.5 ug via INTRAVENOUS
  Filled 2024-11-15: qty 1

## 2024-11-15 NOTE — Plan of Care (Signed)
   Problem: Education: Goal: Knowledge of General Education information will improve Description Including pain rating scale, medication(s)/side effects and non-pharmacologic comfort measures Outcome: Progressing

## 2024-11-15 NOTE — Progress Notes (Signed)
 PT Cancellation Note  Patient Details Name: Patrick [name] MRN: [id number] DOB: 1935-04-30   Cancelled Treatment:    Reason Eval/Treat Not Completed: Other (comment). Pt unable to participate due to agitation/dementia. Will continue attempts. Question if pt will be able to participate in therapy for SNF. May need hospice vs custodial type care.  Rodgers ORN [place] 11/15/2024, 1:08 PM Rodgers Opal PT Acute [employer] [phone]

## 2024-11-15 NOTE — Progress Notes (Signed)
 " PROGRESS NOTE    Patrick [name]  FMW:[id number] DOB: [birth year] DOA: 11/03/2024 PCP: Maree Leni Edyth DELENA, MD   Brief Narrative:  Patrick [name] is a 89 y.o. male with a history of dementia, diabetes mellitus type 2, BPH/urinary retention s/p suprapubic catheter, pancreatic mass.  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 Vascular surgery and podiatry consulted. Empiric IV antibiotics started. Patient underwent angiogram with left dorsalis pedis and left anterior tibial angioplasty.  He is now status post I&D with fifth metatarsal base resection of the right foot.  Current plan is for left lower extremity BKA and he has tentatively scheduled for Left BKA late AM on 11/14/24 however family did not consent to amputation.  ID has not been consulted and they are recommending palliative care consultation and changing IV antibiotics to oral doxycycline  and Augmentin  and plan for 4 weeks from 1/7 to 12/10/2024 with ID follow-up on 2026.    Vascular recommends outpatient follow-up as well for further evaluation.  Palliative was reconsulted for further goals of care discussion.  Wound VAC was placed and the podiatry team recommends that this need to be changed twice weekly with VAC therapy for 1 to 3 months.  Assessment and Plan:  Sepsis: Present on admission. Secondary to foot infection and Osteomyelitis. Blood cultures obtained and patient started empirically on antibiotics. Blood cultures no growth x5 days. Continue Antibiotics as below being changed   Left Foot Abscess/Osteomyelitis/Myositis: Noted on MRI. Podiatry and vascular surgery consulted on admission. Empiric Linezolid , Ceftriaxone  and Unasyn  started with transition to linezolid , cefepime  and metronidazole . Podiatry recommending leg amputation (AKA vs BKA). Vascular surgery aware and will work on timing of amputation of LLE.   -Continued Linezolid , cefepime  and metronidazole  until surgery however will discontinue and changed to oral doxycycline  and Augmentin  per ID recommendations. Vascular Surgery to follow up 11/13/24 about the timing of BKA however family has declined BKA so will not pursue this. WBC Trend remains elevated:  Recent Labs  Lab 11/08/24 0333 11/09/24 1558 11/10/24 0342 11/12/24 0518 11/13/24 0204 11/14/24 1320 11/15/24 0509  WBC 16.4* 16.4* 14.5* 18.7* 17.6* 18.4* 16.8*  -ID consulted and recommending changing IV antibiotics to p.o. doxycycline  and Augmentin  for 4 weeks of treatment and recommending palliative involvement given that he needs source control; palliative to have further goals of care discussion.  Right Foot Fifth Metatarsal Osteomyelitis s/p POD2 Partial fifth metatarsal base resection antibiotic beads and wound VAC application: Noted on MRI. Podiatry with plan for 5th ray resection I&D and he is postoperative day 0 with antibiotic bead application of vancomycin  tobramycin  as well as application of negative pressure wound.  However negative pressure wound did not work properly and started getting blockages and there is oozing noted from the surgical site.  The VAC was discontinued and he is now switched to saline wet-to-dry packing with wound and dry gauze 4 x 4 ABD pad Kerlix Ace wrapped with compression.  The podiatry team feels that the wound VAC is likely be reapplied later this week by the WOCN in the room consulting for wound VAC change on Friday for application and this was done -Aerobic/Anaerobic Cx showed:  Gram Stain FEW WBC PRESENT,BOTH PMN AND MONONUCLEAR RARE GRAM POSITIVE COCCI  Culture FEW CORYNEBACTERIUM SPECIES IDENTIFICATION TO FOLLOW Performed at [facility], 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 ABIs performed on 1/1 and are significant for moderate bilateral disease. Patient underwent angiogram on 1/2, with vascular performing left dorsalis pedis and left anterior tibial angioplasty. Patient started on Aspirin  325 mg po Daily and Clopidogrel  75 mg po Daily. Right lower extremity angiogram performed on 1/5 with laser arthrectomy of right anterior tibial artery and balloon angioplasty. Patient underwent Surgery as above on the R -Podiatry followed up again and applied wound VAC and recommending wound VAC for 1 to 3 months with changing twice weekly. - Palliative care consulted for consulted for further goals of care discussion  Hyponatremia: Na+ Trend:  Recent Labs  Lab 11/06/24 0758 11/09/24 1558 11/10/24 0342 11/12/24 0625 11/13/24 0204 11/14/24 1320 11/15/24 0509  NA 135 131* 134* 132* 131* 131* 129*  -Fluctuating. CTM and Trend and repeat CMP in the AM   Diabetes Mellitus Type 2: Well controlled based on hemoglobin A1C of 6.5%. Continue Very Sensitive Novolog  SSI AC/HS. CTM CBGs per Protocol. CBG Trend:  Recent Labs  Lab 11/14/24 0824 11/14/24 1243 11/14/24 1731 11/14/24 2046 11/15/24 0742 11/15/24 1141 11/15/24 1534  GLUCAP 120* 108* 98 97 97 93 102*   BPH / Urinary Retention: Patient is s/p history of suprapubic catheter placement. Patient follows with Urology as an outpatient.  Dysphagia: Checked SLP evaluation and Recommending Dysphagia 3 Diet with Thin Liquids  HypoNatremia: Na+ Trend:  Recent Labs  Lab 11/06/24 0758 11/09/24 1558 11/10/24 0342 11/12/24 0625 11/13/24 0204 11/14/24 1320 11/15/24 0509  NA 135 131* 134* 132* 131* 131* 129*  -CTM and Trend and Repeat CMP in the AM    Dementia: Appears to be mild, however patient developed some evidence of delirium on 1/3. Patient is already on Risperdal . Patient with hyperactive and hypoactive delirium. CT head unremarkable for acute process. Unclear if episodes are related to Risperidone  so this  was discontinued. C/w Delirium precautions and started Atarax  25 mg TIDprn Anxiety.  He was extremely agitated and combative today in the setting of his dementia with behavioral disturbances  Pancreatic Mass: Had a CT abdomen pelvis urogram with and without IV contrast back in May 2025 at [facility] which showed a complex lesion involving the pancreatic body measuring upwards of 1.7 x 1.3 x 1.7 cm.  This was recommended to be further eval with pancreatic protocol MRI or EUS.  He is referred to GI and unclear if he ever followed up on this.  Palliative care has been consulted for further goals of care discussion  Microcytic Anemia: Hgb/Hct Trend:  Recent Labs  Lab 11/08/24 0333 11/09/24 1558 11/10/24 0342 11/12/24 0518 11/13/24 0204 11/14/24 1320 11/15/24 0509  HGB 10.9* 11.6* 11.4* 11.0* 10.3* 11.9* 12.5*  HCT 31.7* 34.1* 32.8* 31.2* 29.5* 34.7* 36.4*  MCV 79.8* 80.4 79.6* 77.6* 79.1* 79.6* 80.2  -Checked Anemia Panel and showed an iron level of 35, UIBC 197, TIBC of 232, saturation ratios of 50%, ferritin level of 107 prolactin level of 9.1 and vitamin B12 584. CTM for S/Sx of Bleeding. No overt bleeding noted. Repeat CBC in the AM  Thrombocytosis: Improving. In the setting of Infection. Current Plt Count Trend:  Recent Labs  Lab 11/08/24 0333 11/09/24 1558 11/10/24 0342 11/12/24 0518 11/13/24 0204 11/14/24 1320 11/15/24 0509  PLT 668* 575* 658* 554* 530* 556* 551*  -CTM and Trend and repeat CBC in the AM   Hypoalbuminemia: Patient's Albumin Lvl Trend  went from 3.9 x2 -> 3.5 x2 -> 3.4. CTM & Trend & Repeat CMP in the AM  Overweight: Complicates overall prognosis and care. Estimated body mass index is 27.35 kg/m as calculated from the following:   Height as of this encounter: 5' 8 (1.727 m).   Weight as of this encounter: 81.6 kg. Weight Loss and Dietary Counseling given    DVT prophylaxis: enoxaparin  (LOVENOX ) injection 40 mg Start: 11/14/24 2115    Code Status: Full  Code Family Communication: No family currently at bedside  Disposition Plan:  Level of care: Med-Surg Status is: Inpatient Remains inpatient appropriate because: Is further goals of care discussion and will need to figure out his disposition for SNF   Consultants:  Vascular surgery Infectious diseases Podiatry Palliative care medicine  Procedures:  As delineated as above  Antimicrobials:  Anti-infectives (From admission, onward)    Start     Dose/Rate Route Frequency Ordered Stop   11/14/24 2200  doxycycline  (VIBRA -TABS) tablet 100 mg        100 mg Oral Every 12 hours 11/14/24 1606 12/11/24 0959   11/14/24 2200  amoxicillin -clavulanate (AUGMENTIN ) 875-125 MG per tablet 1 tablet        1 tablet Oral Every 12 hours 11/14/24 1606 12/11/24 0959   11/12/24 1102  tobramycin  (NEBCIN ) injection  Status:  Discontinued          As needed 11/12/24 1133 11/12/24 1134   11/12/24 1043  vancomycin  (VANCOCIN ) powder  Status:  Discontinued          As needed 11/12/24 1043 11/12/24 1115   11/11/24 2200  metroNIDAZOLE  (FLAGYL ) tablet 500 mg  Status:  Discontinued        500 mg Oral Every 12 hours 11/11/24 0943 11/14/24 1345   11/05/24 2200  linezolid  (ZYVOX ) tablet 600 mg  Status:  Discontinued       Placed in And Linked Group   600 mg Oral Every 12 hours 11/05/24 1341 11/14/24 1606   11/05/24 1445  ceFEPIme  (MAXIPIME ) 2 g in sodium chloride  0.9 % 100 mL IVPB  Status:  Discontinued        2 g 200 mL/hr over 30 Minutes Intravenous Every 12 hours 11/05/24 1350 11/14/24 1606   11/05/24 1445  metroNIDAZOLE  (FLAGYL ) IVPB 500 mg  Status:  Discontinued        500 mg 100 mL/hr over 60 Minutes Intravenous Every 12 hours 11/05/24 1352 11/11/24 0943   11/04/24 1430  Ampicillin -Sulbactam (UNASYN ) 3 g in sodium chloride  0.9 % 100 mL IVPB  Status:  Discontinued        3 g 200 mL/hr over 30 Minutes Intravenous Every 6 hours 11/04/24 1405 11/05/24 1340   11/04/24 1400  linezolid  (ZYVOX ) tablet 600 mg   Status:  Discontinued       Placed in And Linked Group   600 mg Oral Every 12 hours 11/04/24 1355 11/05/24 1341   11/04/24 1200  cefTRIAXone  (ROCEPHIN ) 2 g in sodium chloride  0.9 % 100 mL IVPB        2 g 200 mL/hr over 30 Minutes Intravenous  Once 11/04/24 1157 11/04/24 1241       Subjective: Seen at bedside and patient was extremely agitated and was demented with behavioral disturbances.  He started mocking me and did not want me to examine him.  Patient did not know who I was even though I have seen him the last 4 days.  No family currently at bedside  Objective: Vitals:  11/15/24 0348 11/15/24 0738 11/15/24 1142 11/15/24 1534  BP: 120/71 124/72 (!) 147/84 107/88  Pulse: 72 70 85 88  Resp: 16  20   Temp: 98 F (36.7 C) 97.9 F (36.6 C) 97.9 F (36.6 C) 98 F (36.7 C)  TempSrc: Oral Oral Oral Oral  SpO2: 92% 99% 100% 100%  Weight:      Height:        Intake/Output Summary (Last 24 hours) at 11/15/2024 1915 Last data filed at 11/15/2024 1808 Gross per 24 hour  Intake 240 ml  Output 600 ml  Net -360 ml   Filed Weights   11/04/24 1400 11/12/24 0930  Weight: 81.6 kg 81.6 kg   Examination: Physical Exam:  Constitutional: Very confused and very agitated and belligerent Respiratory: Equal chest rise Cardiovascular: Did let me not me examine Abdomen: Did not let me examine GU: Deferred. Skin: Did not let me see the ulcer but right leg is wrapped and collected wound VAC Neurologic: CN 2-12 grossly intact and able to move extremities independently but extremely agitated Psychiatric: Agitated and belligerent has impaired judgment and insight and demonstrated echolalia  Data Reviewed: I have personally reviewed following labs and imaging studies  CBC: Recent Labs  Lab 11/10/24 0342 11/12/24 0518 11/13/24 0204 11/14/24 1320 11/15/24 0509  WBC 14.5* 18.7* 17.6* 18.4* 16.8*  NEUTROABS  --   --  8.8* 9.2* 9.2*  HGB 11.4* 11.0* 10.3* 11.9* 12.5*  HCT 32.8* 31.2*  29.5* 34.7* 36.4*  MCV 79.6* 77.6* 79.1* 79.6* 80.2  PLT 658* 554* 530* 556* 551*   Basic Metabolic Panel: Recent Labs  Lab 11/10/24 0342 11/12/24 0625 11/13/24 0204 11/14/24 1320 11/15/24 0509  NA 134* 132* 131* 131* 129*  K 4.4 4.6 4.3 5.1 5.1  CL 100 97* 98 96* 95*  CO2 22 25 26 24 24   GLUCOSE 82 172* 135* 114* 100*  BUN 17 26* 23 21 18   CREATININE 0.98 1.17 1.08 0.99 0.99  CALCIUM  8.8* 9.5 9.5 9.4 9.1  MG  --   --  2.1 2.1 2.2  PHOS  --   --  2.7 2.4* 2.8   GFR: Estimated Creatinine Clearance: 50.9 mL/min (by C-G formula based on SCr of 0.99 mg/dL). Liver Function Tests: Recent Labs  Lab 11/09/24 1558 11/13/24 0204 11/14/24 1320 11/15/24 0509  AST 34 19 24 28   ALT 12 10 12 9   ALKPHOS 70 57 66 63  BILITOT 0.3 0.3 0.5 0.4  PROT 7.8 7.1 7.9 7.4  ALBUMIN 3.5 3.4* 3.6 3.3*   No results for input(s): LIPASE, AMYLASE in the last 168 hours. Recent Labs  Lab 11/09/24 1558  AMMONIA 20   Coagulation Profile: No results for input(s): INR, PROTIME in the last 168 hours. Cardiac Enzymes: No results for input(s): CKTOTAL, CKMB, CKMBINDEX, TROPONINI in the last 168 hours. BNP (last 3 results) No results for input(s): PROBNP in the last 8760 hours. HbA1C: No results for input(s): HGBA1C in the last 72 hours. CBG: Recent Labs  Lab 11/14/24 1731 11/14/24 2046 11/15/24 0742 11/15/24 1141 11/15/24 1534  GLUCAP 98 97 97 93 102*   Lipid Profile: No results for input(s): CHOL, HDL, LDLCALC, TRIG, CHOLHDL, LDLDIRECT in the last 72 hours. Thyroid Function Tests: No results for input(s): TSH, T4TOTAL, FREET4, T3FREE, THYROIDAB in the last 72 hours. Anemia Panel: Recent Labs    11/13/24 0204  VITAMINB12 584  FOLATE 9.1  FERRITIN 107  TIBC 232*  IRON 35*  RETICCTPCT 1.6   Sepsis Labs:  No results for input(s): PROCALCITON, LATICACIDVEN in the last 168 hours.  Recent Results (from the past 240 hours)   Aerobic/Anaerobic Culture w Gram Stain (surgical/deep wound)     Status: None (Preliminary result)   Collection Time: 11/12/24 11:04 AM   Specimen: Foot, Right; Tissue  Result Value Ref Range Status   Specimen Description TISSUE  Final   Special Requests RIGHT FIFTH METATARSAL BASE RESECTION  Final   Gram Stain   Final    FEW WBC PRESENT,BOTH PMN AND MONONUCLEAR RARE GRAM POSITIVE COCCI    Culture   Final    FEW CORYNEBACTERIUM SPECIES IDENTIFICATION TO FOLLOW Performed at [facility], [address] [place], [place] [address]    Report Status PENDING  Incomplete    Radiology Studies: No results found.  Scheduled Meds:  amoxicillin -clavulanate  1 tablet Oral Q12H   aspirin  EC  81 mg Oral Daily   clopidogrel   75 mg Oral Q breakfast   doxycycline   100 mg Oral Q12H   enoxaparin  (LOVENOX ) injection  40 mg Subcutaneous Q24H   feeding supplement  237 mL Oral BID WC   insulin  aspart  0-5 Units Subcutaneous QHS   insulin  aspart  0-6 Units Subcutaneous TID WC   multivitamin with minerals  1 tablet Oral Daily   nutrition supplement (JUVEN)  1 packet Oral BID BM   rosuvastatin   20 mg Oral Daily   sodium chloride  flush  3 mL Intravenous Q12H   sodium chloride  flush  3 mL Intravenous Q12H   Continuous Infusions:   LOS: 11 days   Alejandro Marker, DO [facility] Available via Epic secure chat 7am-7pm After these hours, please refer to coverage provider listed on [email or web address] 11/15/2024, 7:15 PM  "

## 2024-11-15 NOTE — TOC Progression Note (Signed)
 Transition of Care [facility]) - Progression Note    Patient Details  Name: Patrick [name] MRN: [id number] Date of Birth: November 09, 1934  Transition of Care [facility]) CM/SW Contact  Sherline Clack, [place] Phone Number: 11/15/2024, 12:59 PM  Clinical Narrative:     CSW attempted to call son [name] twice to discuss anticipated discharge plan. CSW left VM with call back number. CSW will continue to follow.   Expected Discharge Plan: Skilled Nursing Facility Barriers to Discharge: Continued Medical Work up               Expected Discharge Plan and Services                                               Social Drivers of Health (SDOH) Interventions SDOH Screenings   Food Insecurity: Patient Unable To Answer (11/05/2024)  Housing: Low Risk (03/18/2024)   Received from [facility]  Transportation Needs: Patient Unable To Answer (11/05/2024)  Utilities: Patient Unable To Answer (11/05/2024)  Financial Resource Strain: Low Risk (03/18/2024)   Received from [facility]  Physical Activity: Unknown (03/18/2024)   Received from [facility]  Social Connections: Patient Unable To Answer (11/05/2024)  Stress: No Stress Concern Present (03/18/2024)   Received from [facility]  Tobacco Use: Medium Risk (11/08/2024)    Readmission Risk Interventions     No data to display

## 2024-11-15 NOTE — Progress Notes (Signed)
 SLP Cancellation Note  Patient Details Name: Patrick [name] MRN: [id number] DOB: 1934/11/24   Cancelled treatment:       Reason Eval/Treat Not Completed: Patient declined, no reason specified: SLP and RN attempted to educate the pt on purpose of clinical swallow evaluation, cognitive impairment limiting comprehension. pt was verbally and physically combative (attempting to swing at nurse)- strongly refusing even small sips of water. Per MD, pt was placed NPO initially for a surgery but remained NPO because some intermittent coughing was observed. SLP will attempt to re-eval as time permits, but it is likely agitation will prevent assessment at this time.  Manuelita Blew M.S. CCC-SLP

## 2024-11-15 NOTE — Evaluation (Signed)
 Clinical/Bedside Swallow Evaluation Patient Details  Name: Patrick [name] MRN: [id number] Date of Birth: 02-15-1935  Today's Date: 11/15/2024 Time: SLP Start Time (ACUTE ONLY): 1457 SLP Stop Time (ACUTE ONLY): 1511 SLP Time Calculation (min) (ACUTE ONLY): 14 min  Past Medical History: History reviewed. No pertinent past medical history. Past Surgical History:  Past Surgical History:  Procedure Laterality Date   ABDOMINAL AORTOGRAM W/LOWER EXTREMITY Left 11/07/2024   Procedure: ABDOMINAL AORTOGRAM W/LOWER EXTREMITY;  Surgeon: Magda Debby SAILOR, MD;  Location: [facility] INVASIVE CV LAB;  Service: Cardiovascular;  Laterality: Left;   ABDOMINAL AORTOGRAM W/LOWER EXTREMITY N/A 11/10/2024   Procedure: ABDOMINAL AORTOGRAM W/LOWER EXTREMITY;  Surgeon: Sheree Penne Bruckner, MD;  Location: [facility] INVASIVE CV LAB;  Service: Cardiovascular;  Laterality: N/A;   AMPUTATION Right 11/12/2024   Procedure: AMPUTATION, FOOT, RAY DEBRIDEMENT FIFTH METATARSAL BASE WITH WOUND VACAND BEADS;  Surgeon: Malvin Marsa FALCON, DPM;  Location: [facility] OR;  Service: Orthopedics/Podiatry;  Laterality: Right;   LOWER EXTREMITY ANGIOGRAPHY  11/10/2024   Procedure: Lower Extremity Angiography;  Surgeon: Sheree Penne Bruckner, MD;  Location: [facility] INVASIVE CV LAB;  Service: Cardiovascular;;   LOWER EXTREMITY INTERVENTION Left 11/07/2024   Procedure: LOWER EXTREMITY INTERVENTION;  Surgeon: Magda Debby SAILOR, MD;  Location: [facility] INVASIVE CV LAB;  Service: Cardiovascular;  Laterality: Left;   PERIPHERAL VASCULAR ATHERECTOMY  11/10/2024   Procedure: PERIPHERAL VASCULAR ATHERECTOMY;  Surgeon: Sheree Penne Bruckner, MD;  Location: [facility] INVASIVE CV LAB;  Service: Cardiovascular;;   HPI:  Patrick [name] is a 89 y.o. M admitted for sepsis. Osteomeylitis of right fifth metatarsal, ulceration, amputation. Was NPO for a procedure, MD kept it noticing coughing. PMHx: dementia, T2DM, BPH/urinary retention s/p suprapubic catheter, PVD, neuropathy, and  pancreatic mass.    Assessment / Plan / Recommendation  Clinical Impression   Pt had no overt or subtle s/s of aspiration, he did exhibit prolonged bolus holding but this is typical for someone with dementia. He requires assistance with self-feeding due to muscle spasms. Recommend Dys 3/ thin liquid with general safe swallow precautions. No acute SLP needs needed, signing off. Please recons ult if concerns arise.  SLP Visit Diagnosis: Dysphagia, unspecified (R13.10)    Aspiration Risk  No limitations    Diet Recommendation    Dys 3/ thin liquid       Other Recommendations Oral Care Recommendations: Oral care BID     Swallow Evaluation Recommendations Recommendations: PO diet PO Diet Recommendation: Dysphagia 3 (Mechanical soft);Thin liquids (Level 0) Liquid Administration via: Spoon;Other (Comment) (To decrease likelihood of spilling from spasms) Medication Administration: Other (Comment) (Pt preference) Supervision: Staff to assist with self-feeding Swallowing strategies  : Minimize environmental distractions;Slow rate;Small bites/sips Postural changes: Position pt fully upright for meals;Stay upright 30-60 min after meals Oral care recommendations: Oral care BID (2x/day)   Assistance Recommended at Discharge    Functional Status Assessment Patient has had a recent decline in their functional status and demonstrates the ability to make significant improvements in function in a reasonable and predictable amount of time.  Frequency and Duration            Prognosis Prognosis for improved oropharyngeal function: Good Barriers to Reach Goals: Cognitive deficits      Swallow Study   General Date of Onset: 11/04/24 HPI: Patrick [name] is a 89 y.o. M admitted for sepsis. Osteomeylitis of right fifth metatarsal, ulceration, amputation. Was NPO for a procedure, MD kept it noticing coughing. PMHx: dementia, T2DM, BPH/urinary retention s/p suprapubic catheter, PVD, neuropathy,  and  pancreatic mass. Type of Study: Bedside Swallow Evaluation Previous Swallow Assessment: n/a Diet Prior to this Study: NPO Temperature Spikes Noted: No Respiratory Status: Room air History of Recent Intubation: No Behavior/Cognition: Requires cueing;Distractible;Confused;Cooperative Oral Cavity Assessment: Within Functional Limits Oral Care Completed by SLP: Other (Comment) (Not done due to concerns of agitation) Oral Cavity - Dentition: Dentures, top;Dentures, bottom Vision: Impaired for self-feeding Self-Feeding Abilities: Needs assist Patient Positioning: Upright in bed Baseline Vocal Quality: Normal Volitional Cough: Strong    Oral/Motor/Sensory Function Overall Oral Motor/Sensory Function: Within functional limits   Ice Chips     Thin Liquid Thin Liquid: Within functional limits    Nectar Thick     Honey Thick     Puree Puree: Within functional limits   Solid     Solid: Within functional limits     Patrick [name] M.S. CCC-SLP

## 2024-11-16 DIAGNOSIS — L0390 Cellulitis, unspecified: Secondary | ICD-10-CM | POA: Diagnosis not present

## 2024-11-16 DIAGNOSIS — E871 Hypo-osmolality and hyponatremia: Secondary | ICD-10-CM | POA: Diagnosis not present

## 2024-11-16 DIAGNOSIS — M86271 Subacute osteomyelitis, right ankle and foot: Secondary | ICD-10-CM | POA: Diagnosis not present

## 2024-11-16 DIAGNOSIS — E8809 Other disorders of plasma-protein metabolism, not elsewhere classified: Secondary | ICD-10-CM | POA: Diagnosis not present

## 2024-11-16 DIAGNOSIS — D75839 Thrombocytosis, unspecified: Secondary | ICD-10-CM | POA: Diagnosis not present

## 2024-11-16 DIAGNOSIS — A419 Sepsis, unspecified organism: Secondary | ICD-10-CM | POA: Diagnosis not present

## 2024-11-16 DIAGNOSIS — L03116 Cellulitis of left lower limb: Secondary | ICD-10-CM

## 2024-11-16 DIAGNOSIS — F03918 Unspecified dementia, unspecified severity, with other behavioral disturbance: Secondary | ICD-10-CM | POA: Diagnosis not present

## 2024-11-16 DIAGNOSIS — D649 Anemia, unspecified: Secondary | ICD-10-CM | POA: Diagnosis not present

## 2024-11-16 LAB — COMPREHENSIVE METABOLIC PANEL WITH GFR
ALT: 12 U/L (ref 0–44)
AST: 24 U/L (ref 15–41)
Albumin: 3.5 g/dL (ref 3.5–5.0)
Alkaline Phosphatase: 64 U/L (ref 38–126)
Anion gap: 13 (ref 5–15)
BUN: 28 mg/dL — ABNORMAL HIGH (ref 8–23)
CO2: 23 mmol/L (ref 22–32)
Calcium: 9.3 mg/dL (ref 8.9–10.3)
Chloride: 92 mmol/L — ABNORMAL LOW (ref 98–111)
Creatinine, Ser: 1.05 mg/dL (ref 0.61–1.24)
GFR, Estimated: >60 mL/min
Glucose, Bld: 252 mg/dL — ABNORMAL HIGH (ref 70–99)
Potassium: 4.5 mmol/L (ref 3.5–5.1)
Sodium: 128 mmol/L — ABNORMAL LOW (ref 135–145)
Total Bilirubin: 0.3 mg/dL (ref 0.0–1.2)
Total Protein: 7.6 g/dL (ref 6.5–8.1)

## 2024-11-16 LAB — CBC WITH DIFFERENTIAL/PLATELET
Abs Immature Granulocytes: 0.1 K/uL — ABNORMAL HIGH (ref 0.00–0.07)
Basophils Absolute: 0.1 K/uL (ref 0.0–0.1)
Basophils Relative: 1 %
Eosinophils Absolute: 0.2 K/uL (ref 0.0–0.5)
Eosinophils Relative: 1 %
HCT: 30.9 % — ABNORMAL LOW (ref 39.0–52.0)
Hemoglobin: 10.9 g/dL — ABNORMAL LOW (ref 13.0–17.0)
Immature Granulocytes: 1 %
Lymphocytes Relative: 37 %
Lymphs Abs: 5.7 K/uL — ABNORMAL HIGH (ref 0.7–4.0)
MCH: 27.7 pg (ref 26.0–34.0)
MCHC: 35.3 g/dL (ref 30.0–36.0)
MCV: 78.6 fL — ABNORMAL LOW (ref 80.0–100.0)
Monocytes Absolute: 1.1 K/uL — ABNORMAL HIGH (ref 0.1–1.0)
Monocytes Relative: 7 %
Neutro Abs: 8.1 K/uL — ABNORMAL HIGH (ref 1.7–7.7)
Neutrophils Relative %: 53 %
Platelets: 513 K/uL — ABNORMAL HIGH (ref 150–400)
RBC: 3.93 MIL/uL — ABNORMAL LOW (ref 4.22–5.81)
RDW: 15.6 % — ABNORMAL HIGH (ref 11.5–15.5)
Smear Review: NORMAL
WBC: 15.4 K/uL — ABNORMAL HIGH (ref 4.0–10.5)
nRBC: 0 % (ref 0.0–0.2)

## 2024-11-16 LAB — GLUCOSE, CAPILLARY
Glucose-Capillary: 112 mg/dL — ABNORMAL HIGH (ref 70–99)
Glucose-Capillary: 275 mg/dL — ABNORMAL HIGH (ref 70–99)
Glucose-Capillary: 193 mg/dL — ABNORMAL HIGH (ref 70–99)
Glucose-Capillary: 195 mg/dL — ABNORMAL HIGH (ref 70–99)

## 2024-11-16 LAB — PHOSPHORUS: Phosphorus: 2.2 mg/dL — ABNORMAL LOW (ref 2.5–4.6)

## 2024-11-16 LAB — MAGNESIUM: Magnesium: 2.2 mg/dL (ref 1.7–2.4)

## 2024-11-16 MED ORDER — BISACODYL 10 MG RE SUPP
10.0000 mg | Freq: Every day | RECTAL | Status: DC | PRN
  Administered 2024-11-19: 10 mg via RECTAL
  Filled 2024-11-16: qty 1

## 2024-11-16 MED ORDER — SODIUM CHLORIDE 0.9 % IV SOLN: INTRAVENOUS | Status: DC

## 2024-11-16 MED ORDER — POLYETHYLENE GLYCOL 3350 17 G PO PACK
17.0000 g | PACK | Freq: Two times a day (BID) | ORAL | Status: DC
  Administered 2024-11-16 – 2024-11-19 (×7): 17 g via ORAL
  Filled 2024-11-16 (×7): qty 1

## 2024-11-16 MED ORDER — SODIUM PHOSPHATES 45 MMOLE/15ML IV SOLN
30.0000 mmol | Freq: Once | INTRAVENOUS | Status: AC
  Administered 2024-11-16: 30 mmol via INTRAVENOUS
  Filled 2024-11-16: qty 10

## 2024-11-16 MED ORDER — SENNOSIDES-DOCUSATE SODIUM 8.6-50 MG PO TABS
1.0000 | ORAL_TABLET | Freq: Two times a day (BID) | ORAL | Status: DC
  Administered 2024-11-16 – 2024-11-18 (×5): 1 via ORAL
  Filled 2024-11-16 (×5): qty 1

## 2024-11-16 NOTE — Progress Notes (Signed)
 " PROGRESS NOTE    Patrick [name]  FMW:[id number] DOB: 1935/08/12 DOA: 11/03/2024 PCP: Maree Leni Edyth DELENA, MD   Brief Narrative:  Patrick [name] is a 89 y.o. male with a history of dementia, diabetes mellitus type 2, BPH/urinary retention s/p suprapubic catheter, pancreatic mass.  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 Vascular surgery and podiatry consulted. Empiric IV antibiotics started. Patient underwent angiogram with left dorsalis pedis and left anterior tibial angioplasty.  He is now status post I&D with fifth metatarsal base resection of the right foot.  Current plan is for left lower extremity BKA and he has tentatively scheduled for Left BKA late AM on 11/14/24 however family did not consent to amputation.  ID has not been consulted and they are recommending palliative care consultation and changing IV antibiotics to oral doxycycline  and Augmentin  and plan for 4 weeks from 1/7 to 12/10/2024 with ID follow-up on 2026.    Vascular recommends outpatient follow-up as well for further evaluation.  Palliative was reconsulted for further goals of care discussion.  Wound VAC was placed and the podiatry team recommends that this need to be changed twice weekly with VAC therapy for 1 to 3 months.  Now has worsening Hyponatremia and Constipation so will start IVF and provide Bowel Regimen.   Assessment and Plan:  Sepsis: Present on admission. Secondary to foot infection and Osteomyelitis. Blood cultures obtained and patient started empirically on antibiotics. Blood cultures no growth x5 days. Continue Antibiotics as below being changed   Left Foot Abscess/Osteomyelitis/Myositis: Noted on MRI. Podiatry and vascular surgery consulted on admission. Empiric Linezolid , Ceftriaxone  and Unasyn  started with transition to linezolid , cefepime  and metronidazole . Podiatry recommending  leg amputation (AKA vs BKA). Vascular surgery aware and will work on timing of amputation of LLE.  -Continued Linezolid , cefepime  and metronidazole  until surgery however will discontinue and changed to oral doxycycline  and Augmentin  per ID recommendations. Vascular Surgery to follow up 11/13/24 about the timing of BKA however family has declined BKA so will not pursue this. WBC Trend remains elevated:  Recent Labs  Lab 11/09/24 1558 11/10/24 0342 11/12/24 0518 11/13/24 0204 11/14/24 1320 11/15/24 0509 11/16/24 1043  WBC 16.4* 14.5* 18.7* 17.6* 18.4* 16.8* 15.4*  -ID consulted and recommending changing IV antibiotics to p.o. doxycycline  and Augmentin  for 4 weeks of treatment and recommending palliative involvement given that he needs source control; palliative to have further goals of care discussion. Meeting to take place 11/18/24  Right Foot Fifth Metatarsal Osteomyelitis s/p POD2 Partial fifth metatarsal base resection antibiotic beads and wound VAC application: Noted on MRI. Podiatry with plan for 5th ray resection I&D and he is postoperative day 0 with antibiotic bead application of vancomycin  tobramycin  as well as application of negative pressure wound.  However negative pressure wound did not work properly and started getting blockages and there is oozing noted from the surgical site.  The VAC was discontinued and he is now switched to saline wet-to-dry packing with wound and dry gauze 4 x 4 ABD pad Kerlix Ace wrapped with compression.  The podiatry team feels that the wound VAC is likely be reapplied later this week by the WOCN in the room consulting for wound VAC change on Friday for application and this was done -Aerobic/Anaerobic Cx showed:  Gram Stain FEW WBC PRESENT,BOTH PMN AND MONONUCLEAR RARE GRAM POSITIVE COCCI Performed at [facility], [address], [place], [place] [address]  Culture FEW CORYNEBACTERIUM SPECIES CORYNEBACTERIUM JEIKEIUM Standardized susceptibility testing  for this organism is not available. 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 ABIs performed on 1/1 and are significant for moderate bilateral disease. Patient underwent angiogram on 1/2, with vascular performing left dorsalis pedis and left anterior tibial angioplasty. Patient started on Aspirin  325 mg po Daily and Clopidogrel  75 mg po Daily. Right lower extremity angiogram performed on 1/5 with laser arthrectomy of right anterior tibial artery and balloon angioplasty. Patient underwent Surgery as above on the R -Podiatry followed up again and applied wound VAC and recommending wound VAC for 1 to 3 months with changing twice weekly. - Palliative care consulted for consulted for further goals of care discussion and recommendations for code and continuing current scope of care. IN person GOC meeting to be held 11/18/24 with the Palliative Medicine Team   Hyponatremia: Na+ Trend:  Recent Labs  Lab 11/09/24 1558 11/10/24 0342 11/12/24 0625 11/13/24 0204 11/14/24 1320 11/15/24 0509 11/16/24 1043  NA 131* 134* 132* 131* 131* 129* 128*  -Fluctuating and will start NS @ 75 mL/hr and replete w/ IV Na Phos 30 mmol. CTM and Trend and repeat CMP in the AM   Hypophosphatemia: Phos Level was 1.8. Replete w/ IV Na Phos 30 mmmol. CTM & Replete as Necessary. Repeat Phos Level in the AM   Constipation: Start Senna-Docusate 1 tab po BID, Miralax  17 grams BID, and Bisacodyl  10 mg RC Suppository PRN Moderate Constipation  Diabetes Mellitus Type 2: Well controlled based on hemoglobin A1C of 6.5%. Continue Very Sensitive Novolog  SSI AC/HS. CTM CBGs per Protocol. CBG Trend:  Recent Labs  Lab 11/15/24 0742 11/15/24 1141 11/15/24 1534 11/15/24 2121 11/16/24 0739 11/16/24 1128 11/16/24 1540  GLUCAP 97 93 102* 256* 112* 275* 193*    BPH / Urinary Retention: Patient is s/p history of suprapubic catheter placement. Patient follows with Urology as an outpatient.  Dysphagia: Checked SLP evaluation and Recommending Dysphagia 3 Diet with Thin Liquids  HypoNatremia: Na+ Trend:  Recent Labs  Lab 11/09/24 1558 11/10/24 0342 11/12/24 0625 11/13/24 0204 11/14/24 1320 11/15/24 0509 11/16/24 1043  NA 131* 134* 132* 131* 131* 129* 128*  -Start NS @ 75 mL/hr. CTM and Trend and Repeat CMP in the AM    Dementia: Appears to be mild, however patient developed some evidence of delirium on 1/3. Patient is already on Risperdal . Patient with hyperactive and hypoactive delirium. CT head unremarkable for acute process. Unclear if episodes are related to Risperidone  so this was discontinued. C/w Delirium precautions and started Atarax  25 mg TIDprn Anxiety.  He was extremely agitated and combative today in the setting of his dementia with behavioral disturbances  Pancreatic Mass: Had a CT abdomen pelvis urogram with and without IV contrast back in May 2025 at [facility] which showed a complex lesion involving the pancreatic body measuring upwards of 1.7 x 1.3 x 1.7 cm.  This was recommended to be further eval with pancreatic protocol MRI or EUS.  He is referred to GI and unclear if he ever followed up on this.  Palliative care has been consulted for further goals of care discussion  Microcytic Anemia: Hgb/Hct Trend:  Recent Labs  Lab 11/09/24 1558 11/10/24 0342 11/12/24 0518 11/13/24 0204 11/14/24 1320 11/15/24 0509 11/16/24 1043  HGB  11.6* 11.4* 11.0* 10.3* 11.9* 12.5* 10.9*  HCT 34.1* 32.8* 31.2* 29.5* 34.7* 36.4* 30.9*  MCV 80.4 79.6* 77.6* 79.1* 79.6* 80.2 78.6*  -Checked Anemia Panel and showed an iron level of 35, UIBC 197, TIBC of 232, saturation ratios of 50%, ferritin level of 107 prolactin level of 9.1 and vitamin B12 584. CTM for S/Sx of Bleeding. No overt bleeding noted. Repeat CBC in the AM  Thrombocytosis: Improving. In  the setting of Infection. Current Plt Count Trend:  Recent Labs  Lab 11/09/24 1558 11/10/24 0342 11/12/24 0518 11/13/24 0204 11/14/24 1320 11/15/24 0509 11/16/24 1043  PLT 575* 658* 554* 530* 556* 551* 513*  -CTM and Trend and repeat CBC in the AM   Hypoalbuminemia: Patient's Albumin Lvl Trend went from 3.9 x2 -> 3.5 x2 -> 3.4 -> 3.5. CTM & Trend & Repeat CMP in the AM  Overweight: Complicates overall prognosis and care. Estimated body mass index is 27.35 kg/m as calculated from the following:   Height as of this encounter: 5' 8 (1.727 m).   Weight as of this encounter: 81.6 kg. Weight Loss and Dietary Counseling given   DVT prophylaxis: enoxaparin  (LOVENOX ) injection 40 mg Start: 11/14/24 2115    Code Status: Full Code Family Communication: No family present @ bedside   Disposition Plan:  Level of care: Med-Surg Status is: Inpatient Remains inpatient appropriate because: Needs further clinical improvement, SNF, and GOC Discussion   Consultants:  Palliative Care Medicine  Vascular Surgery Podiatry  Procedures:  As delineated as above  Antimicrobials:  Anti-infectives (From admission, onward)    Start     Dose/Rate Route Frequency Ordered Stop   11/14/24 2200  doxycycline  (VIBRA -TABS) tablet 100 mg        100 mg Oral Every 12 hours 11/14/24 1606 12/11/24 0959   11/14/24 2200  amoxicillin -clavulanate (AUGMENTIN ) 875-125 MG per tablet 1 tablet        1 tablet Oral Every 12 hours 11/14/24 1606 12/11/24 0959   11/12/24 1102  tobramycin  (NEBCIN ) injection  Status:  Discontinued          As needed 11/12/24 1133 11/12/24 1134   11/12/24 1043  vancomycin  (VANCOCIN ) powder  Status:  Discontinued          As needed 11/12/24 1043 11/12/24 1115   11/11/24 2200  metroNIDAZOLE  (FLAGYL ) tablet 500 mg  Status:  Discontinued        500 mg Oral Every 12 hours 11/11/24 0943 11/14/24 1345   11/05/24 2200  linezolid  (ZYVOX ) tablet 600 mg  Status:  Discontinued       Placed in And  Linked Group   600 mg Oral Every 12 hours 11/05/24 1341 11/14/24 1606   11/05/24 1445  ceFEPIme  (MAXIPIME ) 2 g in sodium chloride  0.9 % 100 mL IVPB  Status:  Discontinued        2 g 200 mL/hr over 30 Minutes Intravenous Every 12 hours 11/05/24 1350 11/14/24 1606   11/05/24 1445  metroNIDAZOLE  (FLAGYL ) IVPB 500 mg  Status:  Discontinued        500 mg 100 mL/hr over 60 Minutes Intravenous Every 12 hours 11/05/24 1352 11/11/24 0943   11/04/24 1430  Ampicillin -Sulbactam (UNASYN ) 3 g in sodium chloride  0.9 % 100 mL IVPB  Status:  Discontinued        3 g 200 mL/hr over 30 Minutes Intravenous Every 6 hours 11/04/24 1405 11/05/24 1340   11/04/24 1400  linezolid  (ZYVOX ) tablet 600 mg  Status:  Discontinued  Placed in And Linked Group   600 mg Oral Every 12 hours 11/04/24 1355 11/05/24 1341   11/04/24 1200  cefTRIAXone  (ROCEPHIN ) 2 g in sodium chloride  0.9 % 100 mL IVPB        2 g 200 mL/hr over 30 Minutes Intravenous  Once 11/04/24 1157 11/04/24 1241       Subjective: Seen and examined at bedside and is resting.  Wanting to sleep.  No nausea or vomiting.  Denied lightheadedness or dizziness.  No other concerns requested time.  Objective: Vitals:   11/16/24 0447 11/16/24 0741 11/16/24 1126 11/16/24 1539  BP: 96/61 136/84 123/73 132/73  Pulse: 78 85 70 76  Resp: 19 20  18   Temp: 97.8 F (36.6 C) 98 F (36.7 C) 97.9 F (36.6 C) 97.8 F (36.6 C)  TempSrc: Axillary Oral Oral Oral  SpO2: 94% 99% 100% 97%  Weight:      Height:        Intake/Output Summary (Last 24 hours) at 11/16/2024 1838 Last data filed at 11/16/2024 1652 Gross per 24 hour  Intake 310.55 ml  Output 1050 ml  Net -739.45 ml   Filed Weights   11/04/24 1400 11/12/24 0930  Weight: 81.6 kg 81.6 kg   Examination: Physical Exam:  Constitutional: Elderly chronically ill-appearing African-American male who appears calm and somnolent drowsy but little confused Respiratory: Diminished to auscultation bilaterally, no  wheezing, rales, rhonchi or crackles. Normal respiratory effort and patient is not tachypenic. No accessory muscle use.  Unlabored breathing Cardiovascular: RRR, no murmurs / rubs / gallops. S1 and S2 auscultated Abdomen: Soft, non-tender, non-distended. Bowel sounds positive.  GU: Deferred. Musculoskeletal: No clubbing / cyanosis of digits/nails. No joint deformity upper and lower extremities. Skin: Left leg has an ulcer is wrapped and right leg is wrapped connected to wound VAC Neurologic: CN 2-12 grossly intact with no focal deficits. Romberg sign and cerebellar reflexes not assessed.  Psychiatric: A little somnolent drowsy but arousable.  Appears calm  Data Reviewed: I have personally reviewed following labs and imaging studies  CBC: Recent Labs  Lab 11/12/24 0518 11/13/24 0204 11/14/24 1320 11/15/24 0509 11/16/24 1043  WBC 18.7* 17.6* 18.4* 16.8* 15.4*  NEUTROABS  --  8.8* 9.2* 9.2* 8.1*  HGB 11.0* 10.3* 11.9* 12.5* 10.9*  HCT 31.2* 29.5* 34.7* 36.4* 30.9*  MCV 77.6* 79.1* 79.6* 80.2 78.6*  PLT 554* 530* 556* 551* 513*   Basic Metabolic Panel: Recent Labs  Lab 11/12/24 0625 11/13/24 0204 11/14/24 1320 11/15/24 0509 11/16/24 1043  NA 132* 131* 131* 129* 128*  K 4.6 4.3 5.1 5.1 4.5  CL 97* 98 96* 95* 92*  CO2 25 26 24 24 23   GLUCOSE 172* 135* 114* 100* 252*  BUN 26* 23 21 18  28*  CREATININE 1.17 1.08 0.99 0.99 1.05  CALCIUM  9.5 9.5 9.4 9.1 9.3  MG  --  2.1 2.1 2.2 2.2  PHOS  --  2.7 2.4* 2.8 2.2*   GFR: Estimated Creatinine Clearance: 48 mL/min (by C-G formula based on SCr of 1.05 mg/dL). Liver Function Tests: Recent Labs  Lab 11/13/24 0204 11/14/24 1320 11/15/24 0509 11/16/24 1043  AST 19 24 28 24   ALT 10 12 9 12   ALKPHOS 57 66 63 64  BILITOT 0.3 0.5 0.4 0.3  PROT 7.1 7.9 7.4 7.6  ALBUMIN 3.4* 3.6 3.3* 3.5   No results for input(s): LIPASE, AMYLASE in the last 168 hours. No results for input(s): AMMONIA in the last 168 hours. Coagulation  Profile: No  results for input(s): INR, PROTIME in the last 168 hours. Cardiac Enzymes: No results for input(s): CKTOTAL, CKMB, CKMBINDEX, TROPONINI in the last 168 hours. BNP (last 3 results) No results for input(s): PROBNP in the last 8760 hours. HbA1C: No results for input(s): HGBA1C in the last 72 hours. CBG: Recent Labs  Lab 11/15/24 1534 11/15/24 2121 11/16/24 0739 11/16/24 1128 11/16/24 1540  GLUCAP 102* 256* 112* 275* 193*   Lipid Profile: No results for input(s): CHOL, HDL, LDLCALC, TRIG, CHOLHDL, LDLDIRECT in the last 72 hours. Thyroid Function Tests: No results for input(s): TSH, T4TOTAL, FREET4, T3FREE, THYROIDAB in the last 72 hours. Anemia Panel: No results for input(s): VITAMINB12, FOLATE, FERRITIN, TIBC, IRON, RETICCTPCT in the last 72 hours. Sepsis Labs: No results for input(s): PROCALCITON, LATICACIDVEN in the last 168 hours.  Recent Results (from the past 240 hours)  Aerobic/Anaerobic Culture w Gram Stain (surgical/deep wound)     Status: None (Preliminary result)   Collection Time: 11/12/24 11:04 AM   Specimen: Foot, Right; Tissue  Result Value Ref Range Status   Specimen Description TISSUE  Final   Special Requests RIGHT FIFTH METATARSAL BASE RESECTION  Final   Gram Stain   Final    FEW WBC PRESENT,BOTH PMN AND MONONUCLEAR RARE GRAM POSITIVE COCCI Performed at [facility], [address] [place], [place] [address]    Culture   Final    FEW CORYNEBACTERIUM SPECIES CORYNEBACTERIUM JEIKEIUM Standardized susceptibility testing for this organism is not available. NO ANAEROBES ISOLATED; CULTURE IN PROGRESS FOR 5 DAYS    Report Status PENDING  Incomplete    Radiology Studies: No results found.  Scheduled Meds:  amoxicillin -clavulanate  1 tablet Oral Q12H   aspirin  EC  81 mg Oral Daily   clopidogrel   75 mg Oral Q breakfast   doxycycline   100 mg Oral Q12H   enoxaparin  (LOVENOX ) injection   40 mg Subcutaneous Q24H   feeding supplement  237 mL Oral BID WC   insulin  aspart  0-5 Units Subcutaneous QHS   insulin  aspart  0-6 Units Subcutaneous TID WC   multivitamin with minerals  1 tablet Oral Daily   nutrition supplement (JUVEN)  1 packet Oral BID BM   polyethylene glycol  17 g Oral BID   rosuvastatin   20 mg Oral Daily   senna-docusate  1 tablet Oral BID   sodium chloride  flush  3 mL Intravenous Q12H   sodium chloride  flush  3 mL Intravenous Q12H   Continuous Infusions:  sodium chloride  75 mL/hr at 11/16/24 1303   sodium PHOSPHATE  IVPB (in mmol)      LOS: 12 days   Alejandro Marker, DO [facility] Available via Epic secure chat 7am-7pm After these hours, please refer to coverage provider listed on [email or web address] 11/16/2024, 6:38 PM  "

## 2024-11-16 NOTE — Consult Note (Signed)
 "                              Consultation Note Date: 11/16/2024   Patient Name: Patrick [name]  DOB: 10-Aug-1935  MRN: [id number]  Age / Sex: 89 y.o., male  PCP: Patrick Leni Edyth DELENA, MD Referring Physician: Sherrill Alejandro Donovan, DO  Reason for Consultation: Establishing goals of care  HPI/Patient Profile: 89 y.o. male  with past medical history significant of dementia, pancreatic mass, right femur lytic lesion, glucose intolerance, hematuria, history of suprapubic catheter, prostate enlargement admitted on 11/03/2024 due to bilateral foot pain for the past 2 weeks, tenderness, edema, and erythema of the left lower extremity, particularly the left foot for the past week.  He is unable to provide much history.  He has been restless and pulled his IV out.    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  Vascular surgery and podiatry consulted.  Empiric IV antibiotics started, patient underwent angiogram with left dorsalis pedis and left anterior tibial angioplasty.  He is now status post I&D with fifth metatarsal base resection of the right foot.  Current plan is for left lower extremity below-knee amputation and he is tentatively scheduled for left BKA late a.m. on 11/14/2024 however family did not consent for amputation.  ID has not been consulted and they are recommending palliative care consultation and changing IV antibiotic to oral doxycycline  and Augmentin  and plan for 4 weeks from 11/12/2024 to 12/10/2024 with ID follow-up thereafter.  Vascular recommends outpatient follow-up as well for further evaluation.  Wound VAC was placed and the podiatry team recommends that this need to be changed twice weekly with VAC therapy for 1 to 3 months.  PMT has been consulted to assist with goals of care conversation. Patient/Family face treatment option decisions, advanced directive decisions and anticipatory care needs.   Family face treatment option  decision, advance directive decisions and anticipatory care needs.   This is patient's 13th day of hospital stay.  Clinical Assessment and Goals of Care:  I have reviewed medical records including: EPIC notes: Reviewed Dr. Sherrill hospitalist progress note from 11/15/2024 detailing treatment plan mainly for left foot abscess/osteomyelitis/myositis, right foot fifth metatarsal osteomyelitis status post partial fifth metatarsal base resection, antibiotic beads and wound VAC application, possible critical limb ischemia of bilateral lower extremity, hyponatremia, dysphagia, and dementia.  Reviewed SLP progress note from 11/15/2024: Patient had no overt perceptible signs and symptoms of aspiration although he did exhibit prolonged bolus holding which is typical for someone with dementia.  Recommends dysphagia 3 thin liquid diet.  Reviewed to track clinical course and prognostication.  MAR: Reviewed PRN meds received over the last 24 hours.  Received as needed oxycodone  5 mg x 1 dose, as needed Percocet x 1 dose reviewed to assess needs for medication adjustment to optimize comfort.  Available advanced directives in ACP: None on file. Labs: Reviewed most recent labs from 11/16/2024: Hyponatremia at 128, hypophosphatemia 2.2, ongoing but improving leukocytosis with WBC of 15.4. Reviewed to assist with prescribing and prognostication.  Met with patient to introduce self, perform my assessment, then I reached out to patient's children Patrick telephonically [name] also joined us  shortly after)  to attempt to discuss diagnosis prognosis, GOC, EOL wishes, disposition and options.  Visited patient at bedside today, no family members were present.  He was resting comfortably in bed, watching television, and appeared in no  acute distress.  He was able to communicate basic needs, though intermittent confusion was noted.  Information provided by the patient was largely accurate when verified with his son Patrick.  The patient  endorsed ongoing pain and discomfort in both lower extremities.  He was unable to explain the reason for his hospitalization but did acknowledge that he is not at home.  He demonstrated word finding difficulty and became easily frustrated during the interaction.  I attempted to explore topics related to CODE STATUS and life-sustaining treatment.  The patient was unable to provide clear responses and became emotional, stating, I do not want to be a burden to my children.  Shortly thereafter, I contacted the patient's son, Patrick and Patrick [name], by phone to introduce myself and initiate goals of care discussions.  We briefly discussed CODE STATUS, and I explained that this conversations were part of palliative medicine to support advance care planning in the event of cardiopulmonary arrest.  They expressed understanding and requested that further goals of care discussion take place in person rather than by phone.  He and stated he is available on Tuesday, 11/18/2024 between 10 AM to 11 AM for an in person meeting, with Patrick [name] able to join by phone.  I agreed this was reasonable and will assist in arranging the meeting.  During the encounter, Patrick (son) was engaged to review their understanding of the reason for the current hospital admission .Patrick was able to articulate the primary reason for hospitalization, describing the recent changes in health status that prompted evaluation and treatment. He mentioned that he initially brought patient to [facility] due to increasing pain to patient;s bilateral foot. Based on his prior discussion with the medical team, Patrick shared that he understands that patient has no good blood flow to his lower extremities, that patient underwent procedure in hopes to increase blood flow to the area, and also underwent wound debridement. Patrick shared that Vascular recommended for patient to undergo LLE amputation, but they decided not to proceed that route given patient's age and co  morbidities and wanting to explore other treatment options. They also demonstrated awareness of the patients ongoing medical conditions such as patient's dementia.   I explained that the patient is currently receiving all appropriate medical interventions to address his active medical issues, including right foot osteomyelitis and possible critical limb ischemia.  I reviewed what full CODE STATUS entails, specifically the interventions performed in the event of cardiopulmonary arrest.  In the context of patient's high disease burden, I shared my recommendation to consider changing CODE STATUS to DNR/DNI.  I explained that the patient is currently receiving all appropriate medical interventions to address his active medical issues, including right foot osteomyelitis and possible critical limb ischemia. I reviewed what full code status entails, specifically the interventions performed in the event of cardiopulmonary arrest. In the context of the patients high disease burden, I shared my recommendation to consider a change in code status to DNR/DNI.  Created space and opportunity for family to explore thoughts and feelings regarding patient's current medical condition.   Questions and concerns were addressed. The family was encouraged to call with questions or concerns.  PMT will continue to support holistically.  Coordinated plan of care with bedside RN. RN reports no significant changes. Patient remains intermittently confused due to underlying dementia. Pain adequately controlled with current pain regimen.  Functional and Nutritional State: Patient is currently with functional limitation due to acute and chronic illnesses.   Palliative Symptoms: Generalized  weakness, intermittent confusion (baseline dementia), pain to BLE.   Code Status: Full Code   Primary Decision Maker: Patrick [name] (son)    SUMMARY OF RECOMMENDATIONS   # Complex medical decision making/goals of care Code Status:  Continue with Full Code Continue current scope of care Goal of care is medical stabilization and recovery to the extent this is possible Family requested in-person GOC meeting. Patrick shared that he is available on Tuesday 11/18/2024. I shared that another PMT provider will be in touch to arrange this.  Podiatry/Vascular recommended LLE BKA, family declined given patient's advanced age and other comorbidities Palliative medicine team will continue to follow.   # Symptom management Per Primary team Palliative medicine is available to assist as needed.    # Psychosocial support Continue to provide psycho-social and emotional support to patient and family   # Treatment plan discussed with:  Patient, patient's children Patrick [name] and Patrick [name]), nursing staff and medical team.   Code Status/Advance Care Planning: Full code   Palliative Prophylaxis:  Aspiration, Bowel Regimen, Delirium Protocol, Oral Care, and Turn Reposition   Prognosis:  Prognosis is guarded given significant disease burden, and high risk for decompensation  Discharge Planning: To Be Determined      Primary Diagnoses: Present on Admission:  Sepsis due to cellulitis (HCC)  Thrombocytosis  Hyponatremia  Hyperproteinemia  Normocytic anemia  Dementia with behavioral disturbance (HCC)    Physical Exam Vitals and nursing note reviewed.  Constitutional:      Appearance: He is ill-appearing.  HENT:     Head: Normocephalic and atraumatic.     Mouth/Throat:     Mouth: Mucous membranes are moist.  Cardiovascular:     Rate and Rhythm: Normal rate.  Pulmonary:     Effort: Pulmonary effort is normal.  Abdominal:     General: Abdomen is flat. Bowel sounds are normal.     Palpations: Abdomen is soft.  Genitourinary:    Comments: Suprapubic catheter in place.  Musculoskeletal:     Comments: Generalized weakness  Skin:    General: Skin is warm.     Comments: Dressing to BLE intact and clean.  Neurological:     Mental  Status: He is alert. Mental status is at baseline.  Psychiatric:        Mood and Affect: Mood normal.     Comments: Became emotional when attempting to discuss GOC.       Vital Signs: BP 123/73 (BP Location: Left Arm)   Pulse 70   Temp 97.9 F (36.6 C) (Oral)   Resp 20   Ht 5' 8 (1.727 m)   Wt 81.6 kg   SpO2 100%   BMI 27.35 kg/m  Pain Scale: 0-10 POSS *See Group Information*: 2-Acceptable,Slightly drowsy, easily aroused Pain Score: Asleep   SpO2: SpO2: 100 % O2 Device:SpO2: 100 % O2 Flow Rate: .O2 Flow Rate (L/min): 2 L/min   Palliative Assessment/Data: 20%    I personally spent a total of 55 minutes in the care of the patient today including preparing to see the patient, getting/reviewing separately obtained history, performing a medically appropriate exam/evaluation, counseling and educating, referring and communicating with other health care professionals, documenting clinical information in the EHR, and coordinating care.    Thank you for allowing us  to participate in the care of Vinie JAYSON [name] Kathlyne JULIANNA Tracie Mickey, NP  Palliative Medicine Team Team phone # [phone]  Thank you for allowing the Palliative Medicine Team to assist in the care of this  patient. Please utilize secure chat with additional questions, if there is no response within 30 minutes please call the above phone number.  Palliative Medicine Team providers are available by phone from 7am to 7pm daily and can be reached through the team cell phone.  Should this patient require assistance outside of these hours, please call the patient's attending physician.   "

## 2024-11-16 NOTE — Plan of Care (Signed)
" °  Problem: Education: Goal: Knowledge of General Education information will improve Description: Including pain rating scale, medication(s)/side effects and non-pharmacologic comfort measures Outcome: Progressing   Problem: Health Behavior/Discharge Planning: Goal: Ability to manage health-related needs will improve Outcome: Progressing   Problem: Clinical Measurements: Goal: Ability to maintain clinical measurements within normal limits will improve Outcome: Progressing Goal: Will remain free from infection Outcome: Progressing Goal: Diagnostic test results will improve Outcome: Progressing Goal: Respiratory complications will improve Outcome: Progressing Goal: Cardiovascular complication will be avoided Outcome: Progressing   Problem: Activity: Goal: Risk for activity intolerance will decrease Outcome: Progressing   Problem: Nutrition: Goal: Adequate nutrition will be maintained Outcome: Progressing   Problem: Coping: Goal: Level of anxiety will decrease Outcome: Progressing   Problem: Elimination: Goal: Will not experience complications related to bowel motility Outcome: Progressing Goal: Will not experience complications related to urinary retention Outcome: Progressing   Problem: Pain Managment: Goal: General experience of comfort will improve and/or be controlled Outcome: Progressing   Problem: Safety: Goal: Ability to remain free from injury will improve Outcome: Progressing   Problem: Skin Integrity: Goal: Risk for impaired skin integrity will decrease Outcome: Progressing   Problem: Education: Goal: Ability to describe self-care measures that may prevent or decrease complications (Diabetes Survival Skills Education) will improve Outcome: Progressing Goal: Individualized Educational Video(s) Outcome: Progressing   Problem: Coping: Goal: Ability to adjust to condition or change in health will improve Outcome: Progressing   Problem: Fluid  Volume: Goal: Ability to maintain a balanced intake and output will improve Outcome: Progressing   Problem: Health Behavior/Discharge Planning: Goal: Ability to identify and utilize available resources and services will improve Outcome: Progressing Goal: Ability to manage health-related needs will improve Outcome: Progressing   Problem: Metabolic: Goal: Ability to maintain appropriate glucose levels will improve Outcome: Progressing   Problem: Nutritional: Goal: Maintenance of adequate nutrition will improve Outcome: Progressing Goal: Progress toward achieving an optimal weight will improve Outcome: Progressing   Problem: Skin Integrity: Goal: Risk for impaired skin integrity will decrease Outcome: Progressing   Problem: Tissue Perfusion: Goal: Adequacy of tissue perfusion will improve Outcome: Progressing   Problem: Education: Goal: Understanding of CV disease, CV risk reduction, and recovery process will improve Outcome: Progressing Goal: Individualized Educational Video(s) Outcome: Progressing   Problem: Activity: Goal: Ability to return to baseline activity level will improve Outcome: Progressing   Problem: Cardiovascular: Goal: Ability to achieve and maintain adequate cardiovascular perfusion will improve Outcome: Progressing Goal: Vascular access site(s) Level 0-1 will be maintained Outcome: Progressing   Problem: Health Behavior/Discharge Planning: Goal: Ability to safely manage health-related needs after discharge will improve Outcome: Progressing   Problem: Education: Goal: Knowledge of the prescribed therapeutic regimen will improve Outcome: Progressing   Problem: Bowel/Gastric: Goal: Gastrointestinal status for postoperative course will improve Outcome: Progressing   Problem: Cardiac: Goal: Ability to maintain an adequate cardiac output Outcome: Progressing Goal: Will show no evidence of cardiac arrhythmias Outcome: Progressing   Problem:  Nutritional: Goal: Will attain and maintain optimal nutritional status Outcome: Progressing   Problem: Neurological: Goal: Will regain or maintain usual level of consciousness Outcome: Progressing   Problem: Clinical Measurements: Goal: Ability to maintain clinical measurements within normal limits Outcome: Progressing Goal: Postoperative complications will be avoided or minimized Outcome: Progressing   Problem: Respiratory: Goal: Will regain and/or maintain adequate ventilation Outcome: Progressing Goal: Respiratory status will improve Outcome: Progressing   Problem: Skin Integrity: Goal: Demonstrates signs of wound healing without infection Outcome: Progressing   "

## 2024-11-16 NOTE — Plan of Care (Signed)
   Problem: Nutrition: Goal: Adequate nutrition will be maintained Outcome: Progressing

## 2024-11-17 DIAGNOSIS — Z515 Encounter for palliative care: Secondary | ICD-10-CM | POA: Diagnosis not present

## 2024-11-17 DIAGNOSIS — M869 Osteomyelitis, unspecified: Secondary | ICD-10-CM | POA: Diagnosis not present

## 2024-11-17 DIAGNOSIS — M86172 Other acute osteomyelitis, left ankle and foot: Secondary | ICD-10-CM | POA: Diagnosis not present

## 2024-11-17 DIAGNOSIS — L0390 Cellulitis, unspecified: Secondary | ICD-10-CM | POA: Diagnosis not present

## 2024-11-17 DIAGNOSIS — L8990 Pressure ulcer of unspecified site, unspecified stage: Secondary | ICD-10-CM | POA: Insufficient documentation

## 2024-11-17 DIAGNOSIS — E8809 Other disorders of plasma-protein metabolism, not elsewhere classified: Secondary | ICD-10-CM | POA: Diagnosis not present

## 2024-11-17 DIAGNOSIS — Z7189 Other specified counseling: Secondary | ICD-10-CM | POA: Diagnosis not present

## 2024-11-17 DIAGNOSIS — E871 Hypo-osmolality and hyponatremia: Secondary | ICD-10-CM | POA: Diagnosis not present

## 2024-11-17 DIAGNOSIS — Z66 Do not resuscitate: Secondary | ICD-10-CM | POA: Diagnosis not present

## 2024-11-17 DIAGNOSIS — F03918 Unspecified dementia, unspecified severity, with other behavioral disturbance: Secondary | ICD-10-CM | POA: Diagnosis not present

## 2024-11-17 LAB — CBC WITH DIFFERENTIAL/PLATELET
Abs Immature Granulocytes: 0.12 K/uL — ABNORMAL HIGH (ref 0.00–0.07)
Basophils Absolute: 0.1 K/uL (ref 0.0–0.1)
Basophils Relative: 1 %
Eosinophils Absolute: 0.2 K/uL (ref 0.0–0.5)
Eosinophils Relative: 2 %
HCT: 26.9 % — ABNORMAL LOW (ref 39.0–52.0)
Hemoglobin: 9.5 g/dL — ABNORMAL LOW (ref 13.0–17.0)
Immature Granulocytes: 1 %
Lymphocytes Relative: 42 %
Lymphs Abs: 6.4 K/uL — ABNORMAL HIGH (ref 0.7–4.0)
MCH: 27.8 pg (ref 26.0–34.0)
MCHC: 35.3 g/dL (ref 30.0–36.0)
MCV: 78.7 fL — ABNORMAL LOW (ref 80.0–100.0)
Monocytes Absolute: 1.1 K/uL — ABNORMAL HIGH (ref 0.1–1.0)
Monocytes Relative: 7 %
Neutro Abs: 7.3 K/uL (ref 1.7–7.7)
Neutrophils Relative %: 47 %
Platelets: 487 K/uL — ABNORMAL HIGH (ref 150–400)
RBC: 3.42 MIL/uL — ABNORMAL LOW (ref 4.22–5.81)
RDW: 15.8 % — ABNORMAL HIGH (ref 11.5–15.5)
Smear Review: NORMAL
WBC: 15.2 K/uL — ABNORMAL HIGH (ref 4.0–10.5)
nRBC: 0 % (ref 0.0–0.2)

## 2024-11-17 LAB — AEROBIC/ANAEROBIC CULTURE W GRAM STAIN (SURGICAL/DEEP WOUND)

## 2024-11-17 LAB — GLUCOSE, CAPILLARY
Glucose-Capillary: 147 mg/dL — ABNORMAL HIGH (ref 70–99)
Glucose-Capillary: 203 mg/dL — ABNORMAL HIGH (ref 70–99)
Glucose-Capillary: 117 mg/dL — ABNORMAL HIGH (ref 70–99)
Glucose-Capillary: 104 mg/dL — ABNORMAL HIGH (ref 70–99)

## 2024-11-17 LAB — COMPREHENSIVE METABOLIC PANEL WITH GFR
ALT: 11 U/L (ref 0–44)
AST: 19 U/L (ref 15–41)
Albumin: 3.3 g/dL — ABNORMAL LOW (ref 3.5–5.0)
Alkaline Phosphatase: 52 U/L (ref 38–126)
Anion gap: 9 (ref 5–15)
BUN: 30 mg/dL — ABNORMAL HIGH (ref 8–23)
CO2: 25 mmol/L (ref 22–32)
Calcium: 8.8 mg/dL — ABNORMAL LOW (ref 8.9–10.3)
Chloride: 98 mmol/L (ref 98–111)
Creatinine, Ser: 1.1 mg/dL (ref 0.61–1.24)
GFR, Estimated: >60 mL/min
Glucose, Bld: 135 mg/dL — ABNORMAL HIGH (ref 70–99)
Potassium: 4.4 mmol/L (ref 3.5–5.1)
Sodium: 132 mmol/L — ABNORMAL LOW (ref 135–145)
Total Bilirubin: 0.3 mg/dL (ref 0.0–1.2)
Total Protein: 6.7 g/dL (ref 6.5–8.1)

## 2024-11-17 LAB — MAGNESIUM: Magnesium: 2 mg/dL (ref 1.7–2.4)

## 2024-11-17 LAB — PHOSPHORUS: Phosphorus: 3.6 mg/dL (ref 2.5–4.6)

## 2024-11-17 NOTE — Progress Notes (Signed)
 " PROGRESS NOTE    Patrick [name]  FMW:[id number] DOB: [birth year] DOA: 11/03/2024 PCP: Maree Leni Edyth DELENA, MD   Brief Narrative:  Patrick [name] is a 89 y.o. male with a history of dementia, diabetes mellitus type 2, BPH/urinary retention s/p suprapubic catheter, pancreatic mass.  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 Vascular surgery and podiatry consulted. Empiric IV antibiotics started. Patient underwent angiogram with left dorsalis pedis and left anterior tibial angioplasty.  He is now status post I&D with fifth metatarsal base resection of the right foot.  Current plan is for left lower extremity BKA and he has tentatively scheduled for Left BKA late AM on 11/14/24 however family did not consent to amputation.  ID has not been consulted and they are recommending palliative care consultation and changing IV antibiotics to oral doxycycline  and Augmentin  and plan for 4 weeks from 1/7 to 12/10/2024 with ID follow-up on 2026.    Vascular recommends outpatient follow-up as well for further evaluation.  Palliative was reconsulted for further goals of care discussion.  Wound VAC was placed and the podiatry team recommends that this need to be changed twice weekly with VAC therapy for 1 to 3 months.  Now has worsening Hyponatremia and Constipation so will start IVF and provide Bowel Regimen. Palliative to have GOC Discussion. PT/OT Recommending SNF and awaiting Insurance Approval.   Assessment and Plan:  Sepsis: Present on admission. Secondary to foot infection and Osteomyelitis. Blood cultures obtained and patient started empirically on antibiotics. Blood cultures no growth x5 days. Continue Antibiotics (Changed by ID) as below. WBC Trend as below as well.    Left Foot Abscess/Osteomyelitis/Myositis: Noted on MRI. Podiatry and vascular surgery consulted on admission. Empiric  Linezolid , Ceftriaxone  and Unasyn  started with transition to linezolid , cefepime  and metronidazole . Podiatry recommending leg amputation (AKA vs BKA). Vascular surgery aware and will work on timing of amputation of LLE.  -Continued Linezolid , cefepime  and metronidazole  until surgery however will discontinue and changed to oral doxycycline  and Augmentin  per ID recommendations. Vascular Surgery to follow up 11/13/24 about the timing of BKA however family has declined BKA so will not pursue this. WBC Trend remains elevated:  Recent Labs  Lab 11/10/24 0342 11/12/24 0518 11/13/24 0204 11/14/24 1320 11/15/24 0509 11/16/24 1043 11/17/24 0531  WBC 14.5* 18.7* 17.6* 18.4* 16.8* 15.4* 15.2*  -ID consulted and recommending changing IV antibiotics to p.o. doxycycline  and Augmentin  for 4 weeks of treatment and recommending palliative involvement given that he needs source control; palliative to have further goals of care discussion. Meeting to take place 11/18/24  Right Foot Fifth Metatarsal Osteomyelitis s/p POD5 Partial fifth metatarsal base resection antibiotic beads and wound VAC application: Noted on MRI. Podiatry with plan for 5th ray resection I&D and he is postoperative day 0 with antibiotic bead application of vancomycin  tobramycin  as well as application of negative pressure wound.  However negative pressure wound did not work properly and started getting blockages and there is oozing noted from the surgical site.  The VAC was discontinued and he is now switched to saline wet-to-dry packing with wound and dry gauze 4 x 4 ABD pad Kerlix Ace wrapped with compression.  The podiatry team feels that the wound VAC is likely be reapplied later this week by the WOCN in the room consulting for wound VAC change on Friday for application and this was done -Aerobic/Anaerobic Cx showed:  Gram  Stain FEW WBC PRESENT,BOTH PMN AND MONONUCLEAR RARE GRAM POSITIVE COCCI Performed at [facility], [address],  [place], [place] [address]  Culture FEW CORYNEBACTERIUM SPECIES CORYNEBACTERIUM JEIKEIUM Standardized susceptibility testing for this organism is not available. MIXED ANAEROBIC FLORA PRESENT.  CALL LAB IF FURTHER IID REQUIRED.  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 ABIs performed on 1/1 and are significant for moderate bilateral disease. Patient underwent angiogram on 1/2, with vascular performing left dorsalis pedis and left anterior tibial angioplasty. Patient started on Aspirin  325 mg po Daily and Clopidogrel  75 mg po Daily. Right lower extremity angiogram performed on 1/5 with laser arthrectomy of right anterior tibial artery and balloon angioplasty. Patient underwent Surgery as above on the R -Podiatry followed up again and applied wound VAC and recommending wound VAC for 1 to 3 months with changing twice weekly. - Palliative care consulted for consulted for further goals of care discussion and recommendations remain for FULL Code and continuing current scope of care. IN person GOC meeting to be held 11/18/24 with the Palliative Medicine Team; He expressed to the Palliative team today to just let me die  Hyponatremia: Na+ Trend:  Recent Labs  Lab 11/10/24 0342 11/12/24 0625 11/13/24 0204 11/14/24 1320 11/15/24 0509 11/16/24 1043 11/17/24 0531  NA 134* 132* 131* 131* 129* 128* 132*  -Was Fluctuating; C/w NS @ 75 mL/hr. CTM and Trend and repeat CMP in the AM   Hypophosphatemia: Phos Level is now 3.6. CTM & Replete as Necessary. Repeat Phos Level in the AM   Constipation: Start Senna-Docusate 1 tab po BID, Miralax  17 grams BID, and Bisacodyl  10 mg RC Suppository PRN Moderate Constipation  Diabetes Mellitus Type 2: Well controlled based on hemoglobin A1C of 6.5%. Continue Very Sensitive Novolog  SSI AC/HS. CTM CBGs per Protocol. CBG Trend ranging from 112-275 on  the last 7 checks   BPH / Urinary Retention: Patient is s/p history of suprapubic catheter placement. Patient follows with Urology as an outpatient. Will need changing every 4 weeks  Dysphagia: SLP Recommending Dysphagia 3 Diet with Thin Liquids after eval  HypoNatremia: Na+ Trended down to 128 and is now improving:  Recent Labs  Lab 11/10/24 0342 11/12/24 0625 11/13/24 0204 11/14/24 1320 11/15/24 0509 11/16/24 1043 11/17/24 0531  NA 134* 132* 131* 131* 129* 128* 132*  -C/w NS @ 75 mL/hr. CTM and Trend and Repeat CMP in the AM    Dementia: Appears to be mild, however patient developed some evidence of delirium on 1/3. Patient was on Risperdal . Patient with hyperactive and hypoactive delirium. CT head unremarkable for acute process. Unclear if episodes are related to Risperidone  so this was discontinued.  -C/w Delirium precautions and started Atarax  25 mg TIDprn Anxiety.  Intermittently has some agitation and combativeness with behavioral disturbances  Pancreatic Mass: Had a CT abdomen pelvis urogram with and without IV contrast back in May 2025 at [facility] which showed a complex lesion involving the pancreatic body measuring upwards of 1.7 x 1.3 x 1.7 cm.  This was recommended to be further eval with pancreatic protocol MRI or EUS.  He is referred to GI and unclear if he ever followed up on this.  Palliative care has been consulted for further goals of care discussion  Microcytic Anemia: Hgb/Hct Trend:  Recent Labs  Lab 11/10/24 0342 11/12/24 0518 11/13/24 0204 11/14/24 1320 11/15/24 0509 11/16/24  1043 11/17/24 0531  HGB 11.4* 11.0* 10.3* 11.9* 12.5* 10.9* 9.5*  HCT 32.8* 31.2* 29.5* 34.7* 36.4* 30.9* 26.9*  MCV 79.6* 77.6* 79.1* 79.6* 80.2 78.6* 78.7*  -Checked Anemia Panel and showed an iron level of 35, UIBC 197, TIBC of 232, saturation ratios of 50%, ferritin level of 107 prolactin level of 9.1 and vitamin B12 584. CTM for S/Sx of Bleeding. No overt bleeding noted. Repeat CBC  in the AM  Thrombocytosis: Improving. In the setting of Infection. Current Plt Count Trend Improving:  Recent Labs  Lab 11/10/24 0342 11/12/24 0518 11/13/24 0204 11/14/24 1320 11/15/24 0509 11/16/24 1043 11/17/24 0531  PLT 658* 554* 530* 556* 551* 513* 487*  -CTM and Trend and repeat CBC in the AM   Hypoalbuminemia: Patient's Albumin Lvl Trend went from 3.9 x2 -> 3.5 x2 -> 3.4 -> 3.5 -> 3.3 x2. CTM & Trend & Repeat CMP in the AM  Overweight: Complicates overall prognosis and care. Estimated body mass index is 27.35 kg/m as calculated from the following:   Height as of this encounter: 5' 8 (1.727 m).   Weight as of this encounter: 81.6 kg. Weight Loss and Dietary Counseling given   DVT prophylaxis: enoxaparin  (LOVENOX ) injection 40 mg Start: 11/14/24 2115    Code Status: Full Code Family Communication: No family present @ bedside   Disposition Plan:  Level of care: Med-Surg Status is: Inpatient Remains inpatient appropriate because: Needs further clinical improvement, SNF, and GOC Discussion    Consultants:  Palliative Care Medicine  Vascular Surgery Podiatry  Procedures:  As delineated as above  Antimicrobials:  Anti-infectives (From admission, onward)    Start     Dose/Rate Route Frequency Ordered Stop   11/14/24 2200  doxycycline  (VIBRA -TABS) tablet 100 mg        100 mg Oral Every 12 hours 11/14/24 1606 12/11/24 0959   11/14/24 2200  amoxicillin -clavulanate (AUGMENTIN ) 875-125 MG per tablet 1 tablet        1 tablet Oral Every 12 hours 11/14/24 1606 12/11/24 0959   11/12/24 1102  tobramycin  (NEBCIN ) injection  Status:  Discontinued          As needed 11/12/24 1133 11/12/24 1134   11/12/24 1043  vancomycin  (VANCOCIN ) powder  Status:  Discontinued          As needed 11/12/24 1043 11/12/24 1115   11/11/24 2200  metroNIDAZOLE  (FLAGYL ) tablet 500 mg  Status:  Discontinued        500 mg Oral Every 12 hours 11/11/24 0943 11/14/24 1345   11/05/24 2200  linezolid   (ZYVOX ) tablet 600 mg  Status:  Discontinued       Placed in And Linked Group   600 mg Oral Every 12 hours 11/05/24 1341 11/14/24 1606   11/05/24 1445  ceFEPIme  (MAXIPIME ) 2 g in sodium chloride  0.9 % 100 mL IVPB  Status:  Discontinued        2 g 200 mL/hr over 30 Minutes Intravenous Every 12 hours 11/05/24 1350 11/14/24 1606   11/05/24 1445  metroNIDAZOLE  (FLAGYL ) IVPB 500 mg  Status:  Discontinued        500 mg 100 mL/hr over 60 Minutes Intravenous Every 12 hours 11/05/24 1352 11/11/24 0943   11/04/24 1430  Ampicillin -Sulbactam (UNASYN ) 3 g in sodium chloride  0.9 % 100 mL IVPB  Status:  Discontinued        3 g 200 mL/hr over 30 Minutes Intravenous Every 6 hours 11/04/24 1405 11/05/24 1340   11/04/24 1400  linezolid  (  ZYVOX ) tablet 600 mg  Status:  Discontinued       Placed in And Linked Group   600 mg Oral Every 12 hours 11/04/24 1355 11/05/24 1341   11/04/24 1200  cefTRIAXone  (ROCEPHIN ) 2 g in sodium chloride  0.9 % 100 mL IVPB        2 g 200 mL/hr over 30 Minutes Intravenous  Once 11/04/24 1157 11/04/24 1241       Subjective: Seen and examined at bedside and he is resting but complaining of some foot pain today.  No nausea or vomiting.  Denied lightheadedness or dizziness.  No other concerns or complaint at this time  Objective: Vitals:   11/16/24 2324 11/17/24 0423 11/17/24 0817 11/17/24 1716  BP: 122/61 125/70 114/60 123/62  Pulse: 77 70 68 64  Resp: 18 17  18   Temp: 98.3 F (36.8 C) 98 F (36.7 C) 98 F (36.7 C) 98.2 F (36.8 C)  TempSrc:   Oral Oral  SpO2: 99% 98% 97% 95%  Weight:      Height:        Intake/Output Summary (Last 24 hours) at 11/17/2024 1820 Last data filed at 11/17/2024 1725 Gross per 24 hour  Intake 1846.87 ml  Output 1450 ml  Net 396.87 ml   Filed Weights   11/04/24 1400 11/12/24 0930  Weight: 81.6 kg 81.6 kg   Examination: Physical Exam:  Constitutional: Elderly chronically ill-appearing African-American male who appears calm and  resting Respiratory: Diminished to auscultation bilaterally, no wheezing, rales, rhonchi or crackles. Normal respiratory effort and patient is not tachypenic. No accessory muscle use.  Unlabored breathing Cardiovascular: RRR, no murmurs / rubs / gallops. S1 and S2 auscultated. No extremity edema. Abdomen: Soft, non-tender, non-distended. Bowel sounds positive.  GU: Deferred.  Has a suprapubic catheter noted Musculoskeletal: No clubbing / cyanosis of digits/nails. No joint deformity upper and lower extremities.  Skin: Right foot is wrapped and connected to the wound VAC and left heel was covered Neurologic: CN 2-12 grossly intact with no focal deficits. Romberg sign and cerebellar reflexes not assessed.  Psychiatric: Awake and alert and not as confused today  Data Reviewed: I have personally reviewed following labs and imaging studies  CBC: Recent Labs  Lab 11/13/24 0204 11/14/24 1320 11/15/24 0509 11/16/24 1043 11/17/24 0531  WBC 17.6* 18.4* 16.8* 15.4* 15.2*  NEUTROABS 8.8* 9.2* 9.2* 8.1* 7.3  HGB 10.3* 11.9* 12.5* 10.9* 9.5*  HCT 29.5* 34.7* 36.4* 30.9* 26.9*  MCV 79.1* 79.6* 80.2 78.6* 78.7*  PLT 530* 556* 551* 513* 487*   Basic Metabolic Panel: Recent Labs  Lab 11/13/24 0204 11/14/24 1320 11/15/24 0509 11/16/24 1043 11/17/24 0531  NA 131* 131* 129* 128* 132*  K 4.3 5.1 5.1 4.5 4.4  CL 98 96* 95* 92* 98  CO2 26 24 24 23 25   GLUCOSE 135* 114* 100* 252* 135*  BUN 23 21 18  28* 30*  CREATININE 1.08 0.99 0.99 1.05 1.10  CALCIUM  9.5 9.4 9.1 9.3 8.8*  MG 2.1 2.1 2.2 2.2 2.0  PHOS 2.7 2.4* 2.8 2.2* 3.6   GFR: Estimated Creatinine Clearance: 45.8 mL/min (by C-G formula based on SCr of 1.1 mg/dL). Liver Function Tests: Recent Labs  Lab 11/13/24 0204 11/14/24 1320 11/15/24 0509 11/16/24 1043 11/17/24 0531  AST 19 24 28 24 19   ALT 10 12 9 12 11   ALKPHOS 57 66 63 64 52  BILITOT 0.3 0.5 0.4 0.3 0.3  PROT 7.1 7.9 7.4 7.6 6.7  ALBUMIN 3.4* 3.6 3.3* 3.5 3.3*  No  results for input(s): LIPASE, AMYLASE in the last 168 hours. No results for input(s): AMMONIA in the last 168 hours. Coagulation Profile: No results for input(s): INR, PROTIME in the last 168 hours. Cardiac Enzymes: No results for input(s): CKTOTAL, CKMB, CKMBINDEX, TROPONINI in the last 168 hours. BNP (last 3 results) No results for input(s): PROBNP in the last 8760 hours. HbA1C: No results for input(s): HGBA1C in the last 72 hours. CBG: Recent Labs  Lab 11/16/24 1540 11/16/24 2104 11/17/24 0826 11/17/24 1209 11/17/24 1721  GLUCAP 193* 195* 147* 203* 117*   Lipid Profile: No results for input(s): CHOL, HDL, LDLCALC, TRIG, CHOLHDL, LDLDIRECT in the last 72 hours. Thyroid Function Tests: No results for input(s): TSH, T4TOTAL, FREET4, T3FREE, THYROIDAB in the last 72 hours. Anemia Panel: No results for input(s): VITAMINB12, FOLATE, FERRITIN, TIBC, IRON, RETICCTPCT in the last 72 hours. Sepsis Labs: No results for input(s): PROCALCITON, LATICACIDVEN in the last 168 hours.  Recent Results (from the past 240 hours)  Aerobic/Anaerobic Culture w Gram Stain (surgical/deep wound)     Status: None   Collection Time: 11/12/24 11:04 AM   Specimen: Foot, Right; Tissue  Result Value Ref Range Status   Specimen Description TISSUE  Final   Special Requests RIGHT FIFTH METATARSAL BASE RESECTION  Final   Gram Stain   Final    FEW WBC PRESENT,BOTH PMN AND MONONUCLEAR RARE GRAM POSITIVE COCCI Performed at [facility], [address], [place], [place] [address]    Culture   Final    FEW CORYNEBACTERIUM SPECIES CORYNEBACTERIUM JEIKEIUM Standardized susceptibility testing for this organism is not available. MIXED ANAEROBIC FLORA PRESENT.  CALL LAB IF FURTHER IID REQUIRED.    Report Status 11/17/2024 FINAL  Final    Radiology Studies: No results found.  Scheduled Meds:  amoxicillin -clavulanate  1 tablet Oral Q12H    aspirin  EC  81 mg Oral Daily   clopidogrel   75 mg Oral Q breakfast   doxycycline   100 mg Oral Q12H   enoxaparin  (LOVENOX ) injection  40 mg Subcutaneous Q24H   feeding supplement  237 mL Oral BID WC   insulin  aspart  0-5 Units Subcutaneous QHS   insulin  aspart  0-6 Units Subcutaneous TID WC   multivitamin with minerals  1 tablet Oral Daily   nutrition supplement (JUVEN)  1 packet Oral BID BM   polyethylene glycol  17 g Oral BID   rosuvastatin   20 mg Oral Daily   senna-docusate  1 tablet Oral BID   sodium chloride  flush  3 mL Intravenous Q12H   sodium chloride  flush  3 mL Intravenous Q12H   Continuous Infusions:  sodium chloride  75 mL/hr at 11/17/24 1529    LOS: 13 days   Alejandro Marker, DO [facility] Available via Epic secure chat 7am-7pm After these hours, please refer to coverage provider listed on [email or web address] 11/17/2024, 6:20 PM  "

## 2024-11-17 NOTE — Progress Notes (Signed)
 Physical Therapy Treatment  Patient Details Name: Patrick [name] MRN: [id number] DOB: Dec 13, 1934 Today's Date: 11/17/2024   History of Present Illness Patrick [name] is a 89 y.o. M adm 11/03/24 for sepsis d/t LLE cellulitis of LLE. 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 Pt s/p aortogram RLE runoff with laser atherectomy of the right anterior tibial artery and balloon angioplasty 1/5. Pt s/p right foot I&D with fifth metatarsal base resection, application of antibiotic beads with vancomycin  and tobramycin , and wound vac application 1/7. Plan for left BKA 1/9. PMHx: dementia, T2DM, BPH/urinary retention s/p suprapubic catheter, PVD, neuropathy, and pancreatic mass.    PT Comments  Focus of session was repositioning in the bed in an attempt to facilitate mobility. Pt anxious in anticipation of pain, and extremely guarded with any movement or touch to the R LE. He was unwilling to sit up on EOB or attempt OOB this date. Upon arrival, noted pt sitting upright in the bed, leaning R, with knees flexed and feet pressed against footboard of bed. Pt allowed PT to slowly extend the foot of bed, however calling out in pain as the legs extended from their flexed position. PT placed a pillow under each leg. Mod assist required for RLE elevation, and pt able to elevate LLE with use of hands. Noted pt shaking with effort of attempting to lift leg, but PT able to slide pillow under leg with minimal clearance of bed. Adjusted trunk position and placed pillow behind pt's shoulders as well. Pt reports improved comfort at end of session and was appreciative. Will continue to follow.    If plan is discharge home, recommend the following: Supervision due to cognitive status;Direct supervision/assist for medications management;Direct supervision/assist for financial management;Two people to help with  walking and/or transfers;A lot of help with bathing/dressing/bathroom;Assistance with cooking/housework;Assist for transportation;Help with stairs or ramp for entrance   Can travel by private vehicle        Equipment Recommendations  [place] lift;Wheelchair (measurements PT);Wheelchair cushion (measurements PT);BSC/3in1    Recommendations for Other Services       Precautions / Restrictions Precautions Precautions: Fall Recall of Precautions/Restrictions: Impaired Precaution/Restrictions Comments: Surgical shoe in the room Required Braces or Orthoses: Other Brace Other Brace: R post-op shoe Restrictions Weight Bearing Restrictions Per Provider Order: No RLE Weight Bearing Per Provider Order: Weight bearing as tolerated     Mobility  Bed Mobility               General bed mobility comments: Repositioned in bed    Transfers                        Ambulation/Gait               General Gait Details: Unable   Stairs             Wheelchair Mobility     Tilt Bed    Modified Rankin (Stroke Patients Only)       Balance                                            Communication Communication Communication: Impaired Factors Affecting Communication: Reduced clarity of speech  Cognition Arousal: Alert Behavior During Therapy: Lability, Restless, Anxious   PT - Cognitive  impairments: History of cognitive impairments                         Following commands: Impaired Following commands impaired: Follows one step commands inconsistently    Cueing Cueing Techniques: Verbal cues, Gestural cues  Exercises      General Comments        Pertinent Vitals/Pain Pain Assessment Pain Assessment: Faces Faces Pain Scale: Hurts even more Pain Location: RLE/foot Pain Descriptors / Indicators: Grimacing, Guarding (Very anxious with the anticipation of pain) Pain Intervention(s): Limited activity within patient's  tolerance, Monitored during session, Repositioned    Home Living                          Prior Function            PT Goals (current goals can now be found in the care plan section) Acute Rehab PT Goals Patient Stated Goal: Decrease pain PT Goal Formulation: Patient unable to participate in goal setting Time For Goal Achievement: 11/27/24 Potential to Achieve Goals: Fair Progress towards PT goals: Not progressing toward goals - comment (Unwilling to attempt mobility due to fear of pain.)    Frequency    Min 2X/week      PT Plan      Co-evaluation              AM-PAC PT 6 Clicks Mobility   Outcome Measure  Help needed turning from your back to your side while in a flat bed without using bedrails?: A Lot Help needed moving from lying on your back to sitting on the side of a flat bed without using bedrails?: Total Help needed moving to and from a bed to a chair (including a wheelchair)?: Total Help needed standing up from a chair using your arms (e.g., wheelchair or bedside chair)?: Total Help needed to walk in hospital room?: Total Help needed climbing 3-5 steps with a railing? : Total 6 Click Score: 7    End of Session   Activity Tolerance: Patient limited by pain Patient left: in bed;with call bell/phone within reach;with bed alarm set Nurse Communication: Mobility status PT Visit Diagnosis: Pain;Difficulty in walking, not elsewhere classified (R26.2);Other abnormalities of gait and mobility (R26.89);Unsteadiness on feet (R26.81) Pain - Right/Left: Right Pain - part of body: Ankle and joints of foot     Time: 1450-1509 PT Time Calculation (min) (ACUTE ONLY): 19 min  Charges:    $Therapeutic Activity: 8-22 mins PT General Charges $$ ACUTE PT VISIT: 1 Visit                     Leita Sable, PT, DPT Acute Rehabilitation Services Secure Chat Preferred Office: [phone]    Leita JONETTA Sable 11/17/2024, 3:32 PM

## 2024-11-17 NOTE — Progress Notes (Signed)
 Palliative:  89 y.o. male  with past medical history significant of dementia, pancreatic mass, right femur lytic lesion, glucose intolerance, hematuria, history of suprapubic catheter, prostate enlargement admitted on 11/03/2024 due to bilateral foot pain for the past 2 weeks, tenderness, edema, and erythema of the left lower extremity, particularly the left foot for the past week.  He is unable to provide much history.  He has been restless and pulled his IV out. 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  Vascular surgery and podiatry consulted.  Empiric IV antibiotics started, patient underwent angiogram with left dorsalis pedis and left anterior tibial angioplasty.  He is now status post I&D with fifth metatarsal base resection of the right foot.  Current plan is for left lower extremity below-knee amputation and he is tentatively scheduled for left BKA late a.m. on 11/14/2024 however family did not consent for amputation.  ID has not been consulted and they are recommending palliative care consultation and changing IV antibiotic to oral doxycycline  and Augmentin  and plan for 4 weeks from 11/12/2024 to 12/10/2024 with ID follow-up thereafter.   Vascular recommends outpatient follow-up as well for further evaluation.  Wound VAC was placed and the podiatry team recommends that this need to be changed twice weekly with VAC therapy for 1 to 3 months. PMT has been consulted to assist with goals of care conversation. Patient/Family face treatment option decisions, advanced directive decisions and anticipatory care needs. Family face treatment option decision, advance directive decisions and anticipatory care needs.   I reviewed records and received update from my colleague. Family have requested in person discussion tomorrow. I met today with Mr. Chestnutt. No visitors present. Noted history of dementia although he does have some overall understanding of his  condition. He tells me that his foot does not hurt as bad but that he does not believe it will improve. He talks to me about his brother and how decisive and pragmatic his brother always was in life and at the end of his life. He talks of just let me die and being ready to go. Although he does repeat himself at times it is clear that he has had some very insightful thoughts about his health and end of life. He is tearful throughout our conversation. He agrees to meet tomorrow along with his sons to further discuss his wishes and goals together with them.   I called and discussed with son, [name]. [name] confirms desire to meet tomorrow 10 am. He has no questions for me today but we will discuss in detail tomorrow at bedside.   All questions/concerns addressed to the best of my ability.   Exam: Alert, oriented to person and to much of the situation. Repeats himself. Tearful at times but most of conversation appropriate to situation. No distress. Breathing regular, unlabored. Abd soft. Legs wrapped.   Plan:  - Family meeting confirmed for tomorrow 1/13 1000 am to discuss goals of care   50 min  [name], NP Palliative Medicine Team Pager [phone] (Please see [email or web address] for schedule) Team Phone [phone]

## 2024-11-17 NOTE — TOC Progression Note (Signed)
 Transition of Care [facility]) - Progression Note    Patient Details  Name: KAMDEN STANISLAW MRN: [id number] Date of Birth: 06/03/1935  [facility] CM/SW Contact  Inocente GORMAN Kindle, LCSW Phone Number: 11/17/2024, 12:07 PM  Clinical Narrative:    CSW following for GOC meeting with palliative tomorrow. Current placement barrier is patient's ability to work with therapy in order for insurance to approve SNF.    Expected Discharge Plan: TBD Barriers to Discharge: Continued Medical Work up              Expected Discharge Plan and Services                                               Social Drivers of Health (SDOH) Interventions SDOH Screenings   Food Insecurity: Patient Unable To Answer (11/05/2024)  Housing: Low Risk (03/18/2024)   Received from [facility]  Transportation Needs: Patient Unable To Answer (11/05/2024)  Utilities: Patient Unable To Answer (11/05/2024)  Financial Resource Strain: Low Risk (03/18/2024)   Received from [facility]  Physical Activity: Unknown (03/18/2024)   Received from [facility]  Social Connections: Patient Unable To Answer (11/05/2024)  Stress: No Stress Concern Present (03/18/2024)   Received from [facility]  Tobacco Use: Medium Risk (11/08/2024)    Readmission Risk Interventions     No data to display

## 2024-11-18 DIAGNOSIS — Z7189 Other specified counseling: Secondary | ICD-10-CM | POA: Diagnosis not present

## 2024-11-18 DIAGNOSIS — F03918 Unspecified dementia, unspecified severity, with other behavioral disturbance: Secondary | ICD-10-CM | POA: Diagnosis not present

## 2024-11-18 DIAGNOSIS — Z515 Encounter for palliative care: Secondary | ICD-10-CM | POA: Diagnosis not present

## 2024-11-18 DIAGNOSIS — L0390 Cellulitis, unspecified: Secondary | ICD-10-CM | POA: Diagnosis not present

## 2024-11-18 DIAGNOSIS — Z66 Do not resuscitate: Secondary | ICD-10-CM | POA: Diagnosis not present

## 2024-11-18 DIAGNOSIS — E8809 Other disorders of plasma-protein metabolism, not elsewhere classified: Secondary | ICD-10-CM | POA: Diagnosis not present

## 2024-11-18 DIAGNOSIS — M86172 Other acute osteomyelitis, left ankle and foot: Secondary | ICD-10-CM | POA: Diagnosis not present

## 2024-11-18 DIAGNOSIS — E871 Hypo-osmolality and hyponatremia: Secondary | ICD-10-CM | POA: Diagnosis not present

## 2024-11-18 LAB — GLUCOSE, CAPILLARY
Glucose-Capillary: 118 mg/dL — ABNORMAL HIGH (ref 70–99)
Glucose-Capillary: 174 mg/dL — ABNORMAL HIGH (ref 70–99)
Glucose-Capillary: 208 mg/dL — ABNORMAL HIGH (ref 70–99)
Glucose-Capillary: 168 mg/dL — ABNORMAL HIGH (ref 70–99)

## 2024-11-18 MED ORDER — SENNOSIDES-DOCUSATE SODIUM 8.6-50 MG PO TABS
2.0000 | ORAL_TABLET | Freq: Two times a day (BID) | ORAL | Status: DC
  Administered 2024-11-18 – 2024-11-19 (×2): 2 via ORAL
  Filled 2024-11-18 (×2): qty 2

## 2024-11-18 NOTE — Progress Notes (Addendum)
 " PROGRESS NOTE    Patrick [name]  FMW:[id number] DOB: [birth year] DOA: 11/03/2024 PCP: Maree Leni Edyth DELENA, MD   Brief Narrative:  Patrick [name] is a 89 y.o. male with a history of dementia, DMT2, BPH/urinary retention s/p suprapubic catheter, pancreatic mass.  Patient presented secondary to bilateral foot pain and swelling and found to have evidence of L calcaneous osteomyelitis and R 5th metatarsal osteomyelitis.   Vascular surgery and podiatry consulted. Empiric IV antibiotics started. Patient underwent angiogram with left dorsalis pedis and left anterior tibial angioplasty.  He is now status post I&D with fifth metatarsal base resection of the right foot. Recommendation was for LLE amputation but patient and family refused. ID consulted and they Recommended Palliative care consultation and changing IV antibiotics to oral doxycycline  and Augmentin  and plan for 4 weeks from 11/12/2024 to 12/10/2024 with ID follow. Hospitalization complicated by worsening Hyponatremia and Constipation so started IVF and provide Bowel Regimen.   PT/OT Recommending SNF and awaiting Insurance Approval but after further GOC discussion w/ Palliative Care and the Family, the plan is to transition the patient Home w/ Home Hospice while continuing po Abx.   Assessment and Plan:  Sepsis: Present on admission & 2/2 to foot infection and Osteomyelitis. Sepsis Physiology is improved. Blood cultures obtained and patient started empirically on antibiotics. Blood cultures no growth x5 days. Continue Antibiotics (Changed by ID) as below. WBC Trend as below as well. Now plan is for patient to Transition Home w/ Home Hospice with Coordination of his Son [name].    Left Foot Abscess/Osteomyelitis/Myositis: Noted on MRI. Podiatry and vascular surgery consulted on admission. Empiric Linezolid , Ceftriaxone  and Unasyn  started with transition to linezolid , cefepime  and metronidazole . Podiatry recommending leg amputation (AKA vs BKA).  Vascular surgery aware and will work on timing of amputation of LLE.  -Continued Linezolid , cefepime  and metronidazole  until Surgery however will discontinue and changed to oral Doxycycline  and Augmentin  per ID recommendations. Vascular Surgery to follow up 11/13/24 about the timing of BKA however family has declined BKA so will not pursue this. WBC Trend remains elevated:  Recent Labs  Lab 11/10/24 0342 11/12/24 0518 11/13/24 0204 11/14/24 1320 11/15/24 0509 11/16/24 1043 11/17/24 0531  WBC 14.5* 18.7* 17.6* 18.4* 16.8* 15.4* 15.2*  -ID Recommending p.o. doxycycline  and Augmentin  for 4 weeks of treatment; They also recommended Palliative involvement given that he needs source control; Palliative had GOC and patient desires no LLE Amputation but desires to continue Abx; Home w/ Home Hospice is now being arranged.    Right Foot Fifth Metatarsal Osteomyelitis s/p POD6 Partial fifth metatarsal base resection antibiotic beads and wound VAC application: Noted on MRI. Podiatry with plan for 5th ray resection I&D and he is postoperative day 6 with antibiotic bead application of vancomycin  tobramycin  as well as application of negative pressure wound.  However negative pressure wound did not work properly and started getting blockages and there is oozing noted from the surgical site.  The VAC was discontinued and he is now switched to saline wet-to-dry packing with wound and dry gauze 4 x 4 ABD pad Kerlix Ace wrapped with compression.  The podiatry team feels that the wound VAC is likely be reapplied later this week by the WOCN in the room consulting for wound VAC change on Friday for application and this was done -Aerobic/Anaerobic Cx showed Few Corynebacterium Jeikeium  -WBAT in Post-op Shoe on R per Podiatry; P now has a wound VAC but Hospice will not do wound Vac so  now Podiatry recommending saline wet to dry on the R Foot.   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 ABIs performed on 1/1 and are significant for moderate bilateral disease. Patient underwent angiogram on 1/2, with vascular performing left dorsalis pedis and left anterior tibial angioplasty.  -Patient started on Aspirin  325 mg po Daily and Clopidogrel  75 mg po Daily. Right lower extremity angiogram performed on 1/5 with laser arthrectomy of right anterior tibial artery and balloon angioplasty. Patient underwent Surgery as above on the R -Podiatry followed up again and applied wound VAC and recommending wound VAC for 1 to 3 months with changing twice weekly but see above to the changes in Plan of Care -Palliative care consulted for consulted for further goals of care discussion and he has no desire for LLE Amputation and will Transition to Home w/ Home Hospice  HypoNa+: Na+ Trended down to 128 and is now improving with 132 on the last check. C/w NS @ 75 mL/hr. Will NOT CTM and Trend as patient to transition Home w/ Home Hospice  HypoPhos: Phos Level is now 3.6 on last cehck. CTM & Replete as Necessary. Repeat Phos Level in the AM   Constipation: Start Senna-Docusate 1 tab po BID, Miralax  17 grams BID, and Bisacodyl  10 mg RC Suppository PRN Moderate Constipation  DMT2: Well controlled based on hemoglobin A1C of 6.5%. Continue Very Sensitive Novolog  SSI AC/HS. CTM CBGs per Protocol. CBG Trend ranging from 104-203 on the last 7 checks; Will stop checking CBG's given patient to transition Home w/ Home Hospice  BPH / Urinary Retention: Patient is s/p history of suprapubic catheter placement. Patient follows with Urology as an outpatient if so desired w/in GOC   Dysphagia: SLP Recommending Dysphagia 3 Diet with Thin Liquids after eval   Dementia: Appears to be mild, however patient developed some evidence of delirium on 1/3. Patient was on Risperdal . Patient with hyperactive and hypoactive delirium. CT head unremarkable for acute process. Unclear if episodes are related to Risperidone  so  this was discontinued.  -C/w Delirium precautions and started Atarax  25 mg TIDprn Anxiety.  Intermittently has some agitation and combativeness with behavioral disturbances  Pancreatic Mass: Had a CT abdomen pelvis urogram with and without IV contrast back in May 2025 at [facility] which showed a complex lesion involving the pancreatic body measuring upwards of 1.7 x 1.3 x 1.7 cm.  This was recommended to be further eval with pancreatic protocol MRI or EUS.  He was referred to GI and unclear if he ever followed up on this.  -Palliative care has been consulted for further goals of care discussion and they had a Long meeting with pt/family. DNR/DNI w/ no desire for further workup so will be transitioning to Home w/ Home Hospice  Microcytic Anemia: Hgb/Hct Trend slightly dropping but stable; On last check was 9.5/26.9. Checked Anemia Panel and showed an iron level of 35, UIBC 197, TIBC of 232, saturation ratios of 50%, ferritin level of 107 prolactin level of 9.1 and vitamin B12 584. CTM for S/Sx of Bleeding. No overt bleeding noted. Will NOT repeat CBC as plan is for patient to transition Home with Hospice  Thrombocytosis: Improving. In the setting of Infection. Current Plt Count Trend Improving and went from 658 -> 487 on last check. Will not CTM and Trend and repeat CBC as current plan is to transition to Home w/ Home Hospice   Hypoalbuminemia: Patient's Albumin Lvl Trend went from 3.9 x2 -> 3.5  x2 -> 3.4 -> 3.5 -> 3.3 x2. Will NOT CTM & Trend & Repeat CMP in the AM  Overweight: Complicates overall prognosis and care. Estimated body mass index is 27.35 kg/m as calculated from the following:   Height as of this encounter: 5' 8 (1.727 m).   Weight as of this encounter: 81.6 kg. Weight Loss and Dietary Counseling given   DVT prophylaxis: enoxaparin  (LOVENOX ) injection 40 mg Start: 11/14/24 2115    Code Status: Limited: Do not attempt resuscitation (DNR) -DNR-LIMITED -Do Not Intubate/DNI  Family  Communication: D/w Gilmer Real at bedside and Gilmer Mt on the telephone  Disposition Plan:  Level of care: Med-Surg Status is: Inpatient Remains inpatient appropriate because: Needs to have Home Hospice Arranged.   Consultants:  Palliative Care Medicine Vascular Surgery Podiatry  Procedures:  As delineated as above  Antimicrobials:  Anti-infectives (From admission, onward)    Start     Dose/Rate Route Frequency Ordered Stop   11/14/24 2200  doxycycline  (VIBRA -TABS) tablet 100 mg        100 mg Oral Every 12 hours 11/14/24 1606 12/11/24 0959   11/14/24 2200  amoxicillin -clavulanate (AUGMENTIN ) 875-125 MG per tablet 1 tablet        1 tablet Oral Every 12 hours 11/14/24 1606 12/11/24 0959   11/12/24 1102  tobramycin  (NEBCIN ) injection  Status:  Discontinued          As needed 11/12/24 1133 11/12/24 1134   11/12/24 1043  vancomycin  (VANCOCIN ) powder  Status:  Discontinued          As needed 11/12/24 1043 11/12/24 1115   11/11/24 2200  metroNIDAZOLE  (FLAGYL ) tablet 500 mg  Status:  Discontinued        500 mg Oral Every 12 hours 11/11/24 0943 11/14/24 1345   11/05/24 2200  linezolid  (ZYVOX ) tablet 600 mg  Status:  Discontinued       Placed in And Linked Group   600 mg Oral Every 12 hours 11/05/24 1341 11/14/24 1606   11/05/24 1445  ceFEPIme  (MAXIPIME ) 2 g in sodium chloride  0.9 % 100 mL IVPB  Status:  Discontinued        2 g 200 mL/hr over 30 Minutes Intravenous Every 12 hours 11/05/24 1350 11/14/24 1606   11/05/24 1445  metroNIDAZOLE  (FLAGYL ) IVPB 500 mg  Status:  Discontinued        500 mg 100 mL/hr over 60 Minutes Intravenous Every 12 hours 11/05/24 1352 11/11/24 0943   11/04/24 1430  Ampicillin -Sulbactam (UNASYN ) 3 g in sodium chloride  0.9 % 100 mL IVPB  Status:  Discontinued        3 g 200 mL/hr over 30 Minutes Intravenous Every 6 hours 11/04/24 1405 11/05/24 1340   11/04/24 1400  linezolid  (ZYVOX ) tablet 600 mg  Status:  Discontinued       Placed in And Linked Group   600 mg  Oral Every 12 hours 11/04/24 1355 11/05/24 1341   11/04/24 1200  cefTRIAXone  (ROCEPHIN ) 2 g in sodium chloride  0.9 % 100 mL IVPB        2 g 200 mL/hr over 30 Minutes Intravenous  Once 11/04/24 1157 11/04/24 1241       Subjective: Seen and examined at bedside and palliative team is at bedside having discussion with the family and patient.  He continues to complain of some foot pain.  No nausea or vomiting.  Adamantly does not want a left lower extremity amputation.  After further goals of care discussion the patient wants  to be transitioning home with home Hospice.  Will discontinue all lab work and have TOC help home hospice.  Objective: Vitals:   11/17/24 1926 11/18/24 0437 11/18/24 0759 11/18/24 1133  BP: 129/61 135/66 129/71 127/60  Pulse: 60 (!) 58 (!) 58 70  Resp: 16 16 20  (!) 22  Temp: 98 F (36.7 C) 97.7 F (36.5 C) 98 F (36.7 C) 98.6 F (37 C)  TempSrc: Oral Oral Axillary Oral  SpO2: 92% 99% 93% 96%  Weight:      Height:       Intake/Output Summary (Last 24 hours) at 11/18/2024 1351 Last data filed at 11/18/2024 9146 Gross per 24 hour  Intake --  Output 1950 ml  Net -1950 ml   Filed Weights   11/04/24 1400 11/12/24 0930  Weight: 81.6 kg 81.6 kg   Examination: Physical Exam:  Constitutional: Thin elderly chronically ill-appearing African-American male who appears calm and having a discussion with his son and the palliative team Respiratory: Diminished to auscultation bilaterally, no wheezing, rales, rhonchi or crackles. Normal respiratory effort and patient is not tachypenic. No accessory muscle use.  Unlabored breathing Cardiovascular: RRR, no murmurs / rubs / gallops. S1 and S2 auscultated. No extremity edema.  Abdomen: Soft, non-tender, non-distended. Bowel sounds positive.  GU: Deferred.  Suprapubic catheter noted Musculoskeletal: Right foot is wrapped and connected to wound VAC Skin: No rashes, lesions, on limited skin evaluation I did not evaluate his left  heel today. No induration; Warm and dry.  Neurologic: CN 2-12 grossly intact with no focal deficits. Romberg sign cerebellar reflexes not assessed.  Psychiatric: He is awake and alert and appears calm  Data Reviewed: I have personally reviewed following labs and imaging studies  CBC: Recent Labs  Lab 11/13/24 0204 11/14/24 1320 11/15/24 0509 11/16/24 1043 11/17/24 0531  WBC 17.6* 18.4* 16.8* 15.4* 15.2*  NEUTROABS 8.8* 9.2* 9.2* 8.1* 7.3  HGB 10.3* 11.9* 12.5* 10.9* 9.5*  HCT 29.5* 34.7* 36.4* 30.9* 26.9*  MCV 79.1* 79.6* 80.2 78.6* 78.7*  PLT 530* 556* 551* 513* 487*   Basic Metabolic Panel: Recent Labs  Lab 11/13/24 0204 11/14/24 1320 11/15/24 0509 11/16/24 1043 11/17/24 0531  NA 131* 131* 129* 128* 132*  K 4.3 5.1 5.1 4.5 4.4  CL 98 96* 95* 92* 98  CO2 26 24 24 23 25   GLUCOSE 135* 114* 100* 252* 135*  BUN 23 21 18  28* 30*  CREATININE 1.08 0.99 0.99 1.05 1.10  CALCIUM  9.5 9.4 9.1 9.3 8.8*  MG 2.1 2.1 2.2 2.2 2.0  PHOS 2.7 2.4* 2.8 2.2* 3.6   GFR: Estimated Creatinine Clearance: 45.8 mL/min (by C-G formula based on SCr of 1.1 mg/dL). Liver Function Tests: Recent Labs  Lab 11/13/24 0204 11/14/24 1320 11/15/24 0509 11/16/24 1043 11/17/24 0531  AST 19 24 28 24 19   ALT 10 12 9 12 11   ALKPHOS 57 66 63 64 52  BILITOT 0.3 0.5 0.4 0.3 0.3  PROT 7.1 7.9 7.4 7.6 6.7  ALBUMIN 3.4* 3.6 3.3* 3.5 3.3*   No results for input(s): LIPASE, AMYLASE in the last 168 hours. No results for input(s): AMMONIA in the last 168 hours. Coagulation Profile: No results for input(s): INR, PROTIME in the last 168 hours. Cardiac Enzymes: No results for input(s): CKTOTAL, CKMB, CKMBINDEX, TROPONINI in the last 168 hours. BNP (last 3 results) No results for input(s): PROBNP in the last 8760 hours. HbA1C: No results for input(s): HGBA1C in the last 72 hours. CBG: Recent Labs  Lab 11/17/24  1209 11/17/24 1721 11/17/24 2149 11/18/24 0757 11/18/24 1132  GLUCAP  203* 117* 104* 118* 174*   Lipid Profile: No results for input(s): CHOL, HDL, LDLCALC, TRIG, CHOLHDL, LDLDIRECT in the last 72 hours. Thyroid Function Tests: No results for input(s): TSH, T4TOTAL, FREET4, T3FREE, THYROIDAB in the last 72 hours. Anemia Panel: No results for input(s): VITAMINB12, FOLATE, FERRITIN, TIBC, IRON, RETICCTPCT in the last 72 hours. Sepsis Labs: No results for input(s): PROCALCITON, LATICACIDVEN in the last 168 hours.  Recent Results (from the past 240 hours)  Aerobic/Anaerobic Culture w Gram Stain (surgical/deep wound)     Status: None   Collection Time: 11/12/24 11:04 AM   Specimen: Foot, Right; Tissue  Result Value Ref Range Status   Specimen Description TISSUE  Final   Special Requests RIGHT FIFTH METATARSAL BASE RESECTION  Final   Gram Stain   Final    FEW WBC PRESENT,BOTH PMN AND MONONUCLEAR RARE GRAM POSITIVE COCCI Performed at [facility], [address], [place], [place] [address]    Culture   Final    FEW CORYNEBACTERIUM SPECIES CORYNEBACTERIUM JEIKEIUM Standardized susceptibility testing for this organism is not available. MIXED ANAEROBIC FLORA PRESENT.  CALL LAB IF FURTHER IID REQUIRED.    Report Status 11/17/2024 FINAL  Final    Radiology Studies: No results found.  Scheduled Meds:  amoxicillin -clavulanate  1 tablet Oral Q12H   aspirin  EC  81 mg Oral Daily   clopidogrel   75 mg Oral Q breakfast   doxycycline   100 mg Oral Q12H   enoxaparin  (LOVENOX ) injection  40 mg Subcutaneous Q24H   feeding supplement  237 mL Oral BID WC   insulin  aspart  0-5 Units Subcutaneous QHS   insulin  aspart  0-6 Units Subcutaneous TID WC   multivitamin with minerals  1 tablet Oral Daily   nutrition supplement (JUVEN)  1 packet Oral BID BM   polyethylene glycol  17 g Oral BID   rosuvastatin   20 mg Oral Daily   senna-docusate  1 tablet Oral BID   sodium chloride  flush  3 mL Intravenous Q12H   sodium chloride  flush   3 mL Intravenous Q12H   Continuous Infusions:  sodium chloride  75 mL/hr at 11/18/24 0445    LOS: 14 days   Alejandro Marker, DO [facility] Available via Epic secure chat 7am-7pm After these hours, please refer to coverage provider listed on [email or web address] 11/18/2024, 1:51 PM  "

## 2024-11-18 NOTE — Plan of Care (Addendum)
 Refused wound care at this time. Explained the importance of wound care, patient stated I don't want to do it right now. Care ongoing.  Problem: Nutrition: Goal: Adequate nutrition will be maintained Outcome: Not Progressing   Problem: Safety: Goal: Ability to remain free from injury will improve Outcome: Progressing

## 2024-11-18 NOTE — Progress Notes (Signed)
 Palliative:  89 y.o. male  with past medical history significant of dementia, pancreatic mass, right femur lytic lesion, glucose intolerance, hematuria, history of suprapubic catheter, prostate enlargement admitted on 11/03/2024 due to bilateral foot pain for the past 2 weeks, tenderness, edema, and erythema of the left lower extremity, particularly the left foot for the past week.  He is unable to provide much history.  He has been restless and pulled his IV out. 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  Vascular surgery and podiatry consulted.  Empiric IV antibiotics started, patient underwent angiogram with left dorsalis pedis and left anterior tibial angioplasty.  He is now status post I&D with fifth metatarsal base resection of the right foot.  Current plan is for left lower extremity below-knee amputation and he is tentatively scheduled for left BKA late a.m. on 11/14/2024 however family did not consent for amputation.  ID has not been consulted and they are recommending palliative care consultation and changing IV antibiotic to oral doxycycline  and Augmentin  and plan for 4 weeks from 11/12/2024 to 12/10/2024 with ID follow-up thereafter.  I met today at Patrick [name] bedside along with Patrick [name] and Patrick [name] was available over speaker-phone. I spent time explaining to them Patrick [name] complicated medical situation with poor blood flow to both feet, wound on right foot, and left calcaneal osteomyelitis. We reviewed complicating factors for healing including blood flow, diabetes, dementia, and nutrition. I explained why left BKA amputation is being recommended. We discussed infection deep in the bone where antibiotics are not as effective. We discussed the anticipation that this will lead to serious life-limiting infection in the absence of amputation. After explaining family report better understanding of his situation. We discussed two paths  forward. One path being amputation but this may involve significant pain and poor quality of life but with the goal to prolong life but many risks that could impact recovery. We discussed likelihood of rehab stay after amputation but anticipation he will need significant nursing care and likely be bed and wheelchair bound. We discussed the alternative path to forego amputation and focus on pain management and comfort recognizing that time may be limited. They experienced this care with their mother previously so they understand hospice and comfort care.   Patrick [name] deliberates and considers his options. He shares many stories about his family and his life shedding light on his thought process. After much discussion Patrick [name] is clear that he does not want his life prolonged in this state. He expresses concern for his sons and sons reassure him that they want to follow his wishes and want to make sure they do what is right for him - they will respect whichever decision he makes. All agree after discussed that they do not desire amputation. They do wish to maintain oral antibiotics for right foot and continuing conservative care but with a focus on comfort and quality of life. We discussed recommendation for home with hospice support and what this would look like. [name] has reservations for the amount of care his father will need but is open to discussing with TOC and hospice liaison. We did discuss barriers to facility care and home as the better option. We also discussed DNR/DNI and this is confirmed and aligned with plan of care with hospice support.   All questions/concerns addressed. Family had insightful questions. Therapeutic listening and life review for Patrick [name]. Emotional support provided.   Exam: Awake, alert. Mostly oriented  although struggles to stay focused on topic of conversation at times. He enjoys sharing stories of his past and his family. No distress. Breathing regular, unlabored. Abd  soft. R foot wrapped and clean dressing. L foot with foam dressing under sock.   Plan: - DNR/DNI - Declines amputation - Continue conservative care with wound care to R foot and oral antibiotics - Home with hospice  100 min  Patrick Kitty, NP Palliative Medicine Team Pager [phone] (Please see [email or web address] for schedule) Team Phone [phone]

## 2024-11-18 NOTE — Progress Notes (Signed)
 Occupational Therapy Treatment Patient Details Name: Patrick [name] MRN: [id number] DOB: 12/11/1934 Today's Date: 11/18/2024   History of present illness Patrick [name] is a 89 y.o. M adm 11/03/24 for sepsis d/t LLE cellulitis of LLE. 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 Pt s/p aortogram RLE runoff with laser atherectomy of the right anterior tibial artery and balloon angioplasty 1/5. Pt s/p right foot I&D with fifth metatarsal base resection, application of antibiotic beads with vancomycin  and tobramycin , and wound vac application 1/7. Plan for left BKA 1/9. PMHx: dementia, T2DM, BPH/urinary retention s/p suprapubic catheter, PVD, neuropathy, and pancreatic mass.   OT comments  Patient able to barely get right leg off the bed today before pain.  Unable to transition to edge of bed sitting.  Patient has progress with his level of alertness and ability to follow commands.  He is able to feed himself at bedlevel, and is participatory with upper body ADL in bed.  Pain management in order to progress mobility.  OT can continue efforts in the acute setting to address deficits, and Patient will benefit from continued inpatient follow up therapy, <3 hours/day, versus long term care.  Patient currently has the strength to move his legs, and has rehab potential, pain is the barrier.        If plan is discharge home, recommend the following:  Assistance with cooking/housework;Assist for transportation;A lot of help with bathing/dressing/bathroom;Two people to help with walking and/or transfers;Supervision due to cognitive status   Equipment Recommendations  None recommended by OT    Recommendations for Other Services      Precautions / Restrictions Precautions Precautions: Fall Recall of Precautions/Restrictions: Impaired Required Braces or Orthoses: Other Brace Other Brace:  R post-op shoe Restrictions Weight Bearing Restrictions Per Provider Order: Yes RLE Weight Bearing Per Provider Order: Weight bearing as tolerated       Mobility Bed Mobility Overal bed mobility: Needs Assistance Bed Mobility: Supine to Sit     Supine to sit: Total assist     General bed mobility comments: Only able to get R leg to dangle off the edge of the bed prior to pain    Transfers                         Balance                                           ADL either performed or assessed with clinical judgement   ADL   Eating/Feeding: Set up;Bed level   Grooming: Wash/dry hands;Wash/dry face;Supervision/safety;Bed level;Set up   Upper Body Bathing: Minimal assistance;Bed level   Lower Body Bathing: Total assistance;Bed level   Upper Body Dressing : Minimal assistance;Bed level   Lower Body Dressing: Maximal assistance;Total assistance;Bed level   Toilet Transfer: +2 for physical assistance                  Extremity/Trunk Assessment Upper Extremity Assessment Upper Extremity Assessment: Overall WFL for tasks assessed   Lower Extremity Assessment Lower Extremity Assessment: Defer to PT evaluation        Vision   Vision Assessment?: No apparent visual deficits   Perception Perception Perception: Not tested   Praxis Praxis Praxis: Not tested   Communication Communication Communication: Impaired  Factors Affecting Communication: Reduced clarity of speech   Cognition Arousal: Alert Behavior During Therapy: Lability, Restless, Anxious Cognition: Cognition impaired   Orientation impairments: Place, Time, Situation Awareness: Intellectual awareness impaired, Online awareness impaired Memory impairment (select all impairments): Short-term memory, Working memory Attention impairment (select first level of impairment): Focused attention Executive functioning impairment (select all impairments): Initiation,  Organization, Sequencing, Reasoning, Problem solving                   Following commands: Impaired Following commands impaired: Follows one step commands inconsistently      Cueing   Cueing Techniques: Verbal cues, Gestural cues  Exercises      Shoulder Instructions       General Comments  VSS    Pertinent Vitals/ Pain       Pain Assessment Pain Assessment: Faces Faces Pain Scale: Hurts whole lot Pain Location: RLE/foot Pain Descriptors / Indicators: Grimacing, Guarding Pain Intervention(s): Premedicated before session                                                          Frequency  Min 2X/week        Progress Toward Goals  OT Goals(current goals can now be found in the care plan section)  Progress towards OT goals: Goals updated  Acute Rehab OT Goals OT Goal Formulation: With patient Time For Goal Achievement: 11/27/24 Potential to Achieve Goals: Fair ADL Goals Pt Will Perform Lower Body Bathing: with mod assist;sitting/lateral leans Pt Will Perform Lower Body Dressing: with mod assist;sitting/lateral leans Pt Will Transfer to Toilet: with mod assist;with +2 assist;bedside commode;stand pivot transfer  Plan      Co-evaluation                 AM-PAC OT 6 Clicks Daily Activity     Outcome Measure   Help from another person eating meals?: A Little   Help from another person toileting, which includes using toliet, bedpan, or urinal?: A Lot Help from another person bathing (including washing, rinsing, drying)?: A Lot Help from another person to put on and taking off regular upper body clothing?: A Little Help from another person to put on and taking off regular lower body clothing?: Total 6 Click Score: 11    End of Session    OT Visit Diagnosis: Unsteadiness on feet (R26.81);Muscle weakness (generalized) (M62.81);Other symptoms and signs involving cognitive function;Pain Pain - part of body: Ankle and joints  of foot   Activity Tolerance Patient limited by pain   Patient Left in bed;with call bell/phone within reach;with bed alarm set   Nurse Communication Mobility status        Time: 9095-9078 OT Time Calculation (min): 17 min  Charges: OT General Charges $OT Visit: 1 Visit OT Treatments $Therapeutic Activity: 8-22 mins  11/18/2024  RP, OTR/L  Acute Rehabilitation Services  Office:  [phone]   Charlie JONETTA Halsted 11/18/2024, 9:26 AM

## 2024-11-19 ENCOUNTER — Other Ambulatory Visit (HOSPITAL_COMMUNITY): Payer: Self-pay

## 2024-11-19 MED ORDER — POLYETHYLENE GLYCOL 3350 17 GM/SCOOP PO POWD
17.0000 g | Freq: Two times a day (BID) | ORAL | 0 refills | Status: AC
  Filled 2024-11-19: qty 238, 7d supply, fill #0

## 2024-11-19 MED ORDER — DOXYCYCLINE HYCLATE 100 MG PO TABS
100.0000 mg | ORAL_TABLET | Freq: Two times a day (BID) | ORAL | 0 refills | Status: AC
  Filled 2024-11-19: qty 45, 23d supply, fill #0

## 2024-11-19 MED ORDER — ROSUVASTATIN CALCIUM 20 MG PO TABS
20.0000 mg | ORAL_TABLET | Freq: Every day | ORAL | 0 refills | Status: AC
  Filled 2024-11-19: qty 30, 30d supply, fill #0

## 2024-11-19 MED ORDER — ZINC SULFATE 220 (50 ZN) MG PO TABS
220.0000 mg | ORAL_TABLET | Freq: Every day | ORAL | 0 refills | Status: AC
  Filled 2024-11-19: qty 14, 14d supply, fill #0

## 2024-11-19 MED ORDER — OXYCODONE-ACETAMINOPHEN 5-325 MG PO TABS
1.0000 | ORAL_TABLET | ORAL | 0 refills | Status: AC | PRN
  Filled 2024-11-19: qty 10, 2d supply, fill #0

## 2024-11-19 MED ORDER — ZINC SULFATE 220 (50 ZN) MG PO CAPS
220.0000 mg | ORAL_CAPSULE | Freq: Every day | ORAL | Status: DC
  Administered 2024-11-19: 220 mg via ORAL
  Filled 2024-11-19: qty 1

## 2024-11-19 MED ORDER — SENNOSIDES-DOCUSATE SODIUM 8.6-50 MG PO TABS
2.0000 | ORAL_TABLET | Freq: Two times a day (BID) | ORAL | 0 refills | Status: AC
  Filled 2024-11-19: qty 14, 4d supply, fill #0

## 2024-11-19 MED ORDER — CLOPIDOGREL BISULFATE 75 MG PO TABS
75.0000 mg | ORAL_TABLET | Freq: Every day | ORAL | 0 refills | Status: AC
  Filled 2024-11-19: qty 30, 30d supply, fill #0

## 2024-11-19 MED ORDER — AMOXICILLIN-POT CLAVULANATE 875-125 MG PO TABS
1.0000 | ORAL_TABLET | Freq: Two times a day (BID) | ORAL | 0 refills | Status: AC
  Filled 2024-11-19: qty 45, 23d supply, fill #0

## 2024-11-19 MED ORDER — VITAMIN C 500 MG PO TABS
500.0000 mg | ORAL_TABLET | Freq: Two times a day (BID) | ORAL | Status: DC
  Administered 2024-11-19: 500 mg via ORAL
  Filled 2024-11-19: qty 1

## 2024-11-19 MED ORDER — ASCORBIC ACID 500 MG PO TABS
500.0000 mg | ORAL_TABLET | Freq: Two times a day (BID) | ORAL | 0 refills | Status: AC
  Filled 2024-11-19: qty 14, 7d supply, fill #0

## 2024-11-19 NOTE — Plan of Care (Signed)
" °  Problem: Education: Goal: Knowledge of General Education information will improve Description: Including pain rating scale, medication(s)/side effects and non-pharmacologic comfort measures Outcome: Progressing   Problem: Health Behavior/Discharge Planning: Goal: Ability to manage health-related needs will improve Outcome: Progressing   Problem: Clinical Measurements: Goal: Ability to maintain clinical measurements within normal limits will improve Outcome: Progressing Goal: Will remain free from infection Outcome: Progressing Goal: Diagnostic test results will improve Outcome: Progressing Goal: Respiratory complications will improve Outcome: Progressing Goal: Cardiovascular complication will be avoided Outcome: Progressing   Problem: Activity: Goal: Risk for activity intolerance will decrease Outcome: Progressing   Problem: Nutrition: Goal: Adequate nutrition will be maintained Outcome: Progressing   Problem: Coping: Goal: Level of anxiety will decrease Outcome: Progressing   Problem: Elimination: Goal: Will not experience complications related to bowel motility Outcome: Progressing Goal: Will not experience complications related to urinary retention Outcome: Progressing   Problem: Pain Managment: Goal: General experience of comfort will improve and/or be controlled Outcome: Progressing   Problem: Safety: Goal: Ability to remain free from injury will improve Outcome: Progressing   Problem: Skin Integrity: Goal: Risk for impaired skin integrity will decrease Outcome: Progressing   Problem: Education: Goal: Ability to describe self-care measures that may prevent or decrease complications (Diabetes Survival Skills Education) will improve Outcome: Progressing Goal: Individualized Educational Video(s) Outcome: Progressing   Problem: Coping: Goal: Ability to adjust to condition or change in health will improve Outcome: Progressing   Problem: Fluid  Volume: Goal: Ability to maintain a balanced intake and output will improve Outcome: Progressing   Problem: Health Behavior/Discharge Planning: Goal: Ability to identify and utilize available resources and services will improve Outcome: Progressing Goal: Ability to manage health-related needs will improve Outcome: Progressing   Problem: Metabolic: Goal: Ability to maintain appropriate glucose levels will improve Outcome: Progressing   Problem: Nutritional: Goal: Maintenance of adequate nutrition will improve Outcome: Progressing Goal: Progress toward achieving an optimal weight will improve Outcome: Progressing   Problem: Skin Integrity: Goal: Risk for impaired skin integrity will decrease Outcome: Progressing   Problem: Tissue Perfusion: Goal: Adequacy of tissue perfusion will improve Outcome: Progressing   Problem: Education: Goal: Understanding of CV disease, CV risk reduction, and recovery process will improve Outcome: Progressing Goal: Individualized Educational Video(s) Outcome: Progressing   Problem: Activity: Goal: Ability to return to baseline activity level will improve Outcome: Progressing   Problem: Cardiovascular: Goal: Ability to achieve and maintain adequate cardiovascular perfusion will improve Outcome: Progressing Goal: Vascular access site(s) Level 0-1 will be maintained Outcome: Progressing   Problem: Health Behavior/Discharge Planning: Goal: Ability to safely manage health-related needs after discharge will improve Outcome: Progressing   Problem: Education: Goal: Knowledge of the prescribed therapeutic regimen will improve Outcome: Progressing   Problem: Bowel/Gastric: Goal: Gastrointestinal status for postoperative course will improve Outcome: Progressing   Problem: Cardiac: Goal: Ability to maintain an adequate cardiac output Outcome: Progressing Goal: Will show no evidence of cardiac arrhythmias Outcome: Progressing   Problem:  Nutritional: Goal: Will attain and maintain optimal nutritional status Outcome: Progressing   Problem: Neurological: Goal: Will regain or maintain usual level of consciousness Outcome: Progressing   Problem: Clinical Measurements: Goal: Ability to maintain clinical measurements within normal limits Outcome: Progressing Goal: Postoperative complications will be avoided or minimized Outcome: Progressing   Problem: Respiratory: Goal: Will regain and/or maintain adequate ventilation Outcome: Progressing Goal: Respiratory status will improve Outcome: Progressing   "

## 2024-11-19 NOTE — TOC Transition Note (Signed)
 Transition of Care [facility]) - Discharge Note  Patient Details  Name: Patrick [name] MRN: [id number] Date of Birth: [birth year]  [facility] CM/SW Contact:  Nena LITTIE Coffee, RN Phone Number: 11/19/2024, 12:36 PM  Clinical Narrative:    ICM/TOC consulted for home hospice referral. CM spoke c/pt at bedside, pt has no agency preference. Scientist, Clinical (histocompatibility And Immunogenetics) liaison, Eleanor RAMAN., accepted referral and is coordinating discharge c/pt's family. AVS updated.   Final next level of care: Home w Hospice Care Barriers to Discharge: Barriers Resolved  Patient Goals and CMS Choice Patient states their goals for this hospitalization and ongoing recovery are:: Return home c/family CMS Medicare.gov Compare Post Acute Care list provided to:: Patient Choice offered to / list presented to : Patient  Social Drivers of Health (SDOH) Interventions SDOH Screenings   Food Insecurity: Patient Unable To Answer (11/05/2024)  Housing: Low Risk (03/18/2024)   Received from [facility]  Transportation Needs: Patient Unable To Answer (11/05/2024)  Utilities: Patient Unable To Answer (11/05/2024)  Financial Resource Strain: Low Risk (03/18/2024)   Received from [facility]  Physical Activity: Unknown (03/18/2024)   Received from [facility]  Social Connections: Patient Unable To Answer (11/05/2024)  Stress: No Stress Concern Present (03/18/2024)   Received from [facility]  Tobacco Use: Medium Risk (11/08/2024)   Readmission Risk Interventions     No data to display

## 2024-11-19 NOTE — Discharge Summary (Signed)
 Physician Discharge Summary  Patrick [name] FMW:[id number] DOB: 1934/11/16 DOA: 11/03/2024  PCP: Maree Leni Edyth DELENA, MD  Admit date: 11/03/2024 Discharge date: 11/19/2024  Admitted From: Home Disposition:  Home  Discharge Condition:Stable CODE STATUS: DNR Diet recommendation: Heart Healthy   Brief/Interim Summary: Patrick [name] is a 89 y.o. male with a history of dementia, DMT2, BPH/urinary retention s/p suprapubic catheter, pancreatic mass. Patient presented secondary to bilateral foot pain and swelling and found to have evidence of L calcaneous osteomyelitis and R 5th metatarsal osteomyelitis.  Vascular surgery, podiatry consulted, antibiotics started.Patient underwent angiogram with left dorsalis pedis and left anterior tibial angioplasty. He is now status post I&D with fifth metatarsal base resection of the right foot. Recommendation was for LLE amputation but patient and family refused. ID consulted and they Recommended Palliative care consultation and changing IV antibiotics to oral doxycycline  and Augmentin  and plan for 4 weeks from 11/12/2024 to 12/10/2024 with ID follow.  PT recommending SNF and was awaiting insurance approval but after goals of care discussion with family by palliative care, family decided to take him  home with hospice.  Plan for discharge to home with hospice services   Following problems were addressed during the hospitalization:   Sepsis secondary to left foot abscess/osteomyelitis/myositis: Sepsis physiology has resolved.  Initially started on IV antibiotics. Vascular surgery, podiatry consulted, antibiotics started.Patient underwent angiogram with left dorsalis pedis and left anterior tibial angioplasty. He is now status post I&D with fifth metatarsal base resection of the right foot. Recommendation was for LLE amputation but patient and family refused. ID consulted and they Recommended Palliative care consultation and changing IV antibiotics to oral  doxycycline  and Augmentin  and plan for 4 weeks from 11/12/2024 to 12/10/2024 with ID follow.  One of the blood culture 6 showed few Corynebacterium species Podiatry recommended weightbearing as tolerated in postop shoe.  Recommended saline wet-to-dry on the right foot   Critical limb ischemia bilateral lower extremities: Vascular surgery was following.  ABI showed moderate bilateral disease.  He started on aspirin , Plavix .Right lower extremity angiogram performed on 1/5 with laser arthrectomy of right anterior tibial artery and balloon angioplasty. Patient underwent Surgery as above   Was recommended left lower extremity amputation but patient has declined.   Constipation: Continue bowel regimen   Diabetes type 2: Recent A1c of 6.5.  Takes metformin at home   Dysphagia: Currently on dysphagia 3 diet.   Dementia: Lives with family.  CT head negative for acute findings.  Continue delirium precaution, frequent reorientation.  Intermittently has some episodes of agitation, combativeness   History of pancreatic mass:Had a CT abdomen pelvis urogram with and without IV contrast back in May 2025 at [facility] which showed a complex lesion involving the pancreatic body measuring upwards of 1.7 x 1.3 x 1.7 cm..  He was referred to GI.  Now planning for continue hospice care at home   Microcytic anemia: Currently hemoglobin stable  Discharge Diagnoses:  Principal Problem:   Sepsis due to cellulitis [facility]) Active Problems:   Thrombocytosis   Hyponatremia   Hyperproteinemia   Normocytic anemia   Dementia with behavioral disturbance (HCC)   Subacute osteomyelitis of right foot (HCC)   Pyogenic inflammation of bone (HCC)   Goals of care, counseling/discussion   Palliative care by specialist   Pressure injury of skin    Discharge Instructions  Discharge Instructions     Diet general   Complete by: As directed    Dysphagia 3 diet   Discharge instructions  Complete by: As directed    1)Please follow  up with hospice services at home   Discharge wound care:   Complete by: As directed    As per wound care nurse   Increase activity slowly   Complete by: As directed       Allergies as of 11/19/2024   No Known Allergies      Medication List     TAKE these medications    amoxicillin -clavulanate 875-125 MG tablet Commonly known as: AUGMENTIN  Take 1 tablet by mouth every 12 (twelve) hours for 45 doses.   ascorbic acid  500 MG tablet Commonly known as: VITAMIN C  Take 1 tablet (500 mg total) by mouth 2 (two) times daily for 7 days.   aspirin  EC 81 MG tablet Take 81 mg by mouth daily. Swallow whole.   clopidogrel  75 MG tablet Commonly known as: PLAVIX  Take 1 tablet (75 mg total) by mouth daily with breakfast. Start taking on: November 20, 2024   doxycycline  100 MG tablet Commonly known as: VIBRA -TABS Take 1 tablet (100 mg total) by mouth every 12 (twelve) hours for 45 doses.   metFORMIN 1000 MG tablet Commonly known as: GLUCOPHAGE Take 1,000 mg by mouth 2 (two) times daily.   oxyCODONE -acetaminophen  5-325 MG tablet Commonly known as: PERCOCET/ROXICET Take 1 tablet by mouth every 4 (four) hours as needed for severe pain (pain score 7-10).   polyethylene glycol 17 g packet Commonly known as: MIRALAX  / GLYCOLAX  Take 17 g by mouth 2 (two) times daily.   rosuvastatin  20 MG tablet Commonly known as: CRESTOR  Take 1 tablet (20 mg total) by mouth daily. Start taking on: November 20, 2024   senna-docusate 8.6-50 MG tablet Commonly known as: Senokot-S Take 2 tablets by mouth 2 (two) times daily.   zinc  sulfate (50mg  elemental zinc ) 220 (50 Zn) MG capsule Take 1 capsule (220 mg total) by mouth daily.               Discharge Care Instructions  (From admission, onward)           Start     Ordered   11/19/24 0000  Discharge wound care:       Comments: As per wound care nurse   11/19/24 1138            Follow-up Information     Maree Leni Edyth DELENA, MD  Follow up.   Specialty: Family Medicine Contact information: [address] [place] [address] [phone]                Allergies[1]  Consultations: Palliative care, podiatry, vascular surgery, ID   Procedures/Studies: DG Foot 2 Views Right Result Date: 11/12/2024 EXAM: 1 OR 2 VIEW(S) XRAY OF THE _LATERALITY_ FOOT 11/12/2024 12:25:04 PM COMPARISON: MRI 11/05/2024. CLINICAL HISTORY: Post-operative state. FINDINGS: BONES AND JOINTS: Antibiotic beads in place at the site of the resected 5th metatarsal base. No malalignment. SOFT TISSUES: Vacuum wound apparatus noted. Arterial calcifications. IMPRESSION: 1. Antibiotic beads at the resected fifth metatarsal base with a vacuum wound apparatus in place. 2. Chronic arterial calcifications. Electronically signed by: Ryan Salvage MD MD 11/12/2024 04:18 PM EST RP Workstation: [id number]   PERIPHERAL VASCULAR CATHETERIZATION Result Date: 11/10/2024 Images from the original result were not included. 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 Sheree, MD Procedure Performed: 1.  Percutaneous ultrasound-guided cannulation left common femoral  artery with Mynx device closure 2.  Catheter selection of aorta and aortogram and right lower extremity angiography 3.  Laser athrectomy right anterior tibial artery with 1.9 mm [place] and plain balloon angioplasty with 3 mm coyote Indications: 89 year old male with bilateral lower extremity ulceration on the left side he has osteomyelitis of the posterior inferior calcaneus and on the right side he has lateral midfoot fifth metatarsal base osteomyelitis.  He has now undergone angiography with treatment of the left anterior tibial artery and has a palpable pulse and is indicated for right lower extremity angiography.  Findings: The aorta and bilateral common iliac external iliac arteries were patent.  The right common femoral artery and SFA were patent to the popliteal artery.  At the tibial trifurcation the anterior tibial artery is heavily diseased for approximately 2.5 cm with 1 area of subtotal occlusion and reconstitutes with brisk flow to the foot.  The tibioperoneal trunk is initially patent peroneal artery is heavily diseased and then frankly occludes in the posterior tibial artery is initially not identified but reconstitutes diminutive vessel at the ankle.  After treatment of the anterior tibial artery there is brisk flow with no residual stenosis or dissection and there is a palpable dorsalis pedis pulse at the foot. Patient is optimized from a vascular standpoint.  Procedure:  The patient was identified in the holding area and taken to room 8.  The patient was then placed supine on the table and prepped and draped in the usual sterile fashion.  A time out was called.  Ultrasound was used to evaluate the left common femoral artery which was noted to be large and patent.  The area was anesthetized 1% lidocaine  and cannulated with a micropuncture needle followed by wire and a sheath.  An ultrasound image was saved to the permanent record.  We placed a Bentson wire followed by a 5 French sheath and an Omni catheter was placed at the level of L1 and aortogram was performed followed by crossing the bifurcation and performing right lower extremity angiography.  We then placed the Glidewire advantage followed by a quick cross catheter to the level of the knee and performed additional angiographic views.  A long 6 French sheath was then placed and the patient was fully heparinized.  Using V18 wire and CXI catheter we crossed the stenosis and subtotally occluded proximal anterior tibial artery and confirmed intraluminal access.  We then began with laser atherectomy using a 1.9 mm laser and then primarily performed balloon  angioplasty at nominal pressure for 2 minutes with a 3 mm coyote.  Completion demonstrated no residual stenosis or dissection with brisk flow via the anterior tibial artery and ultimately he had a palpable dorsalis pedis pulse in the foot.  We then exchanged for a short 6 French sheath in the left groin and deployed a minx device.  He tolerated the procedure without Ameeth complication. Contrast: 45cc Brandon C. Sheree, MD [facility] Office: [phone] Pager: [phone]   CT HEAD WO CONTRAST ( ) Result Date: 11/09/2024 EXAM: CT HEAD WITHOUT 11/09/2024 07:58:34 PM TECHNIQUE: CT of the head was performed without the administration of intravenous contrast. Automated exposure control, iterative reconstruction, and/or weight based adjustment of the mA/kV was utilized to reduce the radiation dose to as low as reasonably achievable. COMPARISON: None available. CLINICAL HISTORY: Mental status change, unknown cause FINDINGS: BRAIN AND VENTRICLES: No acute intracranial hemorrhage. No mass effect or midline shift. No extra-axial fluid collection. No evidence of acute infarct.  No hydrocephalus. Moderate patchy and confluent white matter hypoattenuation, compatible with chronic microvascular ischemic change. ORBITS: No acute abnormality. SINUSES AND MASTOIDS: No acute abnormality. SOFT TISSUES AND SKULL: No acute skull fracture. No acute soft tissue abnormality. IMPRESSION: 1. No acute intracranial abnormality. 2. Moderate chronic microvascular ischemic disease. Electronically signed by: Gilmore Molt 11/09/2024 10:04 PM EST RP Workstation: [id number]   VAS US  LOWER EXTREMITY VENOUS (DVT) Result Date: 11/08/2024  Lower Venous DVT Study Patient Name:  Patrick [name]  Date of Exam:   11/07/2024 Medical Rec #: [id number]           Accession #:    [id number] Date of Birth: 05-Dec-1934           Patient Gender: M Patient Age:   72 years Exam Location:  [facility] Procedure:      VAS  US  LOWER EXTREMITY VENOUS (DVT) Referring Phys: ERIC AUSTRIA --------------------------------------------------------------------------------  Indications: Edema.  Risk Factors: None identified. Limitations: Patient positioning and poor ultrasound/tissue interface. Comparison Study: No prior studies. Performing Technologist: Cordella Collet RVT  Examination Guidelines: A complete evaluation includes B-mode imaging, spectral Doppler, color Doppler, and power Doppler as needed of all accessible portions of each vessel. Bilateral testing is considered an integral part of a complete examination. Limited examinations for reoccurring indications may be performed as noted. The reflux portion of the exam is performed with the patient in reverse Trendelenburg.  +---------+---------------+---------+-----------+----------+--------------+ RIGHT    CompressibilityPhasicitySpontaneityPropertiesThrombus Aging +---------+---------------+---------+-----------+----------+--------------+ CFV      Full           Yes      Yes                                 +---------+---------------+---------+-----------+----------+--------------+ SFJ      Full                                                        +---------+---------------+---------+-----------+----------+--------------+ FV Prox  Full                                                        +---------+---------------+---------+-----------+----------+--------------+ FV Mid   Full                                                        +---------+---------------+---------+-----------+----------+--------------+ FV DistalFull                                                        +---------+---------------+---------+-----------+----------+--------------+ PFV      Full                                                        +---------+---------------+---------+-----------+----------+--------------+  POP      Full           Yes      Yes                                  +---------+---------------+---------+-----------+----------+--------------+ PTV      Full                                                        +---------+---------------+---------+-----------+----------+--------------+ PERO     Full                                                        +---------+---------------+---------+-----------+----------+--------------+   +---------+---------------+---------+-----------+----------+--------------+ LEFT     CompressibilityPhasicitySpontaneityPropertiesThrombus Aging +---------+---------------+---------+-----------+----------+--------------+ CFV      Full           Yes      Yes                                 +---------+---------------+---------+-----------+----------+--------------+ SFJ      Full                                                        +---------+---------------+---------+-----------+----------+--------------+ FV Prox  Full                                                        +---------+---------------+---------+-----------+----------+--------------+ FV Mid   Full                                                        +---------+---------------+---------+-----------+----------+--------------+ FV DistalFull                                                        +---------+---------------+---------+-----------+----------+--------------+ PFV      Full                                                        +---------+---------------+---------+-----------+----------+--------------+ POP      Full           Yes      Yes                                 +---------+---------------+---------+-----------+----------+--------------+  PTV      Full                                                        +---------+---------------+---------+-----------+----------+--------------+ PERO     Full                                                         +---------+---------------+---------+-----------+----------+--------------+     Summary: RIGHT: - There is no evidence of deep vein thrombosis in the lower extremity.  - No cystic structure found in the popliteal fossa.  LEFT: - There is no evidence of deep vein thrombosis in the lower extremity.  - No cystic structure found in the popliteal fossa.  *See table(s) above for measurements and observations. Electronically signed by Penne Colorado MD on 11/08/2024 at 10:41:17 AM.    Final    VAS US  ABI WITH/WO TBI Result Date: 11/08/2024  LOWER EXTREMITY DOPPLER STUDY Patient Name:  Patrick [name]  Date of Exam:   11/06/2024 Medical Rec #: [id number]           Accession #:    [id number] Date of Birth: 1934-12-05           Patient Gender: M Patient Age:   78 years Exam Location:  [facility] Procedure:      VAS US  ABI WITH/WO TBI Referring Phys: PENNE COLORADO --------------------------------------------------------------------------------  Indications: Peripheral artery disease. High Risk Factors: Past history of smoking.  Comparison Study: No previous exams Performing Technologist: Leigh Rom RVT/RDMS  Examination Guidelines: A complete evaluation includes at minimum, Doppler waveform signals and systolic blood pressure reading at the level of bilateral brachial, anterior tibial, and posterior tibial arteries, when vessel segments are accessible. Bilateral testing is considered an integral part of a complete examination. Photoelectric Plethysmograph (PPG) waveforms and toe systolic pressure readings are included as required and additional duplex testing as needed. Limited examinations for reoccurring indications may be performed as noted.  ABI Findings: +---------+------------------+-----+----------+--------+ Right    Rt Pressure (mmHg)IndexWaveform  Comment  +---------+------------------+-----+----------+--------+ Brachial 148                    triphasic           +---------+------------------+-----+----------+--------+ PTA      105               0.71 monophasic         +---------+------------------+-----+----------+--------+ DP       85                0.57 monophasic         +---------+------------------+-----+----------+--------+ Great Toe50                0.34 Abnormal           +---------+------------------+-----+----------+--------+ +---------+------------------+-----+----------+-------+ Left     Lt Pressure (mmHg)IndexWaveform  Comment +---------+------------------+-----+----------+-------+ Brachial 140                    triphasic         +---------+------------------+-----+----------+-------+ PTA  monophasic      +---------+------------------+-----+----------+-------+ DP       111               0.75 monophasic        +---------+------------------+-----+----------+-------+ Great Toe0                 0.00 Absent            +---------+------------------+-----+----------+-------+  Unable to tolerate cuff inflation for PTA pressure.  Summary: Right: Resting right ankle-brachial index indicates moderate right lower extremity arterial disease. The right toe-brachial index is abnormal.  Left: Resting left ankle-brachial index indicates moderate left lower extremity arterial disease. The left toe-brachial index is abnormal.  *See table(s) above for measurements and observations.  Electronically signed by Penne Colorado MD on 11/08/2024 at 10:38:23 AM.    Final    PERIPHERAL VASCULAR CATHETERIZATION Result Date: 11/07/2024 DATE OF SERVICE: 11/07/2024  PATIENT:  Patrick [name]  89 y.o. male  PRE-OPERATIVE DIAGNOSIS:  Atherosclerosis of native arteries of left lower extremity causing heel ulceration  POST-OPERATIVE DIAGNOSIS:  Same  PROCEDURE:  1) Ultrasound guided right common femoral artery access 2) Aortogram 3) Left lower extremity angiogram with second order cannulation 4) Additional left lower  extremity angiogram with third order cannulation 5) Left dorsalis pedis angioplasty (3x160mm Sterling) 6) Left anterior tibial angioplasty (3x162mm Sterling) 7) Conscious sedation (37 minutes)  SURGEON:  Debby SAILOR. Magda, MD  ASSISTANT: none  ANESTHESIA:   local and IV sedation  ESTIMATED BLOOD LOSS: min  LOCAL MEDICATIONS USED:  LIDOCAINE   COUNTS: confirmed correct.  PATIENT DISPOSITION:  PACU - hemodynamically stable.  Delay start of Pharmacological VTE agent (>24hrs) due to surgical blood loss or risk of bleeding: no  INDICATION FOR PROCEDURE: Patrick [name] is a 89 y.o. male with bilateral lower extremity ischemic wounds that are limb threatening. After careful discussion of risks, benefits, and alternatives the patient was offered angiogram. The patient understood and wished to proceed.  OPERATIVE FINDINGS:  Aortogram: Renal arteries Left: patent      Right: patent Infrarenal aorta: patent Common iliac arteries: Left: patent      Right: patent Internal iliac arteries: Left: patent      Right: patent External iliac arteries: Left: patent      Right: patent  Left Lower Extremity Angiogram:             Common femoral artery: patent             Profunda femoris artery: patent             Superficial femoral artery: patent             Popliteal artery: patent             Anterior tibial artery: heavily diseased. Multifocal subtotal occlusion and calcification. Complex lesion.             Tibioperoneal trunk: patent             Peroneal artery: heavily diseased; patent proximally but then occludes shortly after origin             Posterior tibial artery: occluded             Pedal circulation: Stenosis noted in the dorsalis pedis artery -similar to anterior tibial artery with calcification and severe stenosis (greatest 70%).  Pedal arch not intact; pedal flow otherwise mildly disadvantaged  GLASS score. FP: 0. IP: 3  WIfI score. Wound: 3; ischemia: 3; infection: 2.  Stage: 4  DESCRIPTION OF PROCEDURE: After  identification of the patient in the pre-operative holding area, the patient was transferred to the operating room. The patient was positioned supine on the operating room table.  Anesthesia was induced. The groins was prepped and draped in standard fashion. A surgical pause was performed confirming correct patient, procedure, and operative location.  The right groin was anesthetized with subcutaneous injection of 1% lidocaine . Using ultrasound guidance, the right common femoral artery was accessed with micropuncture technique.  Fluoroscopy was used to confirm cannulation over the femoral head. The 60F micropuncture sheath was upsized to 13F.  A Benson wire was advanced into the distal aorta. Over the wire an omni flush catheter was advanced to the level of L2. Aortogram was performed - see above for details.  The left common iliac artery was selected with an omniflush catheter and versacore guidewire. The wire was advanced into the common femoral artery. Over the wire the omni flush catheter was advanced into the external iliac artery. Selective angiography was performed - see above for details.  The decision was made to intervene. The patient was heparinized with 8000 units of heparin . The 13F sheath was exchanged for a 13F x 90cm sheath. Selective angiography of the left lower extremity performed prior to intervention from the popliteal artery.  Complex lesion was encountered in the anterior tibial artery and the dorsalis pedis artery.  The lesions were crossed with a V18 wire.  The lesions were treated with: Angioplasty with 3 x 150 mm balloon to the dorsalis pedis artery and the anterior tibial artery.  Completion angiography revealed: Resolution of stenosis and inline flow to the foot.  A Mynx was used to close the arteriotomy. Hemostasis was excellent upon completion.  Conscious sedation was administered with the use of IV fentanyl  and midazolam  under continuous physician and nurse monitoring.  Heart rate, blood  pressure, and oxygen saturation were continuously monitored.  Total sedation time was 37 minutes  Upon completion of the case instrument and sharps counts were confirmed correct. The patient was transferred to the PACU in good condition. I was present for all portions of the procedure.  PLAN: Aspirin , Plavix , statin therapies.  His left lower extremity is optimized.  Right lower extremity angiogram on Monday to evaluate and treat chronic limb threatening ischemia.  Debby SAILOR. Magda, MD [facility] Phone Number: [phone] 11/07/2024 9:49 AM   MR HEEL LEFT WO CONTRAST Result Date: 11/05/2024 CLINICAL DATA:  Left heel wound with swelling and pain. Concern for osteomyelitis and abscess. EXAM: MR OF THE LEFT HEEL WITHOUT CONTRAST TECHNIQUE: Multiplanar, multisequence MR imaging of the left heel was performed. No intravenous contrast was administered. COMPARISON:  Radiographs dated 11/03/2024. FINDINGS: Bones/Joint/Cartilage Soft tissue wound extending along the medial heel. There is a 2.9 x 2.8 x 0.7 cm heterogenous T2 hyperintense fluid collection, most compatible with an abscess, extending along the medial undersurface of the posterior calcaneus and the origin of the central cord of the plantar fascia. This collection demonstrates tapering extension towards the overlying soft tissue wound at the medial heel, which could reflect early sinus tract formation. Marrow edema and T1 hypointensity of the underlying posterior inferior calcaneus is compatible with osteomyelitis. Mild degenerative changes throughout the hindfoot and midfoot. No joint effusion. Ligaments Medial and lateral ankle ligaments are intact. Muscles and Tendons Flexor, peroneal and extensor compartment tendons are intact. Increased T2 signal of the intrinsic foot musculature may reflect chronic denervation changes,  however, myositis can not be excluded. Soft tissue Diffuse subcutaneous and soft tissue  edema of the ankle. Subcutaneous edema extends along the dorsal hindfoot and midfoot. IMPRESSION: 1. Soft tissue wound extending along the medial heel. There is a 2.9 x 2.8 x 0.7 cm complex fluid collection most compatible with an abscess extending along the medial undersurface of the posterior calcaneus and the origin of the central cord of the plantar fascia. Possible early sinus tract formation to the overlying soft tissue wound at the medial heel. 2. 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 3. Increased signal of the intrinsic foot musculature may reflect chronic denervation changes, however, myositis can not be excluded. 4. Diffuse subcutaneous and soft tissue edema of the ankle. Subcutaneous edema extends along the dorsal hindfoot and midfoot. Electronically Signed   By: Harrietta Sherry M.D.   On: 11/05/2024 11:20   MR FOOT RIGHT WO CONTRAST Result Date: 11/05/2024 CLINICAL DATA:  Ulceration at the lateral midfoot. Concern for osteomyelitis. EXAM: MRI OF THE RIGHT FOREFOOT WITHOUT CONTRAST TECHNIQUE: Multiplanar, multisequence MR imaging of the right foot was performed. No intravenous contrast was administered. COMPARISON:  None Available. FINDINGS: Bones/Joint/Cartilage Ulceration at the lateral midfoot overlying the level of the fifth metatarsal base. There is marrow edema of the underlying lateral half of the fifth metatarsal base without evidence of significant corresponding confluent T1 hypointensity. These findings could reflect early/mild osteomyelitis versus reactive marrow changes. Mild degenerative changes throughout the forefoot and midfoot. No joint effusion. Ligaments Collateral ligaments are intact.  Lisfranc ligament is intact. Muscles and Tendons Increased signal of the intrinsic forefoot musculature may reflect chronic denervation changes, however, myositis can not be excluded. No appreciable acute tendinous abnormality. Soft tissue No loculated fluid collection.  Subcutaneous edema of the dorsal forefoot. IMPRESSION: 1. Ulceration at the lateral midfoot overlying the fifth metatarsal base. Marrow edema of the underlying lateral half of the fifth metatarsal base could reflect early/mild osteomyelitis versus reactive marrow changes. 2. No fluid collection. 3. Increased signal of the intrinsic forefoot musculature may reflect chronic denervation changes, however, myositis can not be excluded. 4. Subcutaneous edema of the dorsal forefoot. Electronically Signed   By: Harrietta Sherry M.D.   On: 11/05/2024 10:55   DG Tibia/Fibula Left Result Date: 11/03/2024 EXAM: 2 VIEW(S) XRAY OF THE LEFT TIBIA AND FIBULA 11/03/2024 06:29:37 PM COMPARISON: None available. CLINICAL HISTORY: pain/swelling FINDINGS: BONES AND JOINTS: No acute fracture. No malalignment. SOFT TISSUES: Vascular calcifications. Bimalleolar soft tissue swelling. IMPRESSION: 1. Bimalleolar soft tissue swelling. 2. Vascular calcifications. Electronically signed by: Dorethia Molt MD 11/03/2024 08:44 PM EST RP Workstation: [id number]   DG Foot Complete Left Result Date: 11/03/2024 EXAM: 3 OR MORE VIEW(S) XRAY OF THE LEFT FOOT 11/03/2024 06:29:37 PM COMPARISON: None available. CLINICAL HISTORY: pain/swelling FINDINGS: BONES AND JOINTS: No acute fracture. No malalignment. SOFT TISSUES: Dorsal forefoot soft tissue swelling. Vascular calcifications in the soft tissues. IMPRESSION: 1. Dorsal forefoot soft tissue swelling. 2. Vascular calcifications. Electronically signed by: Dorethia Molt MD 11/03/2024 08:43 PM EST RP Workstation: [id number]      Subjective: Patient seen and examined at bedside today.  Hemodynamically stable.  Lying on bed.  Remains confused but not agitated.  Denying any complaints .appears comfortable.  Medically stable for discharge to SNF  Discharge Exam: Vitals:   11/19/24 0400 11/19/24 0816  BP: 123/66 (!) 141/73  Pulse: 68 61  Resp: 18   Temp: 98.6 F (37 C) 98.5 F (36.9 C)   SpO2: 98% 97%   Vitals:  11/18/24 1526 11/18/24 2000 11/19/24 0400 11/19/24 0816  BP: 121/72 (!) 121/91 123/66 (!) 141/73  Pulse: 74 68 68 61  Resp: 20 17 18    Temp: 99.1 F (37.3 C) 98.3 F (36.8 C) 98.6 F (37 C) 98.5 F (36.9 C)  TempSrc: Oral Oral Oral Oral  SpO2: 100% 100% 98% 97%  Weight:      Height:        General: Pt is alert, awake, not in acute distress, pleasantly confused Cardiovascular: RRR, S1/S2 +, no rubs, no gallops Respiratory: CTA bilaterally, no wheezing, no rhonchi Abdominal: Soft, NT, ND, bowel sounds + Extremities: no edema, no cyanosis, dressing on the left foot    The results of significant diagnostics from this hospitalization (including imaging, microbiology, ancillary and laboratory) are listed below for reference.     Microbiology: Recent Results (from the past 240 hours)  Aerobic/Anaerobic Culture w Gram Stain (surgical/deep wound)     Status: None   Collection Time: 11/12/24 11:04 AM   Specimen: Foot, Right; Tissue  Result Value Ref Range Status   Specimen Description TISSUE  Final   Special Requests RIGHT FIFTH METATARSAL BASE RESECTION  Final   Gram Stain   Final    FEW WBC PRESENT,BOTH PMN AND MONONUCLEAR RARE GRAM POSITIVE COCCI Performed at [facility], [address], [place], [place] [address]    Culture   Final    FEW CORYNEBACTERIUM SPECIES CORYNEBACTERIUM JEIKEIUM Standardized susceptibility testing for this organism is not available. MIXED ANAEROBIC FLORA PRESENT.  CALL LAB IF FURTHER IID REQUIRED.    Report Status 11/17/2024 FINAL  Final     Labs: BNP (last 3 results) No results for input(s): BNP in the last 8760 hours. Basic Metabolic Panel: Recent Labs  Lab 11/13/24 0204 11/14/24 1320 11/15/24 0509 11/16/24 1043 11/17/24 0531  NA 131* 131* 129* 128* 132*  K 4.3 5.1 5.1 4.5 4.4  CL 98 96* 95* 92* 98  CO2 26 24 24 23 25   GLUCOSE 135* 114* 100* 252* 135*  BUN 23 21 18  28* 30*  CREATININE 1.08  0.99 0.99 1.05 1.10  CALCIUM  9.5 9.4 9.1 9.3 8.8*  MG 2.1 2.1 2.2 2.2 2.0  PHOS 2.7 2.4* 2.8 2.2* 3.6   Liver Function Tests: Recent Labs  Lab 11/13/24 0204 11/14/24 1320 11/15/24 0509 11/16/24 1043 11/17/24 0531  AST 19 24 28 24 19   ALT 10 12 9 12 11   ALKPHOS 57 66 63 64 52  BILITOT 0.3 0.5 0.4 0.3 0.3  PROT 7.1 7.9 7.4 7.6 6.7  ALBUMIN 3.4* 3.6 3.3* 3.5 3.3*   No results for input(s): LIPASE, AMYLASE in the last 168 hours. No results for input(s): AMMONIA in the last 168 hours. CBC: Recent Labs  Lab 11/13/24 0204 11/14/24 1320 11/15/24 0509 11/16/24 1043 11/17/24 0531  WBC 17.6* 18.4* 16.8* 15.4* 15.2*  NEUTROABS 8.8* 9.2* 9.2* 8.1* 7.3  HGB 10.3* 11.9* 12.5* 10.9* 9.5*  HCT 29.5* 34.7* 36.4* 30.9* 26.9*  MCV 79.1* 79.6* 80.2 78.6* 78.7*  PLT 530* 556* 551* 513* 487*   Cardiac Enzymes: No results for input(s): CKTOTAL, CKMB, CKMBINDEX, TROPONINI in the last 168 hours. BNP: Invalid input(s): POCBNP CBG: Recent Labs  Lab 11/17/24 2149 11/18/24 0757 11/18/24 1132 11/18/24 1543 11/18/24 2105  GLUCAP 104* 118* 174* 208* 168*   D-Dimer No results for input(s): DDIMER in the last 72 hours. Hgb A1c No results for input(s): HGBA1C in the last 72 hours. Lipid Profile No results for input(s):  CHOL, HDL, LDLCALC, TRIG, CHOLHDL, LDLDIRECT in the last 72 hours. Thyroid function studies No results for input(s): TSH, T4TOTAL, T3FREE, THYROIDAB in the last 72 hours.  Invalid input(s): FREET3 Anemia work up No results for input(s): VITAMINB12, FOLATE, FERRITIN, TIBC, IRON, RETICCTPCT in the last 72 hours. Urinalysis    Component Value Date/Time   COLORURINE STRAW (A) 10/23/2023 1137   APPEARANCEUR CLOUDY (A) 10/23/2023 1137   LABSPEC 1.020 10/23/2023 1137   PHURINE 8.0 10/23/2023 1137   GLUCOSEU NEGATIVE 10/23/2023 1137   HGBUR TRACE (A) 10/23/2023 1137   BILIRUBINUR NEGATIVE 10/23/2023 1137   KETONESUR  NEGATIVE 10/23/2023 1137   PROTEINUR 100 (A) 10/23/2023 1137   NITRITE NEGATIVE 10/23/2023 1137   LEUKOCYTESUR MODERATE (A) 10/23/2023 1137   Sepsis Labs Recent Labs  Lab 11/14/24 1320 11/15/24 0509 11/16/24 1043 11/17/24 0531  WBC 18.4* 16.8* 15.4* 15.2*   Microbiology Recent Results (from the past 240 hours)  Aerobic/Anaerobic Culture w Gram Stain (surgical/deep wound)     Status: None   Collection Time: 11/12/24 11:04 AM   Specimen: Foot, Right; Tissue  Result Value Ref Range Status   Specimen Description TISSUE  Final   Special Requests RIGHT FIFTH METATARSAL BASE RESECTION  Final   Gram Stain   Final    FEW WBC PRESENT,BOTH PMN AND MONONUCLEAR RARE GRAM POSITIVE COCCI Performed at [facility], [address] [place], [place] [address]    Culture   Final    FEW CORYNEBACTERIUM SPECIES CORYNEBACTERIUM JEIKEIUM Standardized susceptibility testing for this organism is not available. MIXED ANAEROBIC FLORA PRESENT.  CALL LAB IF FURTHER IID REQUIRED.    Report Status 11/17/2024 FINAL  Final    Please note: You were cared for by a hospitalist during your hospital stay. Once you are discharged, your primary care physician will handle any further medical issues. Please note that NO REFILLS for any discharge medications will be authorized once you are discharged, as it is imperative that you return to your primary care physician (or establish a relationship with a primary care physician if you do not have one) for your post hospital discharge needs so that they can reassess your need for medications and monitor your lab values.    Time coordinating discharge: 40 minutes  SIGNED:   Ivonne Mustache, MD  [facility] 11/19/2024, 11:39 AM Pager [phone]  If 7PM-7AM, please contact night-coverage [email or web address] Password [email or web address]    [1] No Known Allergies

## 2024-11-19 NOTE — Plan of Care (Signed)
   Problem: Education: Goal: Knowledge of General Education information will improve Description: Including pain rating scale, medication(s)/side effects and non-pharmacologic comfort measures Outcome: Progressing   Problem: Pain Managment: Goal: General experience of comfort will improve and/or be controlled Outcome: Progressing   Problem: Safety: Goal: Ability to remain free from injury will improve Outcome: Progressing

## 2024-11-19 NOTE — Progress Notes (Signed)
 [facility] Liaison Note  Received request from [facility] for hospice services at home after discharge. Spoke with patient's son [name] to initiate education related to hospice philosophy, services and team approach to care. Son verbalized understanding of information given. Per discussion, the plan is for discharge home by [facility] today.  DME needs discussed. Patient has the following equipment in the home: walker Family requests the following equipment for delivery: none at this time, nurse to assess needs.  Please send signed and completed DNR home with patient/family. Please provide prescriptions at discharge as needed to ensure ongoing symptom management.  [facility] information and contact numbers given to [place]. Please call with any concerns.  Thank you for the opportunity to participate in this patient's care.   Eleanor Nail, LPN [facility] Liaison [phone]

## 2024-11-19 NOTE — Progress Notes (Signed)
 Nutrition Follow-up  DOCUMENTATION CODES:   Not applicable  INTERVENTION:   -Continue dysphagia 3 diet with thin liquids -D/c Ensure Plus High Protein po BID, each supplement provides 350 kcal and 20 grams of protein  -Continue -1 packet Juven BID, each packet provides 95 calories, 2.5 grams of protein (collagen), and 9.8 grams of carbohydrate (3 grams sugar); also contains 7 grams of L-arginine and L-glutamine, 300 mg vitamin C , 15 mg vitamin E, 1.2 mcg vitamin B-12, 9.5 mg zinc , 200 mg calcium , and 1.5 g  Calcium  Beta-hydroxy-Beta-methylbutyrate to support wound healing  -Continue MVI with minerals daily -500 mg vitamin C  BID -220 mg zinc  sulfate daily x 14 days -RD will draw labs to assess for potential micronutrient deficiencies which may impede wound healing: vitamin A, copper, and CRP (to best interpret lab values)  -Feeding assistance with meals   NUTRITION DIAGNOSIS:   Increased nutrient needs related to wound healing as evidenced by estimated needs.  Ongoing  GOAL:   Patient will meet greater than or equal to 90% of their needs  Progressing   MONITOR:   PO intake, Supplement acceptance, Labs, Skin  REASON FOR ASSESSMENT:   Consult Wound healing  ASSESSMENT:   Pt with hx of dementia, DM type 2 with neuropathy, and PVD presented to ED with increased swelling and pain to the feet. Workup consistent with sepsis due to cellulitis.  12/29 - presented to [facility] 12/31 - transferred to [facility], MRI findings reveal abscess and osteomyelitis of the left calcaneus as well as concern for osteomyelitis of the right fifth metatarsal base and midshaft area 1/2- s/p angiogram- successful left lower extremity revascularization however high risk for major amputation 1/5- s/p s/p aortogram right lower extremity runoff with laser atherectomy of the right anterior tibial artery and balloon angioplasty; successful revascularization of the right anterior tibial artery;  patient now has a palpable right DP pulse.  1/7- s/p Procedure:  1.  Irrigation and debridement with fifth metatarsal base resection, right foot 2.  Application antibiotic beads with vancomycin  and tobramycin , right foot 3.  Application negative pressure wound therapy 4 x 3.5 x 2 cm, right foot  1/7- wound vac d/c, transitioned to wet to dry dressings due clotting in VAC tubing causing blockage 1/10- s/p BSE- dysphagia 3 diet with thin liquids   Reviewed I/O's: +1.4 L x 24 hours and -3.8 L since 11/05/25  UOP: 2.2 L x 24 hours  Per [facility] notes, patient with surgical wound to right lateral foot.   Per vascular and podiatry notes, left below the knee amputation was recommended, however, patient and family refused amputation due to advanced age and multiple co-morbidities.    Spoke with patient at bedside, who was pleasant and in good spirits today. Patient eager to engage RD in conversation, however, patient is very tangential and history unreliable due to history of dementia. Patient reports good appetite; noted patient consumed 100% of breakfast today. Documented meal completions 50-100%. Patient states he has been eating well and has not difficulty tolerating current diet texture. His only complaint is that it is difficult for him to reposition his tray table and it is often difficult to open containers, such as juice and applesauce. RD will order feeding assistance for tray set up.   Patient denies any weight loss. Reviewed weight history; weight has ranged from 77.1-81.6 kg over the past 3 months.   Discussed importance of good meal and supplement intake to promote healing. Patient reports appreciation for care  received. RD will discontinue Ensure due to improved oral intake and hyperglycemia. Vitamins and labs added to help support wound healing and help identify further nutritional deficiencies which may impede wound healing.   Palliative care following for goals of care; patient is now  DNR/ DNI with plan to continue conservative care with wound care to with foot and oral antibiotics. Plan to discharge home with hospice.   Medications reviewed and include lovenox , senokot, and miralax .   Lab Results  Component Value Date   HGBA1C 6.5 (H) 11/04/2024   PTA DM medications are 1000 mg metformin BID.   Labs reviewed: Na: 132, CBGS: 104-208 (inpatient orders for glycemic control are 0-5 units insulin  aspart daily at bedtime, 0-6 units insulin  aspart TID with meals).    NUTRITION - FOCUSED PHYSICAL EXAM:  Flowsheet Row Most Recent Value  Orbital Region No depletion  Upper Arm Region No depletion  Thoracic and Lumbar Region No depletion  Buccal Region No depletion  Temple Region No depletion  Clavicle Bone Region No depletion  Clavicle and Acromion Bone Region No depletion  Scapular Bone Region No depletion  Dorsal Hand Mild depletion  Patellar Region Mild depletion  Anterior Thigh Region Mild depletion  Posterior Calf Region Mild depletion  Edema (RD Assessment) Mild  Hair Reviewed  Eyes Reviewed  Mouth Reviewed  Skin Reviewed  Nails Reviewed    Diet Order:   Diet Order             DIET DYS 3 Room service appropriate? Yes; Fluid consistency: Thin  Diet effective now                   EDUCATION NEEDS:   Education needs have been addressed  Skin:  Skin Assessment: Skin Integrity Issues: Skin Integrity Issues:: Other (Comment), Stage II Stage II: left heel Other: surgical wound to right lateral foot  Last BM:  11/11/24  Height:   Ht Readings from Last 1 Encounters:  11/12/24 5' 8 (1.727 m)    Weight:   Wt Readings from Last 1 Encounters:  11/12/24 81.6 kg    Ideal Body Weight:  70 kg  BMI:  Body mass index is 27.35 kg/m.  Estimated Nutritional Needs:   Kcal:  1750-1950  Protein:  90-105 grams  Fluid:  1.7-1.9 L    Margery ORN, RD, LDN, CDCES Registered Dietitian III Certified Diabetes Care and Education Specialist If unable  to reach this RD, please use RD Inpatient group chat on secure chat between hours of 8am-4 pm daily

## 2024-12-02 ENCOUNTER — Ambulatory Visit: Payer: Self-pay | Admitting: Internal Medicine

## 2024-12-02 ENCOUNTER — Other Ambulatory Visit: Payer: Self-pay

## 2024-12-02 DIAGNOSIS — I739 Peripheral vascular disease, unspecified: Secondary | ICD-10-CM

## 2024-12-09 ENCOUNTER — Ambulatory Visit: Admitting: Podiatry

## 2024-12-24 ENCOUNTER — Ambulatory Visit (HOSPITAL_COMMUNITY)

## 2024-12-24 ENCOUNTER — Encounter: Admitting: Vascular Surgery

## 2024-12-25 ENCOUNTER — Ambulatory Visit: Admitting: Internal Medicine
